# Patient Record
Sex: Male | Born: 1935 | Race: White | Hispanic: No | Marital: Married | Smoking: Former smoker
Health system: Southern US, Community
[De-identification: ages and names within clinical notes are randomized; demographics above are authoritative.]

## PROBLEM LIST (undated history)

## (undated) DIAGNOSIS — K56609 Unspecified intestinal obstruction, unspecified as to partial versus complete obstruction: Secondary | ICD-10-CM

## (undated) DIAGNOSIS — I10 Essential (primary) hypertension: Secondary | ICD-10-CM

## (undated) DIAGNOSIS — E785 Hyperlipidemia, unspecified: Secondary | ICD-10-CM

## (undated) DIAGNOSIS — D649 Anemia, unspecified: Secondary | ICD-10-CM

## (undated) DIAGNOSIS — I679 Cerebrovascular disease, unspecified: Secondary | ICD-10-CM

## (undated) DIAGNOSIS — Z8673 Personal history of transient ischemic attack (TIA), and cerebral infarction without residual deficits: Secondary | ICD-10-CM

## (undated) DIAGNOSIS — F0390 Unspecified dementia without behavioral disturbance: Secondary | ICD-10-CM

## (undated) DIAGNOSIS — I2510 Atherosclerotic heart disease of native coronary artery without angina pectoris: Secondary | ICD-10-CM

## (undated) DIAGNOSIS — I714 Abdominal aortic aneurysm, without rupture, unspecified: Secondary | ICD-10-CM

## (undated) DIAGNOSIS — I482 Chronic atrial fibrillation, unspecified: Secondary | ICD-10-CM

## (undated) DIAGNOSIS — N400 Enlarged prostate without lower urinary tract symptoms: Secondary | ICD-10-CM

## (undated) DIAGNOSIS — S065X9A Traumatic subdural hemorrhage with loss of consciousness of unspecified duration, initial encounter: Secondary | ICD-10-CM

## (undated) DIAGNOSIS — S065XAA Traumatic subdural hemorrhage with loss of consciousness status unknown, initial encounter: Secondary | ICD-10-CM

## (undated) DIAGNOSIS — Z9181 History of falling: Secondary | ICD-10-CM

## (undated) DIAGNOSIS — K253 Acute gastric ulcer without hemorrhage or perforation: Secondary | ICD-10-CM

## (undated) DIAGNOSIS — M1990 Unspecified osteoarthritis, unspecified site: Secondary | ICD-10-CM

## (undated) DIAGNOSIS — I5022 Chronic systolic (congestive) heart failure: Secondary | ICD-10-CM

## (undated) DIAGNOSIS — I441 Atrioventricular block, second degree: Secondary | ICD-10-CM

## (undated) DIAGNOSIS — G4730 Sleep apnea, unspecified: Secondary | ICD-10-CM

## (undated) HISTORY — DX: Traumatic subdural hemorrhage with loss of consciousness of unspecified duration, initial encounter: S06.5X9A

## (undated) HISTORY — DX: Unspecified osteoarthritis, unspecified site: M19.90

## (undated) HISTORY — DX: Hyperlipidemia, unspecified: E78.5

## (undated) HISTORY — DX: Abdominal aortic aneurysm, without rupture: I71.4

## (undated) HISTORY — PX: GASTRECTOMY: SHX58

## (undated) HISTORY — DX: Traumatic subdural hemorrhage with loss of consciousness status unknown, initial encounter: S06.5XAA

## (undated) HISTORY — DX: Chronic systolic (congestive) heart failure: I50.22

## (undated) HISTORY — DX: Abdominal aortic aneurysm, without rupture, unspecified: I71.40

## (undated) HISTORY — DX: Benign prostatic hyperplasia without lower urinary tract symptoms: N40.0

## (undated) HISTORY — DX: History of falling: Z91.81

## (undated) HISTORY — DX: Cerebrovascular disease, unspecified: I67.9

## (undated) HISTORY — DX: Sleep apnea, unspecified: G47.30

## (undated) HISTORY — DX: Unspecified dementia, unspecified severity, without behavioral disturbance, psychotic disturbance, mood disturbance, and anxiety: F03.90

## (undated) HISTORY — DX: Atherosclerotic heart disease of native coronary artery without angina pectoris: I25.10

## (undated) HISTORY — DX: Atrioventricular block, second degree: I44.1

## (undated) HISTORY — DX: Chronic atrial fibrillation, unspecified: I48.20

## (undated) HISTORY — PX: KNEE ARTHROSCOPY: SHX127

## (undated) HISTORY — DX: Unspecified intestinal obstruction, unspecified as to partial versus complete obstruction: K56.609

## (undated) HISTORY — DX: Essential (primary) hypertension: I10

## (undated) HISTORY — PX: APPENDECTOMY: SHX54

## (undated) HISTORY — PX: CORONARY ARTERY BYPASS GRAFT: SHX141

## (undated) HISTORY — PX: FINGER SURGERY: SHX640

## (undated) HISTORY — DX: Acute gastric ulcer without hemorrhage or perforation: K25.3

## (undated) HISTORY — DX: Personal history of transient ischemic attack (TIA), and cerebral infarction without residual deficits: Z86.73

---

## 1997-07-14 ENCOUNTER — Encounter: Payer: Self-pay | Admitting: Pulmonary Disease

## 1997-09-14 ENCOUNTER — Ambulatory Visit: Admission: RE | Admit: 1997-09-14 | Discharge: 1997-09-14 | Payer: Self-pay | Admitting: Pulmonary Disease

## 1997-09-14 ENCOUNTER — Encounter: Payer: Self-pay | Admitting: Pulmonary Disease

## 1997-10-31 ENCOUNTER — Ambulatory Visit (HOSPITAL_COMMUNITY): Admission: RE | Admit: 1997-10-31 | Discharge: 1997-10-31 | Payer: Self-pay | Admitting: Urology

## 1999-02-04 ENCOUNTER — Inpatient Hospital Stay (HOSPITAL_COMMUNITY): Admission: EM | Admit: 1999-02-04 | Discharge: 1999-02-06 | Payer: Self-pay | Admitting: Gastroenterology

## 1999-02-04 ENCOUNTER — Encounter: Payer: Self-pay | Admitting: Gastroenterology

## 1999-02-05 ENCOUNTER — Encounter: Payer: Self-pay | Admitting: Gastroenterology

## 1999-04-24 ENCOUNTER — Ambulatory Visit (HOSPITAL_COMMUNITY): Admission: RE | Admit: 1999-04-24 | Discharge: 1999-04-25 | Payer: Self-pay | Admitting: Cardiology

## 1999-06-19 ENCOUNTER — Ambulatory Visit (HOSPITAL_COMMUNITY): Admission: RE | Admit: 1999-06-19 | Discharge: 1999-06-19 | Payer: Self-pay | Admitting: Cardiovascular Disease

## 1999-06-19 ENCOUNTER — Ambulatory Visit (HOSPITAL_COMMUNITY): Admission: RE | Admit: 1999-06-19 | Discharge: 1999-06-19 | Payer: Self-pay | Admitting: Cardiology

## 1999-07-30 ENCOUNTER — Encounter: Payer: Self-pay | Admitting: Cardiothoracic Surgery

## 1999-08-03 ENCOUNTER — Encounter (INDEPENDENT_AMBULATORY_CARE_PROVIDER_SITE_OTHER): Payer: Self-pay | Admitting: *Deleted

## 1999-08-03 ENCOUNTER — Inpatient Hospital Stay (HOSPITAL_COMMUNITY): Admission: RE | Admit: 1999-08-03 | Discharge: 1999-08-08 | Payer: Self-pay | Admitting: Cardiothoracic Surgery

## 1999-08-03 ENCOUNTER — Encounter: Payer: Self-pay | Admitting: Cardiothoracic Surgery

## 1999-08-04 ENCOUNTER — Encounter: Payer: Self-pay | Admitting: Cardiothoracic Surgery

## 1999-08-05 ENCOUNTER — Encounter: Payer: Self-pay | Admitting: Cardiothoracic Surgery

## 1999-08-06 ENCOUNTER — Encounter: Payer: Self-pay | Admitting: Cardiothoracic Surgery

## 2001-05-17 ENCOUNTER — Encounter: Payer: Self-pay | Admitting: *Deleted

## 2001-05-17 ENCOUNTER — Ambulatory Visit (HOSPITAL_COMMUNITY): Admission: RE | Admit: 2001-05-17 | Discharge: 2001-05-17 | Payer: Self-pay | Admitting: *Deleted

## 2001-09-06 ENCOUNTER — Encounter: Payer: Self-pay | Admitting: Urology

## 2001-09-06 ENCOUNTER — Encounter: Admission: RE | Admit: 2001-09-06 | Discharge: 2001-09-06 | Payer: Self-pay | Admitting: Urology

## 2001-12-05 ENCOUNTER — Encounter: Payer: Self-pay | Admitting: Internal Medicine

## 2001-12-05 ENCOUNTER — Inpatient Hospital Stay (HOSPITAL_COMMUNITY): Admission: EM | Admit: 2001-12-05 | Discharge: 2001-12-08 | Payer: Self-pay | Admitting: Emergency Medicine

## 2001-12-06 ENCOUNTER — Encounter: Payer: Self-pay | Admitting: Gastroenterology

## 2001-12-07 ENCOUNTER — Encounter: Payer: Self-pay | Admitting: General Surgery

## 2001-12-08 ENCOUNTER — Encounter: Payer: Self-pay | Admitting: Gastroenterology

## 2002-01-12 ENCOUNTER — Encounter (INDEPENDENT_AMBULATORY_CARE_PROVIDER_SITE_OTHER): Payer: Self-pay | Admitting: Specialist

## 2002-01-12 ENCOUNTER — Observation Stay (HOSPITAL_COMMUNITY): Admission: RE | Admit: 2002-01-12 | Discharge: 2002-01-13 | Payer: Self-pay | Admitting: Orthopedic Surgery

## 2003-10-11 ENCOUNTER — Inpatient Hospital Stay (HOSPITAL_COMMUNITY): Admission: EM | Admit: 2003-10-11 | Discharge: 2003-10-16 | Payer: Self-pay | Admitting: Emergency Medicine

## 2004-04-17 ENCOUNTER — Ambulatory Visit: Payer: Self-pay

## 2004-04-17 ENCOUNTER — Ambulatory Visit: Payer: Self-pay | Admitting: Cardiology

## 2004-05-27 ENCOUNTER — Ambulatory Visit: Payer: Self-pay

## 2004-05-27 ENCOUNTER — Ambulatory Visit: Payer: Self-pay | Admitting: Cardiology

## 2004-09-16 ENCOUNTER — Observation Stay (HOSPITAL_COMMUNITY): Admission: EM | Admit: 2004-09-16 | Discharge: 2004-09-17 | Payer: Self-pay | Admitting: Emergency Medicine

## 2004-09-16 ENCOUNTER — Ambulatory Visit: Payer: Self-pay | Admitting: Internal Medicine

## 2004-11-12 ENCOUNTER — Ambulatory Visit: Payer: Self-pay | Admitting: Cardiology

## 2004-11-18 ENCOUNTER — Ambulatory Visit: Payer: Self-pay | Admitting: *Deleted

## 2005-07-07 ENCOUNTER — Ambulatory Visit: Payer: Self-pay

## 2005-07-07 ENCOUNTER — Ambulatory Visit: Payer: Self-pay | Admitting: Cardiology

## 2005-07-08 ENCOUNTER — Ambulatory Visit: Payer: Self-pay | Admitting: Cardiology

## 2005-09-27 ENCOUNTER — Ambulatory Visit: Payer: Self-pay | Admitting: Cardiology

## 2005-10-07 ENCOUNTER — Encounter: Admission: RE | Admit: 2005-10-07 | Discharge: 2005-10-07 | Payer: Self-pay | Admitting: *Deleted

## 2005-10-08 ENCOUNTER — Ambulatory Visit (HOSPITAL_COMMUNITY): Admission: RE | Admit: 2005-10-08 | Discharge: 2005-10-08 | Payer: Self-pay | Admitting: Orthopedic Surgery

## 2005-12-24 ENCOUNTER — Inpatient Hospital Stay (HOSPITAL_COMMUNITY): Admission: EM | Admit: 2005-12-24 | Discharge: 2005-12-25 | Payer: Self-pay | Admitting: Emergency Medicine

## 2005-12-28 ENCOUNTER — Ambulatory Visit: Payer: Self-pay | Admitting: Internal Medicine

## 2006-01-12 ENCOUNTER — Ambulatory Visit: Payer: Self-pay | Admitting: Cardiology

## 2006-01-13 ENCOUNTER — Ambulatory Visit: Payer: Self-pay

## 2006-05-05 ENCOUNTER — Encounter: Admission: RE | Admit: 2006-05-05 | Discharge: 2006-05-05 | Payer: Self-pay | Admitting: *Deleted

## 2006-06-29 ENCOUNTER — Ambulatory Visit: Payer: Self-pay

## 2006-06-29 ENCOUNTER — Ambulatory Visit: Payer: Self-pay | Admitting: Cardiology

## 2006-08-11 ENCOUNTER — Ambulatory Visit: Payer: Self-pay

## 2006-08-11 ENCOUNTER — Ambulatory Visit: Payer: Self-pay | Admitting: Cardiology

## 2006-08-11 LAB — CONVERTED CEMR LAB
CO2: 28 meq/L (ref 19–32)
Creatinine, Ser: 0.9 mg/dL (ref 0.4–1.5)
GFR calc Af Amer: 105 mL/min
HDL: 55.1 mg/dL (ref >39.0)
Potassium: 3.8 meq/L (ref 3.5–5.1)
Sodium: 141 meq/L (ref 135–145)
Total Bilirubin: 1.1 mg/dL (ref 0.3–1.2)
Total CHOL/HDL Ratio: 2.4
Total Protein: 5.9 g/dL — ABNORMAL LOW (ref 6.0–8.3)
Triglycerides: 49 mg/dL (ref 0–149)
VLDL: 10 mg/dL (ref 0–40)

## 2006-11-17 ENCOUNTER — Ambulatory Visit: Payer: Self-pay | Admitting: *Deleted

## 2006-11-17 ENCOUNTER — Encounter: Admission: RE | Admit: 2006-11-17 | Discharge: 2006-11-17 | Payer: Self-pay | Admitting: *Deleted

## 2007-01-25 ENCOUNTER — Ambulatory Visit: Payer: Self-pay

## 2007-02-15 ENCOUNTER — Ambulatory Visit: Payer: Self-pay | Admitting: Cardiology

## 2007-05-25 ENCOUNTER — Encounter: Admission: RE | Admit: 2007-05-25 | Discharge: 2007-05-25 | Payer: Self-pay | Admitting: *Deleted

## 2007-05-25 ENCOUNTER — Ambulatory Visit: Payer: Self-pay | Admitting: *Deleted

## 2007-07-27 ENCOUNTER — Ambulatory Visit: Payer: Self-pay

## 2007-07-27 LAB — CONVERTED CEMR LAB
Alkaline Phosphatase: 55 units/L (ref 39–117)
BUN: 22 mg/dL (ref 6–23)
Basophils Absolute: 0 10*3/uL (ref 0.0–0.1)
Basophils Relative: 0.4 % (ref 0.0–1.0)
CO2: 26 meq/L (ref 19–32)
Cholesterol: 122 mg/dL (ref 0–200)
Eosinophils Absolute: 0.2 10*3/uL (ref 0.0–0.6)
GFR calc Af Amer: 83 mL/min
GFR calc non Af Amer: 69 mL/min
HDL: 55.2 mg/dL (ref >39.0)
Hemoglobin: 12.6 g/dL — ABNORMAL LOW (ref 13.0–17.0)
Lymphocytes Relative: 26.2 % (ref 12.0–46.0)
MCHC: 32.3 g/dL (ref 30.0–36.0)
MCV: 94.9 fL (ref 78.0–100.0)
Monocytes Absolute: 0.8 10*3/uL — ABNORMAL HIGH (ref 0.2–0.7)
Monocytes Relative: 11.8 % — ABNORMAL HIGH (ref 3.0–11.0)
Neutro Abs: 3.7 10*3/uL (ref 1.4–7.7)
Potassium: 4.3 meq/L (ref 3.5–5.1)
TSH: 2.08 microintl units/mL (ref 0.35–5.50)
Total Protein: 6.3 g/dL (ref 6.0–8.3)
Triglycerides: 31 mg/dL (ref 0–149)

## 2007-09-06 ENCOUNTER — Ambulatory Visit: Payer: Self-pay | Admitting: Cardiology

## 2007-11-16 ENCOUNTER — Encounter: Admission: RE | Admit: 2007-11-16 | Discharge: 2007-11-16 | Payer: Self-pay | Admitting: *Deleted

## 2007-11-16 ENCOUNTER — Ambulatory Visit: Payer: Self-pay | Admitting: *Deleted

## 2008-01-19 DIAGNOSIS — M1990 Unspecified osteoarthritis, unspecified site: Secondary | ICD-10-CM | POA: Insufficient documentation

## 2008-01-19 DIAGNOSIS — I714 Abdominal aortic aneurysm, without rupture, unspecified: Secondary | ICD-10-CM | POA: Insufficient documentation

## 2008-01-19 DIAGNOSIS — I679 Cerebrovascular disease, unspecified: Secondary | ICD-10-CM

## 2008-01-19 DIAGNOSIS — I10 Essential (primary) hypertension: Secondary | ICD-10-CM | POA: Insufficient documentation

## 2008-01-19 DIAGNOSIS — Z87898 Personal history of other specified conditions: Secondary | ICD-10-CM

## 2008-01-19 DIAGNOSIS — E785 Hyperlipidemia, unspecified: Secondary | ICD-10-CM

## 2008-01-19 DIAGNOSIS — Z8679 Personal history of other diseases of the circulatory system: Secondary | ICD-10-CM | POA: Insufficient documentation

## 2008-01-22 ENCOUNTER — Ambulatory Visit: Payer: Self-pay | Admitting: Pulmonary Disease

## 2008-01-22 DIAGNOSIS — G4733 Obstructive sleep apnea (adult) (pediatric): Secondary | ICD-10-CM | POA: Insufficient documentation

## 2008-01-22 DIAGNOSIS — Z9989 Dependence on other enabling machines and devices: Secondary | ICD-10-CM

## 2008-02-20 ENCOUNTER — Ambulatory Visit: Payer: Self-pay | Admitting: Cardiology

## 2008-02-20 LAB — CONVERTED CEMR LAB
Chloride: 104 meq/L (ref 96–112)
Eosinophils Relative: 3 % (ref 0–5)
HCT: 39.8 % (ref 39.0–52.0)
INR: 1.1 (ref 0.0–1.5)
Lymphocytes Relative: 22 % (ref 12–46)
Lymphs Abs: 1.9 10*3/uL (ref 0.7–4.0)
Neutrophils Relative %: 62 % (ref 43–77)
Platelets: 167 10*3/uL (ref 150–400)
Potassium: 4.4 meq/L (ref 3.5–5.3)
Prothrombin Time: 14.7 s (ref 11.6–15.2)
RBC: 4.23 M/uL (ref 4.22–5.81)
WBC: 8.5 10*3/uL (ref 4.0–10.5)

## 2008-02-21 ENCOUNTER — Ambulatory Visit (HOSPITAL_COMMUNITY): Admission: RE | Admit: 2008-02-21 | Discharge: 2008-02-21 | Payer: Self-pay | Admitting: Cardiology

## 2008-02-21 ENCOUNTER — Ambulatory Visit: Payer: Self-pay | Admitting: Cardiology

## 2008-03-27 ENCOUNTER — Ambulatory Visit: Payer: Self-pay | Admitting: Pulmonary Disease

## 2008-05-23 ENCOUNTER — Ambulatory Visit: Payer: Self-pay | Admitting: *Deleted

## 2008-05-23 ENCOUNTER — Encounter: Admission: RE | Admit: 2008-05-23 | Discharge: 2008-05-23 | Payer: Self-pay | Admitting: *Deleted

## 2008-09-30 ENCOUNTER — Ambulatory Visit: Payer: Self-pay | Admitting: *Deleted

## 2008-11-21 ENCOUNTER — Encounter: Admission: RE | Admit: 2008-11-21 | Discharge: 2008-11-21 | Payer: Self-pay | Admitting: *Deleted

## 2008-11-21 ENCOUNTER — Ambulatory Visit: Payer: Self-pay | Admitting: *Deleted

## 2009-02-11 ENCOUNTER — Encounter: Payer: Self-pay | Admitting: Cardiology

## 2009-02-11 ENCOUNTER — Ambulatory Visit: Payer: Self-pay

## 2009-02-11 ENCOUNTER — Ambulatory Visit: Payer: Self-pay | Admitting: Cardiovascular Disease

## 2009-03-06 ENCOUNTER — Ambulatory Visit: Payer: Self-pay | Admitting: Cardiology

## 2009-03-06 DIAGNOSIS — I25810 Atherosclerosis of coronary artery bypass graft(s) without angina pectoris: Secondary | ICD-10-CM

## 2009-05-08 ENCOUNTER — Ambulatory Visit: Payer: Self-pay | Admitting: Internal Medicine

## 2009-05-08 ENCOUNTER — Telehealth: Payer: Self-pay | Admitting: Cardiology

## 2009-05-08 ENCOUNTER — Encounter: Payer: Self-pay | Admitting: Cardiology

## 2009-05-13 LAB — CONVERTED CEMR LAB
CO2: 23 meq/L (ref 19–32)
Calcium: 8.7 mg/dL (ref 8.4–10.5)
Chloride: 107 meq/L (ref 96–112)
Glucose, Bld: 96 mg/dL (ref 70–99)
Sodium: 141 meq/L (ref 135–145)

## 2009-05-14 ENCOUNTER — Ambulatory Visit: Payer: Self-pay | Admitting: Cardiology

## 2009-05-14 ENCOUNTER — Encounter: Payer: Self-pay | Admitting: Cardiology

## 2009-07-17 ENCOUNTER — Encounter: Payer: Self-pay | Admitting: Cardiology

## 2009-07-29 ENCOUNTER — Telehealth (INDEPENDENT_AMBULATORY_CARE_PROVIDER_SITE_OTHER): Payer: Self-pay | Admitting: *Deleted

## 2009-07-30 ENCOUNTER — Encounter (HOSPITAL_COMMUNITY): Admission: RE | Admit: 2009-07-30 | Discharge: 2009-10-07 | Payer: Self-pay | Admitting: Cardiology

## 2009-07-30 ENCOUNTER — Ambulatory Visit: Payer: Self-pay

## 2009-07-30 ENCOUNTER — Ambulatory Visit: Payer: Self-pay | Admitting: Cardiology

## 2009-07-30 DIAGNOSIS — I452 Bifascicular block: Secondary | ICD-10-CM | POA: Insufficient documentation

## 2009-09-12 ENCOUNTER — Encounter: Payer: Self-pay | Admitting: Cardiology

## 2009-09-15 ENCOUNTER — Telehealth (INDEPENDENT_AMBULATORY_CARE_PROVIDER_SITE_OTHER): Payer: Self-pay | Admitting: *Deleted

## 2009-09-24 ENCOUNTER — Encounter: Payer: Self-pay | Admitting: Cardiology

## 2009-12-04 ENCOUNTER — Telehealth: Payer: Self-pay | Admitting: Cardiology

## 2009-12-09 ENCOUNTER — Telehealth: Payer: Self-pay | Admitting: Cardiology

## 2010-01-06 ENCOUNTER — Encounter: Payer: Self-pay | Admitting: Cardiology

## 2010-01-14 ENCOUNTER — Ambulatory Visit: Payer: Self-pay | Admitting: Cardiovascular Disease

## 2010-01-21 ENCOUNTER — Encounter: Payer: Self-pay | Admitting: Internal Medicine

## 2010-01-23 ENCOUNTER — Ambulatory Visit: Payer: Self-pay | Admitting: Cardiology

## 2010-01-23 ENCOUNTER — Ambulatory Visit (HOSPITAL_COMMUNITY): Admission: RE | Admit: 2010-01-23 | Discharge: 2010-01-24 | Payer: Self-pay | Admitting: Internal Medicine

## 2010-01-24 HISTORY — PX: PACEMAKER INSERTION: SHX728

## 2010-01-27 ENCOUNTER — Encounter: Payer: Self-pay | Admitting: Cardiology

## 2010-01-27 ENCOUNTER — Ambulatory Visit: Payer: Self-pay | Admitting: Cardiovascular Disease

## 2010-02-03 ENCOUNTER — Ambulatory Visit: Payer: Self-pay | Admitting: Cardiology

## 2010-02-03 ENCOUNTER — Encounter: Payer: Self-pay | Admitting: Internal Medicine

## 2010-02-03 DIAGNOSIS — Z950 Presence of cardiac pacemaker: Secondary | ICD-10-CM

## 2010-02-03 DIAGNOSIS — I442 Atrioventricular block, complete: Secondary | ICD-10-CM

## 2010-03-11 ENCOUNTER — Emergency Department (HOSPITAL_COMMUNITY)
Admission: EM | Admit: 2010-03-11 | Discharge: 2010-03-11 | Payer: Self-pay | Source: Home / Self Care | Admitting: Emergency Medicine

## 2010-03-16 ENCOUNTER — Encounter: Payer: Self-pay | Admitting: Cardiology

## 2010-03-19 ENCOUNTER — Ambulatory Visit: Payer: Self-pay | Admitting: Cardiology

## 2010-03-25 ENCOUNTER — Ambulatory Visit: Admission: RE | Admit: 2010-03-25 | Discharge: 2010-03-25 | Payer: Self-pay | Admitting: Cardiology

## 2010-04-10 ENCOUNTER — Encounter: Payer: Self-pay | Admitting: Cardiology

## 2010-04-29 ENCOUNTER — Ambulatory Visit: Payer: Self-pay | Admitting: Internal Medicine

## 2010-06-23 ENCOUNTER — Telehealth: Payer: Self-pay | Admitting: Cardiology

## 2010-06-24 ENCOUNTER — Encounter: Payer: Medicare Other | Admitting: Cardiology

## 2010-06-24 ENCOUNTER — Encounter: Payer: Self-pay | Admitting: Cardiology

## 2010-06-28 ENCOUNTER — Encounter: Payer: Self-pay | Admitting: Vascular Surgery

## 2010-06-28 ENCOUNTER — Encounter: Payer: Self-pay | Admitting: *Deleted

## 2010-07-02 ENCOUNTER — Encounter: Payer: Self-pay | Admitting: Cardiology

## 2010-07-05 LAB — CONVERTED CEMR LAB
BUN: 26 mg/dL — ABNORMAL HIGH (ref 6–23)
Basophils Absolute: 0 10*3/uL (ref 0.0–0.1)
Basophils Relative: 0 % (ref 0–1)
CO2: 23 meq/L (ref 19–32)
CO2: 27 meq/L (ref 19–32)
Chloride: 104 meq/L (ref 96–112)
Cholesterol: 123 mg/dL (ref 0–200)
Creatinine, Ser: 0.91 mg/dL (ref 0.40–1.50)
Creatinine, Ser: 1 mg/dL (ref 0.4–1.5)
Eosinophils Absolute: 0.1 10*3/uL (ref 0.0–0.7)
Eosinophils Absolute: 0.3 10*3/uL (ref 0.0–0.7)
Glucose, Bld: 148 mg/dL — ABNORMAL HIGH (ref 70–99)
Glucose, Bld: 95 mg/dL (ref 70–99)
Hemoglobin: 12.5 g/dL — ABNORMAL LOW (ref 13.0–17.0)
INR: 1.2 — ABNORMAL HIGH (ref 0.8–1.0)
Lymphs Abs: 1.5 10*3/uL (ref 0.7–4.0)
MCHC: 30.9 g/dL (ref 30.0–36.0)
MCHC: 33.2 g/dL (ref 30.0–36.0)
MCV: 95.5 fL (ref 78.0–100.0)
MCV: 96.2 fL (ref 78.0–100.0)
Monocytes Absolute: 0.6 10*3/uL (ref 0.1–1.0)
Monocytes Absolute: 0.8 10*3/uL (ref 0.1–1.0)
Monocytes Relative: 10 % (ref 3–12)
Neutrophils Relative %: 69.2 % (ref 43.0–77.0)
Platelets: 150 10*3/uL (ref 150.0–400.0)
Prothrombin Time: 13.3 s — ABNORMAL HIGH (ref 9.7–11.8)
RBC: 4.2 M/uL — ABNORMAL LOW (ref 4.22–5.81)
RDW: 14.2 % (ref 11.5–14.6)
Total Bilirubin: 0.6 mg/dL (ref 0.3–1.2)
Total Protein: 6.6 g/dL (ref 6.0–8.3)
Triglycerides: 42 mg/dL (ref <150)
VLDL: 8 mg/dL (ref 0–40)
WBC: 7.9 10*3/uL (ref 4.5–10.5)

## 2010-07-08 ENCOUNTER — Encounter: Payer: Medicare Other | Admitting: Cardiology

## 2010-07-09 NOTE — Assessment & Plan Note (Signed)
Summary: 6wk f/u sl    Visit Type:  6 wk f/u Referring Provider:  Alonza Smoker Primary Provider:  Valera Castle  CC:  pt offers no cardiac complaints today....pt fell last Wed.......  History of Present Illness: Eric [name] comes in today for followup of his atrial fibrillation.  Since his pacemaker was implanted, he has remained in atrial fibrillation. His stamina and his shortness of breath with exertion have improved. He denies any presyncope or syncope.  He did have a fall last week as planned. He hit his face and hurt his right shoulder. He went to the emergency room where he was checked out. There were no fractures.  A chest x-ray was done which showed a small nodule. Followup was recommended.  He's had no problems with anticoagulation. He remains on Pradaxa.    Current Medications (verified): 1)  Aspirin 81 Mg Tbec (Aspirin) .... Take One Tablet By Mouth Daily 2)  Multivitamins   Tabs (Multiple Vitamin) .... Take 1 Tablet By Mouth Once A Day 3)  Accupril 20 Mg Tabs (Quinapril Hcl) .... Take One Tablet By Mouth Daily 4)  Norvasc 5 Mg Tabs (Amlodipine Besylate) .... Take One Tablet By Mouth Daily 5)  Zocor 20 Mg Tabs (Simvastatin) .... Take One Tablet By Mouth Daily 6)  Naprosyn 500 Mg  Tabs (Naproxen) .... Take 1 Tablet By Mouth Two Times A Day 7)  Protonix 40 Mg  Tbec (Pantoprazole Sodium) .... Take 1 Tablet By Mouth Once A Day 8)  Foltx 2.5-25-2 Mg  Tabs (Fa-Pyridoxine-Cyancobalamin) .... Take 1 Tablet By Mouth Once A Day 9)  Enablex 15 Mg Xr24h-Tab (Darifenacin Hydrobromide) .Marland Kitchen.. 1 Once Daily 10)  Pradaxa 150 Mg Caps (Dabigatran Etexilate Mesylate) .... Take 1 Tablet By Mouth Two Times A Day 11)  Finasteride 5 Mg Tabs (Finasteride) .... Take One Tablet By Mouth Daily  Allergies: 1)  ! Penicillin 2)  ! Morphine  Past History:  Past Medical History: Last updated: 01/19/2008 Current Problems:  BENIGN PROSTATIC HYPERTROPHY, HX OF (ICD-V13.8) DEGENERATIVE JOINT  DISEASE (ICD-715.90) TRANSIENT ISCHEMIC ATTACKS, HX OF (ICD-V12.50) ABDOMINAL AORTIC ANEURYSM (ICD-441.4) CEREBROVASCULAR DISEASE (ICD-437.9) HYPERLIPIDEMIA (ICD-272.4) HYPERTENSION (ICD-401.9) SINUS BRADYCARDIA (ICD-427.81) CORONARY ARTERY DISEASE (ICD-414.00)    Past Surgical History: Last updated: 02/03/2010 Pacemaker.Marland KitchenMarland KitchenAdapta Medtronic dual chamber pacemaker....8/20/1..Dr. Hillis [name] CABG Appendectomy Gastrectomy  Family History: Last updated: 03/06/2009 Family history of CAD; colon cancer   Social History: Last updated: 03/06/2009 Retired  Married  Tobacco Use - No.  Alcohol Use - no Smoking Status:  never  Review of Systems       negative other than history of present illness  Vital Signs:  Patient profile:   77 year old male Height:      70 inches Weight:      174.8 pounds BMI:     25.17 Pulse rate:   84 / minute Pulse rhythm:   irregular BP sitting:   102 / 60  (left arm) Cuff size:   large  Vitals Entered By: Danielle Rankin, CMA (March 19, 2010 1:48 PM)  Physical Exam  General:  Well developed, well nourished, in no acute distress. Head:  normocephalic and atraumatic Eyes:  left supraorbital bruise Neck:  Neck supple, no JVD. No masses, thyromegaly or abnormal cervical nodes. Lungs:  Clear bilaterally to auscultation and percussion. Heart:  PMI nondisplaced, regular rate and rhythm, normal S1-S2, carotids full Msk:  decreased ROM.   Pulses:  pulses normal in all 4 extremities Extremities:  No clubbing or cyanosis.  Neurologic:  Alert and oriented x 3. Skin:  Intact without lesions or rashes. Psych:  Normal affect.   PPM Specifications Following MD:  Eric Range, MD     PPM Vendor:  Medtronic     PPM Model Number:  [id number]     PPM Serial Number:  [id number] PPM DOI:  01/23/2010     PPM Implanting MD:  Eric Range, MD  Lead 1    Location: [facility]     DOI: 01/23/2010     Model #: [id number]     Serial #: [id number]     Status: active Lead 2    Location:  [facility]     DOI: 01/23/2010     Model #: 6213     Serial #: [id number]     Status: active  Magnet Response Rate:  BOL 85 ERI  65  Indications:  426.12   PPM Follow Up Battery Voltage:  2.80 V     Battery Est. Longevity:  10.5 YRS     Pacer Dependent:  Yes      Episodes Coumadin:  No  Parameters Mode:  DDD     Lower Rate Limit:  60     Upper Rate Limit:  130 Paced AV Delay:  180     Sensed AV Delay:  150 Next Cardiology Appt Due:  04/29/2010 Tech Comments:  INTERROGATION ONLY---PT IN AF 98.8% OF TIME.  PT TO BE SET UP FOR CARDIOVERSION. ROV 04-29-10 W/JA. Vella Kohler  Impression & Recommendations:  Problem # 1:  ATRIAL FIBRILLATION (ICD-427.31)  His pacer interrogation shows him to be in atrial fibrillation 99% of the time. I'll arrange for him to have an outpatient cardioversion. I placed a call into Dr. Johney Frame to see if we need an antiarrhythmic. This will be arranged as an outpatient next Wednesday the 19th. Indications risks and benefit has been discussed. Preprocedural blood work be drawn today. His updated medication list for this problem includes:    Aspirin 81 Mg Tbec (Aspirin) .Marland Kitchen... Take one tablet by mouth daily  Orders: EKG w/ Interpretation (93000) TLB-BMP (Basic Metabolic Panel-BMET) (80048-METABOL) TLB-CBC Platelet - w/Differential (85025-CBCD) TLB-PT (Protime) (85610-PTP) TLB-PTT (85730-PTTL) Cardioversion (Cardioversion)  Problem # 2:  PACEMAKER, PERMANENT (ICD-V45.01) Assessment: Unchanged  Problem # 3:  MOBITZ II ATRIOVENTRICULAR BLOCK (ICD-426.12)  His updated medication list for this problem includes:    Aspirin 81 Mg Tbec (Aspirin) .Marland Kitchen... Take one tablet by mouth daily    Accupril 20 Mg Tabs (Quinapril hcl) .Marland Kitchen... Take one tablet by mouth daily    Norvasc 5 Mg Tabs (Amlodipine besylate) .Marland Kitchen... 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 Take one tablet by mouth daily    Accupril 20 Mg Tabs (Quinapril hcl) .Marland Kitchen... Take one tablet by mouth daily    Norvasc 5 Mg Tabs (Amlodipine besylate) .Marland Kitchen... 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 Take one tablet by mouth daily    Accupril 20 Mg Tabs (Quinapril hcl) .Marland Kitchen... Take one tablet by mouth daily    Norvasc 5 Mg Tabs (Amlodipine besylate) .Marland Kitchen... Take one tablet by mouth daily  Patient Instructions: 1)  Your physician recommends that you schedule a follow-up appointment in: 3  weeks 2)  Your physician recommends that you continue on your current medications as directed. Please refer to the Current Medication list given to you today. 3)  Your physician has recommended that you have a cardioversion (DCCV).  Electrical cardioversion uses a jolt of electricity to your heart either through paddles or wired patches attached to your chest. This is a controlled, usually prescheduled, procedure. Defibrillation is done under light anesthesia in the hospital, and you usually go home the day of the procedure. This is done to get your heart back into a normal rhythm. You are not awake for the procedure. Please see the instruction sheet given to you today.

## 2010-07-09 NOTE — Progress Notes (Signed)
Summary: talk to nurse    Phone Note From Other Clinic Call back at [facility] Phone [phone]   Caller: Nurse Summary of Call: Per Raquel Sarna from cardiac rehab pt has a huge bruise on his arm. call pt and/or wife to see if pt needs to be seen. Nurse feels he may need to be seen.  Initial call taken by: Edman Circle,  December 09, 2009 11:00 AM  Follow-up for Phone Call        [facility]. Sherri Rad, RN, BSN  December 09, 2009 11:47 AM   Mrs. Carmean called back and stated she took Mr. Gervacio to our [facility] office and had Dr. Mariah Milling look at his arm. He did have a knot, but Dr. Mariah Milling reassured them he did not think this was a blood clot. She states Dr. Daleen Squibb has also called them from the hospital to check on them. She just wanted to let us know.  Follow-up by: Sherri Rad, RN, BSN,  December 09, 2009 12:59 PM

## 2010-07-09 NOTE — Progress Notes (Signed)
Summary: pt needs order to start PT   Phone Note From Other Clinic Call back at [phone]   Caller: [facility] PT Request: Talk with Nurse, Talk with Provider Summary of Call: pt needs a order set to [phone] in order for him to start PT on tomorrow Initial call taken by: Omer Jack,  June 23, 2010 1:04 PM  Follow-up for Phone Call        Order for PT faxed. [name] RN Follow-up by: Lisabeth Devoid RN,  June 24, 2010 3:38 PM

## 2010-07-09 NOTE — Cardiovascular Report (Signed)
Summary: Pre Op Orders   Pre Op Orders   Imported By: Roderic Ovens 01/22/2010 15:11:25  _____________________________________________________________________  External Attachment:    Type:   Image     Comment:   External Document

## 2010-07-09 NOTE — Medication Information (Signed)
Summary: Physician's Order  Physician's Order   Imported By: Roderic Ovens 11/24/2009 15:04:02  _____________________________________________________________________  External Attachment:    Type:   Image     Comment:   External Document

## 2010-07-09 NOTE — Assessment & Plan Note (Signed)
Summary: Cardiology Nuclear Study  Nuclear Med Background Indications for Stress Test: Evaluation for Ischemia, Graft Patency   History: CABG, Heart Catheterization, Myocardial Perfusion Study  History Comments: '01 re-do CABG x 3; '02 Cath:patent grafts, EF=60%;2/09 HYQ:MVHQIO, EF=60%; AAA (3.6 cm); h/o OSA   Symptoms Comments: No complaints   Nuclear Pre-Procedure Cardiac Risk Factors: Carotid Disease, CVA, Family History - CAD, History of Smoking, Hypertension, Lipids, PVD, RBBB, TIA Caffeine/Decaff Intake: None NPO After: 7:00 PM Lungs: Clear IV 0.9% NS with Angio Cath: 22g     IV Site: (R) AC IV Started by: Irean Hong RN Chest Size (in) 40     Height (in): 70 Weight (lb): 173 BMI: 24.91  Nuclear Med Study 1 or 2 day study:  1 day     Stress Test Type:  Eugenie Birks Reading MD:  Willa Rough, MD     Referring MD:  Valera Castle, MD Resting Radionuclide:  Technetium 51m Tetrofosmin     Resting Radionuclide Dose:  11.0 mCi  Stress Radionuclide:  Technetium 11m Tetrofosmin     Stress Radionuclide Dose:  33.0 mCi   Stress Protocol   Lexiscan: 0.4 mg   Stress Test Technologist:  Rea College CMA-N     Nuclear Technologist:  Burna Mortimer Deal RT-N  Rest Procedure  Myocardial perfusion imaging was performed at rest 45 minutes following the intravenous administration of Myoview Technetium 85m Tetrofosmin.  Stress Procedure  The patient initially walked the treadmill utilizing the Bruce protocol for 3:41, but was unable to keep his heart rate up.  There were occasional PAC's and questionable afib with exercise.  He then received IV Lexiscan 0.4 mg over 15-seconds.  Myoview injected at 30-seconds.  There were no significant changes with lexiscan, occasional PVC's.  Quantitative spect images were obtained after a 45 minute delay.  QPS Raw Data Images:  Normal; no motion artifact; normal heart/lung ratio. Stress Images:  No diagnostic abnormalities Rest Images:  Same as  stress Subtraction (SDS):  No evidence of ischemia. Transient Ischemic Dilatation:  1.02  (Normal <1.22)  Lung/Heart Ratio:  .28  (Normal <0.45)  Quantitative Gated Spect Images QGS EDV:  147 ml QGS ESV:  70 ml QGS EF:  52 % QGS cine images:  Mild septal dyssynergy c/w prior CABG  Findings Low risk nuclear study      Overall Impression  Exercise Capacity: Lexiscan BP Response: Normal blood pressure response. Clinical Symptoms: slight dizzy ECG Impression: No significant ST segment change suggestive of ischemia. Overall Impression: There is no scar or ischemia. There is septal dyssynergy c/w prior CABG. There is old mild apical thinning.  Appended Document: Cardiology Nuclear Study DR WALL CALLED PT WITH RESULTS.

## 2010-07-09 NOTE — Progress Notes (Signed)
   Faxed all Cardiac over to [facility] pre-opp [phone] [facility]  September 15, 2009 12:49 PM

## 2010-07-09 NOTE — Assessment & Plan Note (Signed)
Summary: rov/post myoview this morning/lg  Medications Added ENABLEX 15 MG XR24H-TAB (DARIFENACIN HYDROBROMIDE) 1 once daily        Referring Provider:  Alvie Heidelberg Annaliz Aven   History of Present Illness: Eric [name] comes in today for evaluation and management of his coronary artery disease.  Early this morning he had a stress Myoview. Images are pending. Because he was unable to achieve an adequate heart rate, we performed a Lexiscn study.  Those questions he had some atrial fib during the study. I reviewed the strips I do not see any evidence of that.  He does have a first-degree A-V block as well as a right bundle and left anterior fascicular block which as been stable. He is having no symptoms of complete heart block. He exercises on a regular basis and feels well. His biggest problem is been his low back for arthritic pain and also his knees.  Current Medications (verified): 1)  Bayer Aspirin 325 Mg  Tabs (Aspirin) .... Take 1 Tablet By Mouth Once A Day 2)  Multivitamins   Tabs (Multiple Vitamin) .... Take 1 Tablet By Mouth Once A Day 3)  Accupril 40 Mg  Tabs (Quinapril Hcl) .... Take 1 Tablet By Mouth Once A Day 4)  Norvasc 10 Mg  Tabs (Amlodipine Besylate) .... Take 1 Tablet By Mouth Once A Day 5)  Zocor 10 Mg  Tabs (Simvastatin) .... Take 1 Tablet By Mouth Once A Day 6)  Naprosyn 500 Mg  Tabs (Naproxen) .... Take 1 Tablet By Mouth Two Times A Day 7)  Protonix 40 Mg  Tbec (Pantoprazole Sodium) .... Take 1 Tablet By Mouth Once A Day 8)  Plavix 75 Mg  Tabs (Clopidogrel Bisulfate) .... Take 1 Tablet By Mouth Once A Day 9)  Zetia 10 Mg  Tabs (Ezetimibe) .... Take 1 Tablet By Mouth Once A Day 10)  Foltx 2.5-25-2 Mg  Tabs (Fa-Pyridoxine-Cyancobalamin) .... Take 1 Tablet By Mouth Once A Day 11)  Enablex 15 Mg Xr24h-Tab (Darifenacin Hydrobromide) .Marland Kitchen.. 1 Once Daily  Allergies: 1)  ! Penicillin 2)  ! Morphine  Past History:  Past Medical History: Last updated: 01/19/2008 Current  Problems:  BENIGN PROSTATIC HYPERTROPHY, HX OF (ICD-V13.8) DEGENERATIVE JOINT DISEASE (ICD-715.90) TRANSIENT ISCHEMIC ATTACKS, HX OF (ICD-V12.50) ABDOMINAL AORTIC ANEURYSM (ICD-441.4) CEREBROVASCULAR DISEASE (ICD-437.9) HYPERLIPIDEMIA (ICD-272.4) HYPERTENSION (ICD-401.9) SINUS BRADYCARDIA (ICD-427.81) CORONARY ARTERY DISEASE (ICD-414.00)    Past Surgical History: Last updated: 03/06/2009 CABG Appendectomy Gastrectomy  Family History: Last updated: 03/06/2009 Family history of CAD; colon cancer   Social History: Last updated: 03/06/2009 Retired  Married  Tobacco Use - No.  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 His updated medication list for this problem includes:    Bayer Aspirin 325 Mg Tabs (  Aspirin) .Marland Kitchen... Take 1 tablet by mouth once a day    Accupril 40 Mg Tabs (Quinapril hcl) .Marland Kitchen... Take 1 tablet by mouth once a day    Norvasc 10 Mg Tabs (Amlodipine besylate) .Marland Kitchen... Take 1 tablet by mouth once a day    Plavix 75 Mg Tabs (Clopidogrel bisulfate) .Marland Kitchen... Take 1 tablet by mouth once a day  Problem # 2:  TRANSIENT ISCHEMIC ATTACKS, HX OF (ICD-V12.50) Assessment: Improved  Problem # 3:  ABDOMINAL AORTIC ANEURYSM (ICD-441.4) Assessment: Unchanged Recent CT angiogram stable in December 2010  Problem # 4:  HYPERTENSION (ICD-401.9) Assessment: Improved  His updated medication  list for this problem includes:    Bayer Aspirin 325 Mg Tabs (Aspirin) .Marland Kitchen... Take 1 tablet by mouth once a day    Accupril 40 Mg Tabs (Quinapril hcl) .Marland Kitchen... Take 1 tablet by mouth once a day    Norvasc 10 Mg Tabs (Amlodipine besylate) .Marland Kitchen... Take 1 tablet by mouth once a day  Problem # 5:  OBSTRUCTIVE SLEEP APNEA (ICD-327.23) Assessment: Unchanged  Problem # 6:  DEGENERATIVE JOINT DISEASE (ICD-715.90) Assessment: Deteriorated he That is having a lot of lower back pain. He was given tramadol by orthopedic surgery. This really works quite well. I encouraged him to take this p.r.n. His not wanting more spinal injections.  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 Take 1 tablet by mouth once a day    Accupril 40 Mg Tabs (Quinapril hcl) .Marland Kitchen... Take 1 tablet by mouth once a day    Norvasc 10 Mg Tabs (Amlodipine besylate) .Marland Kitchen... Take 1 tablet by mouth once a day    Plavix 75 Mg Tabs (Clopidogrel bisulfate) .Marland Kitchen... Take 1 tablet by mouth once a day  Problem # 10:  AV BLOCK, 1ST DEGREE (ICD-426.11) Assessment: Unchanged  His updated medication list for this problem includes:    Bayer Aspirin 325 Mg Tabs (Aspirin) .Marland Kitchen... Take 1 tablet by mouth once a day    Accupril 40 Mg Tabs (Quinapril hcl) .Marland Kitchen... Take 1 tablet by mouth once a day    Norvasc 10 Mg Tabs (Amlodipine besylate) .Marland Kitchen... Take 1 tablet by mouth once a day    Plavix 75 Mg Tabs (Clopidogrel bisulfate) .Marland Kitchen... Take 1 tablet by mouth once a day  Patient Instructions: 1)  Your physician recommends that you schedule a follow-up appointment in: 6 MONTHS WITH DR Kedarius Aloisi 2)  Your physician recommends that you continue on your current medications as directed. Please refer to  the Current Medication list given to you today.

## 2010-07-09 NOTE — Letter (Signed)
Summary: [facility] Clear[facility] Update   [facility] Update   Imported By: Roderic Ovens 04/22/2010 12:40:44  _____________________________________________________________________  External Attachment:    Type:   Image     Comment:   External Document

## 2010-07-09 NOTE — Procedures (Signed)
Summary: Cardiology Device Clinic    Allergies: 1)  ! Penicillin 2)  ! Morphine   PPM Follow Up Remote Check?  No Battery Voltage:  2.79 V     Battery Est. Longevity:  10.5 YEARS     Pacer Dependent:  Yes       PPM Device Measurements [employer]  Amplitude: 1.4 mV, Impedance: 628 ohms,  Right Ventricle  Amplitude: 11.20 mV, Impedance: 746 ohms, Threshold: 0.5 V at 0.4 msec  Episodes MS Episodes:  11     Percent Mode Switch:  100%     Coumadin:  No Ventricular High Rate:  0     Atrial Pacing:  0.2%     Ventricular Pacing:  93.3%  Parameters Mode:  DDD     Lower Rate Limit:  60     Upper Rate Limit:  130 Paced AV Delay:  180     Sensed AV Delay:  150 Next Cardiology Appt Due:  04/07/2010 Tech Comments:  No parameter changes. Device function normal.  A-fib with controlled ventricular response, + pradaxa.   Steri strips removed, resolving bruising noted, no redness or edema.  ROV 3 months with Dr. Johney Frame. Altha Harm, LPN  February 03, 2010 4:48 PM

## 2010-07-09 NOTE — Miscellaneous (Signed)
Summary: Rehab Report  Rehab Report   Imported By: [place] Carbo 01/06/2010 13:48:27  _____________________________________________________________________  External Attachment:    Type:   Image     Comment:   External Document  Appended Document: Rehab Report Preliminarily reviewed. Forwarded to MD desktop for review and signature   Appended Document: Rehab Report spoke with Eric [name]. Asymptomatic. He is on no beta blocker or AVN slowing agent. Will follow.

## 2010-07-09 NOTE — Cardiovascular Report (Signed)
Summary: Office Visit   Office Visit   Imported By: Roderic Ovens 02/17/2010 11:57:06  _____________________________________________________________________  External Attachment:    Type:   Image     Comment:   External Document

## 2010-07-09 NOTE — Assessment & Plan Note (Signed)
Summary: EPH/JML      Allergies Added:   Visit Type:  PPM-Medtronic Referring Provider:  Alonza Smoker Primary Provider:  Valera Castle  CC:  no complaints.  History of Present Illness: The patient presents today for routine electrophysiology followup. He reports doing very well since having his pacemaker implanted.  He feels that his energy has only slightly improved.  He remains very active.  He reports occasional unsteadiness with ambulation and has fallen twice since his pacemaker was implanted.  He denies presyncope or syncope.  He recently presented for cardioversion but was in sinus rhythm at that time.  Today, he has returned to atrial flutter.  The patient denies symptoms of palpitations, chest pain, shortness of breath, orthopnea, PND,  or neurologic sequela.  He has stable mild ble edema.  The patient is tolerating medications without difficulties and is otherwise without complaint today.   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 Take One Tablet By Mouth Daily 2)  Multivitamins   Tabs (Multiple Vitamin) .... Take 1 Tablet By Mouth Once A Day 3)  Accupril 20 Mg Tabs (Quinapril Hcl) .... Take One Tablet By Mouth Daily 4)  Norvasc 5 Mg Tabs (Amlodipine Besylate) .... Take One Tablet By Mouth Daily 5)  Zocor 20 Mg Tabs (Simvastatin) .... Take  One Tablet By Mouth Daily 6)  Naprosyn 500 Mg  Tabs (Naproxen) .... Take 1 Tablet By Mouth Two Times A Day 7)  Protonix 40 Mg  Tbec (Pantoprazole Sodium) .... Take 1 Tablet By Mouth Once A Day 8)  Foltx 2.5-25-2 Mg  Tabs (Fa-Pyridoxine-Cyancobalamin) .... Take 1 Tablet By Mouth Once A Day 9)  Enablex 15 Mg Xr24h-Tab (Darifenacin Hydrobromide) .Marland Kitchen.. 1 Once Daily 10)  Pradaxa 150 Mg Caps (Dabigatran Etexilate Mesylate) .... Take 1 Tablet By Mouth Two Times A Day 11)  Finasteride 5 Mg Tabs (Finasteride) .... Take One Tablet By Mouth Daily  Current Medications (verified): 1)  Aspirin 81 Mg Tbec (Aspirin) .... Take One Tablet By Mouth Daily 2)  Multivitamins   Tabs (Multiple Vitamin) .... Take 1 Tablet By Mouth Once A Day 3)  Accupril 20 Mg Tabs (Quinapril Hcl) .... Take One Tablet By Mouth Daily 4)  Norvasc 5 Mg Tabs (Amlodipine Besylate) .... Take One Tablet By Mouth Daily 5)  Zocor 20 Mg Tabs (Simvastatin) .... Take One Tablet By Mouth Daily 6)  Naprosyn 500 Mg  Tabs (Naproxen) .... Take 1 Tablet By Mouth Two Times A Day 7)  Protonix 40 Mg  Tbec (Pantoprazole Sodium) .... Take 1 Tablet By Mouth Once A Day 8)  Foltx 2.5-25-2 Mg  Tabs (Fa-Pyridoxine-Cyancobalamin) .... Take 1 Tablet By Mouth Once A Day 9)  Enablex 15 Mg Xr24h-Tab (Darifenacin Hydrobromide) .Marland Kitchen.. 1 Once Daily 10)  Pradaxa 150 Mg Caps (Dabigatran Etexilate Mesylate) .... Take 1 Tablet By Mouth Two Times A Day 11)  Finasteride  5 Mg Tabs (Finasteride) .... Take One Tablet By Mouth Daily  Allergies (verified): 1)  ! Penicillin 2)  ! Morphine  Past History:  Past Medical History: BENIGN PROSTATIC HYPERTROPHY, HX OF (ICD-V13.8) DEGENERATIVE JOINT DISEASE (ICD-715.90) TRANSIENT ISCHEMIC ATTACKS, HX OF (ICD-V12.50) ABDOMINAL AORTIC ANEURYSM (ICD-441.4) CEREBROVASCULAR DISEASE (ICD-437.9) HYPERLIPIDEMIA (ICD-272.4) HYPERTENSION (ICD-401.9) SINUS BRADYCARDIA s/p PPM (MDT 8/11 by JA) Persistent atrial fibrillation and atrial  flutter CORONARY ARTERY DISEASE (ICD-414.00)    Past Surgical History: Reviewed history from 02/03/2010 and no changes required. Pacemaker.Marland KitchenMarland KitchenAdapta Medtronic dual chamber pacemaker....8/20/1..Dr. Hillis Range CABG Appendectomy Gastrectomy  Social History: Reviewed history from 03/06/2009 and no changes required. Retired  Married  Tobacco Use - No.  Alcohol Use - no Smoking Status:  never  Review of Systems       All systems are reviewed and negative except as listed in the HPI.   Vital Signs:  Patient profile:   77 year old male Height:      70 inches Weight:      175.75 pounds BMI:     25.31 Pulse rate:   82 / minute BP sitting:   118 / 60  (left arm) Cuff size:   regular  Vitals Entered By: Caralee Ates CMA (April 29, 2010 9:45 AM)  Physical Exam  General:  Well developed, well nourished, in no acute distress. Head:  normocephalic and atraumatic Eyes:  PERRLA/EOM intact; conjunctiva and lids normal. Mouth:  Teeth, gums and palate normal. Oral mucosa normal. Neck:  supple Chest Wall:  pacemaker pocket is well healed Lungs:  Clear bilaterally to auscultation and percussion. Heart:  RRR (paced), no m/r/g Abdomen:  Bowel sounds positive; abdomen soft and non-tender without masses, organomegaly, or hernias noted. No hepatosplenomegaly. Msk:  Back normal, normal gait. Muscle strength and tone normal. Extremities:  No clubbing or cyanosis.  trace edema Neurologic:  Alert and oriented x 3. Skin:  Intact without lesions or rashes. Psych:  Normal affect.   EKG  Procedure date:  04/29/2010  Findings:      afib,  V paced at 60 bpm  PPM Specifications Following MD:  Hillis Range, MD     PPM Vendor:  Medtronic     PPM Model Number:  [id number]     PPM Serial Number:  [id number] PPM DOI:  01/23/2010     PPM Implanting MD:  Hillis Range, MD  Lead 1    Location: RA     DOI: 01/23/2010     Model #: [id number]     Serial #: [id number]     Status: active Lead 2    Location: RV      DOI: 01/23/2010     Model #: [id number]     Serial #: [id number]     Status: active  Magnet Response Rate:  BOL 85 ERI  65  Indications:  426.12   PPM Follow Up Battery Voltage:  2.80 V     Battery Est. Longevity:  11 YRS     Pacer Dependent:  Yes       PPM Device Measurements Atrium  Amplitude: 2.80 mV, Impedance: 577 ohms,  Right Ventricle  Amplitude: 11.20 mV, Impedance: 564 ohms, Threshold: 0.750 V at 0.40 msec  Episodes MS Episodes:  56     Percent Mode Switch:  99.9%     Coumadin:  No Ventricular High Rate:  0     Atrial Pacing:  0.2%     Ventricular Pacing:  63.5%  Parameters Mode:  DDD  Lower Rate Limit:  60     Upper Rate Limit:  130 Paced AV Delay:  180     Sensed AV Delay:  150 Next Cardiology Appt Due:  10/06/2010 Tech Comments:  PT IN AF 99.9% OF TIME. + PRADAXA.  NORMAL DEVICE FUNCTION.  NO CHANGES MADE. ROV 6 MTHS W/DEVICE CLINIC.  Vella Kohler MD Comments:  agree   Impression & Recommendations:  Problem # 1:  ATRIAL FIBRILLATION (ICD-427.31) The patient has asymptomatic persistent atrial fibrillation and atrial flutter.  He is appropriately anticoagulated with pradaxa.  Pacemaker interrogation today reveals adequate ventricular rate control today.  I would recommend a rate control strategy longterm.  Problem # 2:  PACEMAKER, PERMANENT (ICD-V45.01) normal pacemaker function no changes today  Problem # 3:  MOBITZ II ATRIOVENTRICULAR BLOCK (ICD-426.12) normal pacemaker function  Problem # 4:  HYPERTENSION (ICD-401.9) stable  Patient Instructions: 1)  return in 6 months 2)  continue regular follow-up with Dr Daleen Squibb.

## 2010-07-09 NOTE — Consult Note (Signed)
Summary: [facility]   Imported By: Roderic Ovens 02/06/2010 15:57:50  _____________________________________________________________________  External Attachment:    Type:   Image     Comment:   External Document

## 2010-07-09 NOTE — Letter (Signed)
Summary: [facility] Office Note  [facility] Office Note   Imported By: Roderic Ovens 10/28/2009 13:55:04  _____________________________________________________________________  External Attachment:    Type:   Image     Comment:   External Document

## 2010-07-09 NOTE — Medication Information (Signed)
Summary: CCR/EKG/ALT  Medications Added ASPIRIN 81 MG TBEC (ASPIRIN) Take one tablet by mouth daily PRADAXA 150 MG CAPS (DABIGATRAN ETEXILATE MESYLATE) Take 1 tablet by mouth two times a day        Referring MD: Valera Castle, MD INR POC 1.1          Comments: pt is coming in for INR check to be changed to pradaxa per Dr. Daleen Squibb.   Allergies: 1)  ! Penicillin 2)  ! Morphine  Anticoagulation Management History:      Positive risk factors for bleeding include an age of 75 years or older.  The bleeding index is 'intermediate risk'.  Positive CHADS2 values include History of HTN and Age > 75 years old.  His last INR was 1.1.  INR POC: 1.1.   Prescriptions: PRADAXA 150 MG CAPS (DABIGATRAN ETEXILATE MESYLATE) Take 1 tablet by mouth two times a day  #60 x 4   Entered by:   Benedict Needy, RN   Authorized by:   Gaylord Shih, MD, [facility]   Signed by:   Benedict Needy, RN on 01/27/2010   Method used:   Electronically to        [facility]* (retail)       [address]       [place], [place]  [address]       Ph: [phone]       Fax: [phone]   RxID:   [id number]

## 2010-07-09 NOTE — Progress Notes (Signed)
Summary: talk with nurse   Phone Note From Other Clinic Call back at Home Phone [phone]   Caller: nurse carol  Summary of Call: Per Laurina Bustle RN [facility] states that pt came in this morning for excercise and had a huge black and blue mark on his arm. pt is on ASA & plavix. wants to know if pt should be concerned. [phone]           [phone] pt wife cell.  Initial call taken by: Edman Circle,  December 04, 2009 10:43 AM  Follow-up for Phone Call        lmom to call back-- Called and spoke with Okey Regal at [facility] both Mr and Mrs Laverdiere were both still at facility.  Spoke with Mrs Wyka  She stated that Dr Jenne Campus was at work out facility yesterday and said it looked okay.  She believes he has picked something up and bruised his arm on the inside and does not remember.  She is going to keep a watch on it and make sure other areas don't start coming up.  She will call back if this happens. Dennis Bast, RN, BSN  December 04, 2009 11:12 AM     Appended Document: talk with nurse Smitty Knudsen

## 2010-07-09 NOTE — Progress Notes (Signed)
Summary: Nuclear Pre-Procedure  Phone Note Outgoing Call Call back at [facility] Phone [phone]   Call placed by: Stanton Kidney, EMT-P,  July 29, 2009 2:09 PM Action Taken: Phone Call Completed Summary of Call: Left message with information on [employer] Information Sheet (see scanned document for details).     Nuclear Med Background Indications for Stress Test: Evaluation for Ischemia, Graft Patency   History: CABG, Heart Catheterization, Myocardial Perfusion Study  History Comments: '01 CABG x3 (re-do) '02 Heart Cath: EF=60%, patent graft 2/09 MPS: EF=60%, NL     Nuclear Pre-Procedure Cardiac Risk Factors: Carotid Disease, CVA, Family History - CAD, History of Smoking, Hypertension, Lipids, PVD, RBBB, TIA Height (in): 70

## 2010-07-09 NOTE — Letter (Signed)
Summary: [facility] Surgical Clearance   [facility]   Imported By: Roderic Ovens 09/18/2009 15:19:17  _____________________________________________________________________  External Attachment:    Type:   Image     Comment:   External Document

## 2010-07-09 NOTE — Assessment & Plan Note (Signed)
Summary: 6 month rov/sl  Medications Added ACCUPRIL 20 MG TABS (QUINAPRIL HCL) take one tablet by mouth daily NORVASC 5 MG TABS (AMLODIPINE BESYLATE) take one tablet by mouth daily ZOCOR 20 MG TABS (SIMVASTATIN) take one tablet by mouth daily FINASTERIDE 5 MG TABS (FINASTERIDE) take one tablet by mouth daily        Visit Type:  6 mo f/u Referring Provider:  Alonza Smoker Primary Provider:  Valera Castle  CC:  edema/ankles/legs...no other complaints today.  History of Present Illness: Eric [name] comes in today for followup of his type II second-degree A-V block, status post dual-chamber pacer by Dr. Johney Frame, newly diagnosed atrial fibrillation now on Pradaxa.  Since discharge, he is pacer site is healing up well. He is back doing exercise with his legs. He denies any soreness or drainage from the pacer.  He denies palpitations, presyncope or syncope. His dizzy spells have improved. He denies any bleeding or melena.  He still lacks stamina. He is not sleeping well waking up at about 1:00 every night. He has frequent urination. He denies any dysuria.  Interrogation of his pacer today shows rates running 60-120. He is in atrial fib. Pacemaker function normal.  Clinical Reports Reviewed:  Carotid Doppler:  07/27/2007:   Stable, mild plaque and serpentine vessels, bilaterally 0-39% bilateral ICA stenosis  01/25/2007:   Mild plaque and serpentine vessels, bilaterally, unchanges from previous exams.  0-39% bilateral  ICA stenosis.  Lipid Panel: 02/11/2009:  Chol:  123   HDL:  56   LDL:  59    07/27/2007:  Chol:  122   HDL:  55.2   LDL:  61    08/11/2006:  Chol:  130   HDL:  55.1   LDL:  65     Nuclear Study:  07/30/2009:  Excerise capacity: Lexiscan  Blood Pressure response: Normal blood pressure response  Clinical symptoms: Slight dizzy  ECG impression: No significant ST segment change suggestive of ischemia  Overall impression: There is no scar or ischemia. There is  septal dyssynergy c/w prior CABG. There is old mild apical thinning.  Eric Rough, MD, [facility]   07/27/2007:   IMPRESSION:  Exercise capacity: Fair exercise capacity  Blood Pressure response: normal blood pressure response  Clinical symptoms: no chest pain or dyspnea  ECG impression: no significant ST chenage suggestive of ischemia  Overall impression: The images are unchanged since 06/2006. There is no scar or ischemia. The exercise time is less now. Patient reached 82% predicted max heart rate.   06/29/2006:     IMPRESSION:  Exercise capacity: Fair exercise capacity  Blood Pressure response: Normal blood pressure response  Clinical symptoms: Dyspnea  ECG impression: RBBB PVC's   Overall impression: Small apical defect no ischemia. Low risk study.   Current Medications (verified): 1)  Aspirin 81 Mg Tbec (Aspirin) .... Take One Tablet By Mouth Daily 2)  Multivitamins   Tabs (Multiple Vitamin) .... Take 1 Tablet By Mouth Once A Day 3)  Accupril 40 Mg  Tabs (Quinapril Hcl) .... Take 1 Tablet By Mouth Once A Day 4)  Norvasc 10 Mg  Tabs (Amlodipine Besylate) .... Take 1 Tablet By Mouth Once A Day 5)  Zocor 10 Mg  Tabs (Simvastatin) .... Take 1 Tablet By Mouth Once A Day 6)  Naprosyn 500 Mg  Tabs (Naproxen) .... Take 1 Tablet By Mouth Two Times A Day 7)  Protonix 40 Mg  Tbec (Pantoprazole Sodium) .... Take 1 Tablet By Mouth Once A  Day 8)  Zetia 10 Mg  Tabs (Ezetimibe) .... Take 1 Tablet By Mouth Once A Day 9)  Foltx 2.5-25-2 Mg  Tabs (Fa-Pyridoxine-Cyancobalamin) .... Take 1 Tablet By Mouth Once A Day 10)  Enablex 15 Mg Xr24h-Tab (Darifenacin Hydrobromide) .Marland Kitchen.. 1 Once Daily 11)  Pradaxa 150 Mg Caps (Dabigatran Etexilate Mesylate) .... Take 1 Tablet By Mouth Two Times A Day  Allergies: 1)  ! Penicillin 2)  ! Morphine  Past History:  Past Medical History: Last updated: 01/19/2008 Current Problems:  BENIGN PROSTATIC HYPERTROPHY, HX OF (ICD-V13.8) DEGENERATIVE JOINT DISEASE  (ICD-715.90) TRANSIENT ISCHEMIC ATTACKS, HX OF (ICD-V12.50) ABDOMINAL AORTIC ANEURYSM (ICD-441.4) CEREBROVASCULAR DISEASE (ICD-437.9) HYPERLIPIDEMIA (ICD-272.4) HYPERTENSION (ICD-401.9) SINUS BRADYCARDIA (ICD-427.81) CORONARY ARTERY DISEASE (ICD-414.00)    Family History: Last updated: 03/06/2009 Family history of CAD; colon cancer   Social History: Last updated: 03/06/2009 Retired  Married  Tobacco Use - No.  Alcohol Use - no Smoking Status:  never  Past Surgical History: Pacemaker.Marland KitchenMarland KitchenAdapta Medtronic dual chamber pacemaker....8/20/1..Dr. Hillis Range CABG Appendectomy Gastrectomy  Review of Systems       negative history of present illness  Vital Signs:  Patient profile:   77 year old male Height:      70 inches Weight:      175.8 pounds BMI:     25.32 Pulse rate:   68 / minute Pulse rhythm:   regular BP sitting:   102 / 70  (right arm) Cuff size:   large  Vitals Entered By: Danielle Rankin, CMA (February 03, 2010 4:20 PM)  Physical Exam  General:  Well developed, well nourished, in no acute distress. Head:  normocephalic and atraumatic Eyes:  PERRLA/EOM intact; conjunctiva and lids normal. Neck:  Neck supple, no JVD. No masses, thyromegaly or abnormal cervical nodes. Chest Wall:  pacemaker site on her left clavicle healed, superficial ecchymoses of the left chest and in the left upper arm medially, no hematoma Lungs:  Clear bilaterally to auscultation and percussion. Heart:  PMI nondisplaced, regular rate and rhythm, no rub, carotids equal bilaterally without bruit Msk:  decreased ROM.   Pulses:  pulses normal in all 4 extremities Extremities:  No clubbing or cyanosis. Neurologic:  Alert and oriented x 3. Skin:  Intact without lesions or rashes. Psych:  Normal affect.   Problems:  Medical Problems Added: 1)  Dx of Pacemaker, Permanent  (ICD-V45.01) 2)  Dx of Mobitz II Atrioventricular Block  (ICD-426.12)  PPM Specifications Following MD:  Hillis Range,  MD     PPM Vendor:  Medtronic     PPM Model Number:  [id number]     PPM Serial Number:  [id number] PPM DOI:  01/23/2010     PPM Implanting MD:  Hillis Range, MD  Lead 1    Location: RA     DOI: 01/23/2010     Model #: [id number]     Serial #: [id number]     Status: active Lead 2    Location: RV     DOI: 01/23/2010     Model #: [id number]     Serial #: [id number]     Status: active  Magnet Response Rate:  BOL 85 ERI  65  Indications:  426.12   Impression & Recommendations:  Problem # 1:  MOBITZ II ATRIOVENTRICULAR BLOCK (ICD-426.12) Assessment Unchanged  The following medications were removed from the medication list:    Plavix 75 Mg Tabs (Clopidogrel bisulfate) .Marland Kitchen... Take 1 tablet by mouth once a day His updated medication list for this problem includes:  Aspirin 81 Mg Tbec (Aspirin) .Marland Kitchen... Take one tablet by mouth daily    Accupril 20 Mg Tabs (Quinapril hcl) .Marland Kitchen... Take one tablet by mouth daily    Norvasc 5 Mg Tabs (Amlodipine besylate) .Marland Kitchen... Take one tablet by mouth daily  Problem # 2:  PACEMAKER, PERMANENT (ICD-V45.01) Assessment: New  Problem # 3:  RBBB W/ LAFB (ICD-426.51) Assessment: Unchanged  The following medications were removed from the medication list:    Plavix 75 Mg Tabs (Clopidogrel bisulfate) .Marland Kitchen... Take 1 tablet by mouth once a day His updated medication list for this problem includes:    Aspirin 81 Mg Tbec (Aspirin) .Marland Kitchen... Take one tablet by mouth daily    Accupril 20 Mg Tabs (Quinapril hcl) .Marland Kitchen... Take one tablet by mouth daily    Norvasc 5 Mg Tabs (Amlodipine besylate) .Marland Kitchen... Take one tablet by mouth daily  Problem # 4:  CAD, AUTOLOGOUS BYPASS GRAFT (ICD-414.02) Assessment: Unchanged  The following medications were removed from the medication list:    Plavix 75 Mg Tabs (Clopidogrel bisulfate) .Marland Kitchen... Take 1 tablet by mouth once a day His updated medication list for this problem includes:    Aspirin 81 Mg Tbec (Aspirin) .Marland Kitchen... Take one tablet by mouth daily    Accupril 20 Mg  Tabs (Quinapril hcl) .Marland Kitchen... Take one tablet by mouth daily    Norvasc 5 Mg Tabs (Amlodipine besylate) .Marland Kitchen... Take one tablet by mouth daily  Problem # 5:  OBSTRUCTIVE SLEEP APNEA (ICD-327.23) Assessment: Unchanged  Problem # 6:  BENIGN PROSTATIC HYPERTROPHY, HX OF (ICD-V13.8) Assessment: Deteriorated He is having a lot of difficulty sleeping from frequent urination. I will add Proscar 5 mg a day. He'll continue with Enablex. He has been told he'll take about 4 weeks to see improvement. He has been on Flomax in the past which he became tolerant to.  Problem # 7:  TRANSIENT ISCHEMIC ATTACKS, HX OF (ICD-V12.50) Assessment: Improved  Problem # 8:  ABDOMINAL AORTIC ANEURYSM (ICD-441.4) Assessment: Unchanged  Problem # 9:  CEREBROVASCULAR DISEASE (ICD-437.9) Assessment: Unchanged  Problem # 10:  HYPERLIPIDEMIA (ICD-272.4) I will eliminate Zetia and increase his Zocor to 20 g q.h.s. The following medications were removed from the medication list:    Zetia 10 Mg Tabs (Ezetimibe) .Marland Kitchen... Take 1 tablet by mouth once a day His updated medication list for this problem includes:    Zocor 20 Mg Tabs (Simvastatin) .Marland Kitchen... Take one tablet by mouth daily  Problem # 11:  HYPERTENSION (ICD-401.9) I am concerned as pressured and running a low. I will decrease his Accupril to 20 mg per day and his Norvasc to 5 mg a day. His updated medication list for this problem includes:    Aspirin 81 Mg Tbec (Aspirin) .Marland Kitchen... Take one tablet by mouth daily    Accupril 20 Mg Tabs (Quinapril hcl) .Marland Kitchen... Take one tablet by mouth daily    Norvasc 5 Mg Tabs (Amlodipine besylate) .Marland Kitchen... Take one tablet by mouth daily  Problem # 12:  DEGENERATIVE JOINT DISEASE (ICD-715.90) Assessment: Unchanged  Patient Instructions: 1)  Your physician recommends that you schedule a follow-up appointment in: 6 weeks. Dr. wall said you can over book him if you need to do so. 2)  Your physician has recommended you make the following change in your  medication:  Decrease Amlodipine to 5 mg daily, Accupril to 20 mg daily, Increase Zocor to 20 mg daily. Stop Zetia, and start new medication Finasteride 5 mg daily. Prescriptions: FINASTERIDE 5 MG TABS (FINASTERIDE) take one  tablet by mouth daily  #30 x 6   Entered by:   Ollen Gross, RN, BSN   Authorized by:   Gaylord Shih, MD, [facility]   Signed by:   Ollen Gross, RN, BSN on 02/03/2010   Method used:   Electronically to        [facility]* (retail)       [address]       [place], [place]  [address]       Ph: [phone]       Fax: [phone]   RxID:   [id number] ZOCOR 20 MG TABS (SIMVASTATIN) take one tablet by mouth daily  #30 x 6   Entered by:   Ollen Gross, RN, BSN   Authorized by:   Gaylord Shih, MD, [facility]   Signed by:   Ollen Gross, RN, BSN on 02/03/2010   Method used:   Electronically to        [facility]* (retail)       [address]       [place], [place]  [address]       Ph: [phone]       Fax: [phone]   RxID:   [id number] ACCUPRIL 20 MG TABS (QUINAPRIL HCL) take one tablet by mouth daily  #30 x 6   Entered by:   Ollen Gross, RN, BSN   Authorized by:   Gaylord Shih, MD, [facility]   Signed by:   Ollen Gross, RN, BSN on 02/03/2010   Method used:   Electronically to        [facility]* (retail)       [address]       [place], [place]  [address]       Ph: [phone]       Fax: [phone]   RxID:   [id number] NORVASC 5 MG TABS (AMLODIPINE BESYLATE) take one tablet by mouth daily  #30 x 6   Entered by:   Ollen Gross, RN, BSN   Authorized by:   Gaylord Shih, MD, [facility]   Signed by:   Ollen Gross, RN, BSN on 02/03/2010   Method used:   Electronically to        [facility]* (retail)       [address]       [place], [place]  [address]       Ph: [phone]       Fax: [phone]   RxID:   [id number]

## 2010-07-09 NOTE — Cardiovascular Report (Signed)
Summary: Office Visit   Office Visit   Imported By: Roderic Ovens 05/12/2010 10:58:22  _____________________________________________________________________  External Attachment:    Type:   Image     Comment:   External Document

## 2010-07-09 NOTE — Miscellaneous (Signed)
  Clinical Lists Changes  Orders: Added new Referral order of Nuclear Stress Test (Nuc Stress Test) - Signed 

## 2010-07-09 NOTE — Letter (Signed)
Summary: Cardioversion/TEE Instructions  Architectural technologist, Main Office  [address]   [place], [place] [address]   Phone: [phone]  Fax: [phone]    Cardioversion  03/19/2010 MRN: [id number]  KENDALE REMBOLD [address] [place], [place]  [address]  Dear Mr. Caesar Chestnut, You are scheduled for a Cardioversion  on March 25, 2010 with Dr. Daleen Squibb   Please arrive at the [facility] at 8:00 a.m.Marland Kitchen on the day of your procedure.     -   Nothing to eat or drink after midnight except your medications with a sip of water.     -   YOU MAY TAKE ALL of your  medications with a small amount of water.       -Must have a responsible person to drive you home.    -Bring a current list of your medications and current insurance cards.   * Special Note:  Every effort is made to have your procedure done on time. Occasionally there are emergencies that present themselves at the hospital that may cause delays. Please be patient if a delay does occur.  * If you have any questions after you get home, please call the office at [phone].

## 2010-07-09 NOTE — Medication Information (Signed)
Summary: Physical Therapy Orders  Physical Therapy Orders   Imported By: Marylou Mccoy 07/02/2010 15:54:54  _____________________________________________________________________  External Attachment:    Type:   Image     Comment:   External Document

## 2010-07-09 NOTE — Letter (Signed)
Summary: [facility]  [facility]   Imported By: Marylou Mccoy 04/13/2010 14:58:02  _____________________________________________________________________  External Attachment:    Type:   Image     Comment:   External Document

## 2010-08-06 ENCOUNTER — Encounter: Payer: Medicare Other | Admitting: Cardiology

## 2010-08-10 ENCOUNTER — Encounter: Payer: Self-pay | Admitting: Cardiology

## 2010-08-13 NOTE — Letter (Signed)
Summary: [facility] - Certification  [facility] - Certification   Imported By: Marylou Mccoy 08/03/2010 11:50:46  _____________________________________________________________________  External Attachment:    Type:   Image     Comment:   External Document

## 2010-08-20 LAB — DIFFERENTIAL
Basophils Relative: 1 % (ref 0–1)
Eosinophils Absolute: 0.2 10*3/uL (ref 0.0–0.7)
Eosinophils Relative: 3 % (ref 0–5)
Neutrophils Relative %: 68 % (ref 43–77)

## 2010-08-20 LAB — POCT I-STAT, CHEM 8
BUN: 24 mg/dL — ABNORMAL HIGH (ref 6–23)
Calcium, Ion: 1.24 mmol/L (ref 1.12–1.32)
Chloride: 107 mEq/L (ref 96–112)
Glucose, Bld: 88 mg/dL (ref 70–99)
HCT: 41 % (ref 39.0–52.0)
TCO2: 26 mmol/L (ref 0–100)

## 2010-08-20 LAB — CBC
MCH: 30.7 pg (ref 26.0–34.0)
MCHC: 32.4 g/dL (ref 30.0–36.0)
MCV: 94.6 fL (ref 78.0–100.0)
Platelets: 158 10*3/uL (ref 150–400)

## 2010-08-20 LAB — PROTIME-INR
INR: 1.32 (ref 0.00–1.49)
Prothrombin Time: 16.6 seconds — ABNORMAL HIGH (ref 11.6–15.2)

## 2010-08-21 LAB — BASIC METABOLIC PANEL
BUN: 20 mg/dL (ref 6–23)
CO2: 25 mEq/L (ref 19–32)
GFR calc non Af Amer: >60 mL/min (ref >60)
Glucose, Bld: 90 mg/dL (ref 70–99)
Potassium: 4.1 mEq/L (ref 3.5–5.1)
Sodium: 137 mEq/L (ref 135–145)

## 2010-08-21 LAB — CBC
HCT: 35.2 % — ABNORMAL LOW (ref 39.0–52.0)
Hemoglobin: 11.8 g/dL — ABNORMAL LOW (ref 13.0–17.0)
MCH: 31.4 pg (ref 26.0–34.0)
MCHC: 33.5 g/dL (ref 30.0–36.0)
MCV: 93.6 fL (ref 78.0–100.0)
RDW: 13.7 % (ref 11.5–15.5)

## 2010-08-21 LAB — SURGICAL PCR SCREEN: Staphylococcus aureus: NEGATIVE

## 2010-08-25 NOTE — Letter (Signed)
Summary: [facility] - Recertification  [facility] - Recertification   Imported By: Marylou Mccoy 08/19/2010 10:02:04  _____________________________________________________________________  External Attachment:    Type:   Image     Comment:   External Document

## 2010-08-31 ENCOUNTER — Other Ambulatory Visit: Payer: Self-pay | Admitting: Cardiology

## 2010-08-31 DIAGNOSIS — I10 Essential (primary) hypertension: Secondary | ICD-10-CM

## 2010-09-03 NOTE — Telephone Encounter (Signed)
[address] °

## 2010-09-04 ENCOUNTER — Other Ambulatory Visit: Payer: Self-pay | Admitting: *Deleted

## 2010-09-04 DIAGNOSIS — I10 Essential (primary) hypertension: Secondary | ICD-10-CM

## 2010-09-04 MED ORDER — FINASTERIDE 5 MG PO TABS: 5.0000 mg | ORAL_TABLET | Freq: Every day | ORAL | Status: DC

## 2010-09-06 ENCOUNTER — Encounter: Payer: Medicare Other | Admitting: Cardiology

## 2010-09-10 ENCOUNTER — Other Ambulatory Visit: Payer: Self-pay | Admitting: Cardiology

## 2010-09-12 ENCOUNTER — Other Ambulatory Visit: Payer: Self-pay | Admitting: *Deleted

## 2010-09-12 MED ORDER — DARIFENACIN HYDROBROMIDE ER 15 MG PO TB24: 15.0000 mg | ORAL_TABLET | Freq: Every day | ORAL | Status: AC

## 2010-09-17 MED ORDER — SIMVASTATIN 20 MG PO TABS: 20.0000 mg | ORAL_TABLET | Freq: Every day | ORAL | Status: DC

## 2010-10-07 ENCOUNTER — Ambulatory Visit (INDEPENDENT_AMBULATORY_CARE_PROVIDER_SITE_OTHER): Payer: Medicare Other | Admitting: *Deleted

## 2010-10-07 DIAGNOSIS — Z950 Presence of cardiac pacemaker: Secondary | ICD-10-CM

## 2010-10-07 DIAGNOSIS — I4891 Unspecified atrial fibrillation: Secondary | ICD-10-CM

## 2010-10-07 DIAGNOSIS — I441 Atrioventricular block, second degree: Secondary | ICD-10-CM

## 2010-10-07 NOTE — Progress Notes (Signed)
Pacer check in clinic  

## 2010-10-08 ENCOUNTER — Telehealth: Payer: Self-pay | Admitting: *Deleted

## 2010-10-08 DIAGNOSIS — R0989 Other specified symptoms and signs involving the circulatory and respiratory systems: Secondary | ICD-10-CM

## 2010-10-08 DIAGNOSIS — I714 Abdominal aortic aneurysm, without rupture: Secondary | ICD-10-CM

## 2010-10-08 DIAGNOSIS — I679 Cerebrovascular disease, unspecified: Secondary | ICD-10-CM

## 2010-10-08 NOTE — Telephone Encounter (Signed)
Per Dr. Daleen Squibb and pt is aware  Schedule appt in May for ov, carotids, and abd [place] [name] RN

## 2010-10-20 NOTE — Assessment & Plan Note (Signed)
OFFICE VISIT   ZEPHAN, BEAUCHAINE  DOB:  August 05, 1925                                       11/21/2008  GNFAO#:[id number]   The patient continues to follow up with a small abdominal aortic  aneurysm and an area of plaque ulceration in his abdominal aorta.  He  underwent a CT scan today.  This reveals no change, the aneurysm  measures 3.6 cm, the area of ulceration remains stable.   The patient has been free of any symptoms.  No significant back pain or  abdominal pain.  Continues to exercise regularly.  No shortness of  breath or chest pain.   Examination reveals an 77 year old male who is alert and oriented.  BP  131/72, pulse 63 per minute, respirations 18 per minute.  His chest is  clear with equal entry bilaterally.  Normal heart sounds without  murmurs.  No carotid bruits.  Abdomen soft, nontender.  No abdominal  masses felt.  Intact femoral pulses bilaterally.   Overall, the patient remains quite stable, no change in his abdominal CT  scan.  We will plan followup again in 6 months with repeat scan.   Balinda Quails, M.D.  Electronically Signed   PGH/MEDQ  D:  11/21/2008  T:  11/22/2008  Job:  [id number]   cc:   Thomas C. Wall, MD, [facility]

## 2010-10-20 NOTE — Assessment & Plan Note (Signed)
OFFICE VISIT   Eric [name], Eric [name]  DOB:  May 18, 1926                                       11/16/2007  FBPZW#:[id number]   The patient returned to the office today for continued followup of his  AAA.  He underwent CT scan today.  This reveals favorable results with a  3.6 cm aneurysm and stable area of plaque ulceration.   The patient remains free of symptoms.  Denies abdominal pain or back  pain.  Remains quite active, exercising regularly.  No recent chest pain  or shortness of breath.   PHYSICAL EXAMINATION:  General:  He is an alert 77 year old gentleman.  No acute distress.  Vital signs:  BP 140/77, pulse 62 per minute and  regular, respirations are 18 per minute.  Abdomen:  Soft, nontender.  No  masses or organomegaly.  2+ femoral pulses bilaterally.  No ankle edema.   The patient continues to do well.  No significant change in his  abdominal aortic aneurysm and associated atherosclerotic ulcer.  Will  plan followup again in 6 months with a repeat CT scan.   Balinda Quails, M.D.  Electronically Signed   PGH/MEDQ  D:  11/16/2007  T:  11/17/2007  Job:  [id number]   cc:   Thomas C. Wall, MD, [facility]

## 2010-10-20 NOTE — Assessment & Plan Note (Signed)
OFFICE VISIT   CALLAN, NORDEN  DOB:  05-07-1926                                       05/23/2008  UEAVW#:[id number]   The patient has a small abdominal aortic aneurysm and an area of plaque  ulceration, that is followed, in his abdomen.  He underwent a CT scan  today.  This reveals continued 3.6 cm aneurysm and no change in area of  plaque ulceration.   The patient has been free of any symptoms.  Denies abdominal pain or  back pain.  Continues to exercise regularly.  No recent chest pain or  shortness of breath.   PHYSICAL EXAMINATION:  General:  A generally well-appearing 77 year old  gentleman.  Alert and oriented.  Vital Signs:  The BP is 138/71, pulse  is 66 per minute.  He is in no distress.  Abdomen:  Soft, nontender.  No  masses or organomegaly.  Normal bowel sounds without bruits.  Femoral  pulses 2+ bilaterally.   The patient continues to do well without significant change in abdominal  aortic pathology, small aneurysm and plaque ulceration.  Will plan  follow-up in 6 months with repeat CT angiogram.   Balinda Quails, M.D.  Electronically Signed   PGH/MEDQ  D:  05/23/2008  T:  05/24/2008  Job:  [id number]   cc:   Thomas C. Wall, MD, [facility]

## 2010-10-20 NOTE — Assessment & Plan Note (Signed)
[facility] OFFICE NOTE   Eric, [name]                      MRN:          [id number]  DATE:02/15/2007                            DOB:          Jul 07, 1925    Eric [name] returns today for further management of the following issues:  1. Coronary artery disease.  He is status post coronary artery bypass      grafting x2.  Redo was in 2001.  He is having no symptoms of angina      or ischemic equivalents.  Stress Myoview of June 29, 2006 shows      he exercised for 6 minutes, 30 seconds.  He developed a right      dependent left bundle.  Met level achieved was 7.7.  His EF was 59%      with small apical defect but no ischemia.  2. Sinus bradycardia with a first-degree AV block.  This has been      stable and is asymptomatic.  This has not allowed [place] to use beta      blockers.  3. Hypertension.  4. Hyperlipidemia.  His lipids were at goal on August 11, 2006.  5. Cerebrovascular disease:  Carotid Dopplers on January 25, 2007      showed nonobstructive disease in both internal carotid arteries.      He had antegrade flow in the left vertebral.  The right vertebral      was not seen. I discussed this with Missy, our technician, and she      said she just did not see it well.  She did not feel like it was      occluded.  It is interesting that his event in October where he      lost vision in both eyes had occurred since his previous Dopplers.      At that time, Dr. Dellie Burns, ophthalmologist here, obtained an MRA      of the brain and great vessels, and this was normal.  I discussed      this with Dr. Dellie Burns last week as well to confirm this.  6. Abdominal aortic aneurysm:  A recent visit with Dr. Liliane Bade      showed this to be stable.  He is followed every six months.  7. History of transient ischemic attack with a small cerebral infarct      by CT and MRI scanning in 2001.  He has had no residual  symptoms.      He is on Plavix and aspirin.  8. Degenerative joint disease:  He has been followed by Dr. Trudee Grip, who did an arthroscopy on his right knee, which has helped      tremendously.  He remains on a nonsteroidal at low dose.  9. Benign prostatic hypertrophy with bladder outlet obstruction:      Followed by Dr. Darvin Neighbours.  He is on Vesicare with some relief.   He offers no complaints today.   CURRENT MEDICATIONS:  1. Zocor 10 mg p.o. nightly.  2. Zetia  10 mg a day.  3. Foltx 2 mg a day.  4. Norvasc 10 mg a day.  5. Accupril 40 mg a day.  6. Plavix 75 mg a day.  7. Protonix 40 mg a day.  8. Naproxen 500 mg a day.  9. Vesicare 10 mg daily.  10.Enteric-coated aspirin 325 mg a day.  11.Multivitamin daily.   PHYSICAL EXAMINATION:  His blood pressure today is 122/70.  His pulse is  60 and regular.  His EKG is stable.  His weight is 180, down 2.  HEENT:  Normocephalic and atraumatic.  PERRLA.  Extraocular movements  are intact.  Sclerae are clear.  Facial symmetry is normal.  NECK:  Carotids are full without bruits.  There is no JVD.  Thyroid is  not enlarged.  Trachea is midline.  LUNGS:  Clear.  HEART:  A soft S1 and S2.  ABDOMEN:  Soft with good bowel sounds.  No midline bruit.  No  hepatomegaly.  EXTREMITIES:  No clubbing, cyanosis or edema.  Pulses are present.  NEUROLOGIC:  Grossly intact.   ASSESSMENT/PLAN:  Eric [name] is doing well.  I have made no changes in  his medical program.  We will plan on seeing back in February or March,  2009.  At that time, he will need a stress Myoview, follow-up carotid  Dopplers, and blood work.  He will continue to follow along with Dr.  Madilyn Fireman.     Thomas C. Daleen Squibb, MD, [facility]  Electronically Signed    TCW/MedQ  DD: 02/15/2007  DT: 02/15/2007  Job #: [id number]   cc:   Balinda Quails, M.D.

## 2010-10-20 NOTE — Cardiovascular Report (Signed)
NAME:  [name], [name] NO.:  [id number]   MEDICAL RECORD NO.:  [id number]          PATIENT TYPE:  OIB   LOCATION:  2899                         FACILITY:  [facility]   PHYSICIAN:  Everardo Beals. Juanda Chance, MD, FACCDATE OF BIRTH:  1925/08/21   DATE OF PROCEDURE:  02/21/2008  DATE OF DISCHARGE:                            CARDIAC CATHETERIZATION   CLINICAL HISTORY:  Eric [name] is 77 years old and had redo bypass  surgery in 2001 by Dr. Tyrone Sage.  He was restudied in 2002, at which  time, his grafts were patent.  Recently, he developed symptoms of chest  pain and was seen by Dr. Daleen Squibb who arranged for him to come in for a re-  evaluation with an angiography.   PROCEDURE:  The procedure was performed via the right femoral artery  using arterial sheath and 5-French preformed coronary catheters.  A  front-wall arterial puncture was performed, and Omnipaque contrast was  used.  An AL-1 catheter was used for injection of the vein graft to the  diagonal branch of the LAD and the marginal branch of circumflex artery.  The LIMA catheter was used for injection of the LIMA graft.  The patient  tolerated the procedure well and left the laboratory in satisfactory  condition.   RESULTS:  The aortic pressure was 155/82 with mean of 113.  Left  ventricular pressure was 155/15.   The left main coronary artery:  The left main coronary artery was  completely occluded in its origin.   The right coronary artery:  The right coronary artery is  completely  occluded near its origin.  This was a nondominant vessel.   The saphenous vein graft to the posterior descending branch of the  circumflex artery was patent.  This graft connected to the distal  portion of an old graft from his previous bypass surgery.  The old graft  was completely occluded in its proximal portion at the site of the  stent.   The saphenous vein graft to the posterolateral branch of the circumflex  artery was patent and  functioned normally.  Previously, this was  described as being to the obtuse marginal and distal circumflex artery,  but there appeared to be only 1 connection.   The free RIMA graft to the ramus branch of the circumflex artery was  patent and functioned normally.  This arose from the hood of the vein  graft to the posterolateral branch of the circumflex artery.   The vein graft to the diagonal branch  of the LAD was patent and  functioned normally.   The LIMA graft to the LAD was patent and functioned normally.  There was  80% narrowing in the very distal LAD.   The left ventriculogram:  The left ventriculogram was performed in RAO  projection showed good wall motion with no areas of hypokinesis.  The  estimated ejection fraction was 55%.   CONCLUSION:  1. Coronary artery disease, status post redo coronary bypass graft      surgery, 2001.  2. Severe native vessel disease with total occlusion of left main  coronary artery and total occlusion of the right coronary artery.  3. Patent vein graft to the posterior  descending branch of the      circumflex artery, patent vein graft to the posterolateral branch      of circumflex artery, patent free RIMA graft to the ramus branch of      the circumflex artery, patent saphenous vein graft to the diagonal      branch of the LAD, and patent LIMA graft to the LAD with 80% distal      stenosis in the LAD.  4. Normal LV wall motion with estimated ejection fraction of 55%.   RECOMMENDATIONS:  All the new grafts are patent.  There is some distal  disease in the LAD, but there is no other clear source of ischemia.  We  will plan continued medical management and secondary risk factor  modification.      Bruce Elvera Lennox Juanda Chance, MD, [facility]  Electronically Signed     BRB/MEDQ  D:  02/21/2008  T:  02/22/2008  Job:  [id number]   cc:   Thomas C. Wall, MD, [facility]

## 2010-10-20 NOTE — Assessment & Plan Note (Signed)
[facility] OFFICE NOTE   ORVILE, CORONA                      MRN:          [id number]  DATE:02/20/2008                            DOB:          1925/11/12    CHIEF COMPLAINT:  Chest hurting.   HISTORY OF PRESENT ILLNESS:  Eric [name] is an 77 year old married  white male, long-time patient of mine, who had some chest midsternal  discomfort yesterday about 6:00.  He was at rest.  He had no other  associated symptoms.  He had no symptoms of reflux or indigestion.   He came in today per my request.   His history is significant for coronary bypass grafting x2.  His last  bypass surgery was in 2001 by Dr. Ofilia Neas.  He has had annual stress  Myoviews, with the last one in February 2009 which showed an EF of 60%  with dyssynergy of the septum consistent with prior surgery.  He had no  ischemia.  He only reached 82% of his predicted heart rate.   He exercises on a regular basis and has not had any exertional symptoms.  I have asked him not to exert himself after talking to him last night.   PAST MEDICAL HISTORY:  1. SIGNIFICANT FOR INTOLERANCE TO PENICILLIN, MORPHINE.  2. He has a history of peptic ulcer disease with partial gastrectomy      followed by Dr. Barnet Pall.  He has had a couple of small bowel      obstructions requiring hospitalization but no surgery.   CURRENT MEDICATIONS:  1. Zocor 10 mg q.h.s.  2. Zetia 10 mg daily.  3. Foltx 2 mg daily.  4. Norvasc 10 mg daily.  5. Accupril 40 mg daily.  6. Plavix 75 mg daily.  7. Protonix 40 mg daily.  8. Enteric-coated aspirin 325 mg daily.  9. Multivite daily.  10.Naproxen 500 mg p.o. b.i.d.  11.Vesicare 10 mg daily.   His other problems include hypertension, hyperlipidemia, which has been  at goal for a number of years, cerebrovascular disease with his last  carotid Dopplers in February which showed nonobstructive plaque.  He has  had  previous small strokes that were picked up initially in 2001.  He  had a recent problem in 2007 where he lost vision in both eyes  temporarily.  Evaluation with an MRA showed all of the great vessels to  be normal here at [facility].   He also has a history of abdominal aortic aneurysm, is followed by Dr.  Liliane Bade who found this to be stable in June 2009.  He has a history  of degenerative joint disease followed by Dr. Ollen Gross.  He has had  previous arthroscopy of the right knee.  He has also had an injection of  his spine in the past by Dr. Ethelene Hal which helped temporarily with some  degenerative joint changes.  He has BPH with some bladder outlet  structure.  Has been followed by Dr. Darvin Neighbours, who now has him on  Vesicare.   FAMILY HISTORY:  Positive for coronary disease.   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  He is married.  He has no children.   REVIEW OF SYSTEMS:  Other than HPI is negative.  He did have some mild  loose stools this morning but no melena or hematochezia.   EXAM:  Today his blood pressure was 112/62.  His heart rate is 64 and  regular.  He is in sinus rhythm with a new right bundle.  He has some  slight ST-segment changes in II, III, and aVF, but his QRS is widened to  148 msec with a left axis shift.  I do not think these are acute  changes.   ASSESSMENT:  Recurrent chest discomfort, though atypical, in the setting  of chronic coronary disease with coronary bypass grafting x2 different  occasions.  He has had no exertional symptoms.  He had a negative stress  Myoview in February 2009.  Regardless, we need to rule out any  progressive disease or changes.  His right bundle and axis shift bother  me somewhat.   After a long discussion, we decided to proceed with an outpatient  catheterization tomorrow with Dr. Charlies Constable.  I have talk to Dr.  Juanda Chance.  Mr. Arlington will take it easy at home,  and we have reinforced to  him that if he has any recurrent symptoms to call 9-1-1.   Indications, risks, potential benefits are understood.  They agree to  proceed.  Precath labs and chest x-ray are being obtained.     Thomas C. Daleen Squibb, MD, [facility]  Electronically Signed    TCW/MedQ  DD: 02/20/2008  DT: 02/20/2008  Job #: [id number]

## 2010-10-20 NOTE — Assessment & Plan Note (Signed)
OFFICE VISIT   BUD, KAESER  DOB:  January 13, 1926                                       11/17/2006  EAVWU#:[id number]   The patient returns today for scheduled office visit regarding his small  abdominal aortic aneurysm.  This is a 6 month followup visit.  The size  of the aneurysm has not changed appreciably, measuring 3.5 cm.  There is  minimal common iliac enlargement.  He does have a posterior ulcer which  has been noted previously, this, again, remains unchanged.   The patient remains well.  No complaints of abdominal pain or back pain.  Remains very active with regular exercise.   BP is 126/70, pulse is 72 per minute, respirations 18 per minute.  His  abdomen is soft and nontender.  No palpable masses or organomegaly.  There are 2+ femoral pulses bilaterally.  No significant ankle edema.   Overall, the patient continues to do well.  No significant change in  aneurysm size or in the small ulcerated plaque within the aneurysm sac.  We will plan to follow up with him again in 6 months with continued CT  scans.   Balinda Quails, M.D.  Electronically Signed   PGH/MEDQ  D:  11/17/2006  T:  11/18/2006  Job:  [id number]   cc:   Thomas C. Wall, MD, [facility]

## 2010-10-23 NOTE — Op Note (Signed)
Eric [name], Eric [name] NO.:  [id number]   MEDICAL RECORD NO.:  [id number]                   PATIENT TYPE:  OBV   LOCATION:  0464                                 [facility]ACILITY[facility]  [facility]   PHYSICIAN:  Dionne Ano. Everlene Other, M.D.         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   PROCEDURE:  Excision of lesion, left middle finger with primary closure.   SURGEON:  Dionne Ano. Amanda Pea, M.D.   ASSISTANTOttie Glazier. Wynona Neat, P.A.-C.   COMPLICATIONS:  None.   ANESTHESIA:  Axillary block with IV sedation (1 cc of Versed).   SPECIMENS:  One.   ESTIMATED BLOOD LOSS:  Minimal.   INDICATIONS FOR PROCEDURE:  This patient is a very pleasant 77 year old male  who presents with the above mentioned diagnosis. I have counseled him in  regards to the risks and benefits of surgery including the risk of  infection, bleeding, anesthesia, damage to normal structures and failure of  the surgery to accomplish its intended goals of relieving symptoms and  restoring function. With this in mind, he desires to proceed. All questions  have been encouraged and answered preoperatively.   OPERATIVE FINDINGS:  This patient had a left middle finger dorsal mass. We  performed the excisional biopsy. I was able to obtain excellent margins  without difficulty and primarily close this. This was a deep dissection.   DESCRIPTION OF PROCEDURE:  The patient was seen by myself and anesthesia. An  axillary block was given in the holding area by Dr. Helane Rima, 1 cc of  Versed was given. He was taken to the operative suite, axilllary block was  noted to be in excellent working  fashion and he then had a Betadine scrub  and paint about the left upper extremity. Following this, arm was elevated,  tourniquet was insufflated to 350 mmHg after sterility was confirmed and  secured and the patient then underwent and excisional biopsy of the left  middle finger dorsal mass. I took care to get a 3-4 mm rim of good quality  tissue around the mass. Dissection was carried down to the peritenon region  and thus this was deep dissection. This was then removed. I tagged the  distal end and sent it for specimen to pathology. We had to __________  to  orient pathology to the findings. Following this, I then went ahead and  performed elevation of his skin edges without difficulty from the peritenon  and subcu plane. This was done up 4.0 loop magnification without difficulty.  Following this, I then went ahead and closed the area primarily after  copious irrigation and tourniquet deflation  to ensure hemostasis with  bipolar electrocautery. Following this, the patient then  underwent placement of a sterile dressing and a finger splint. He was  transferred to the recovery room in stable condition. All sponge, needle and  instrument counts were reported as correct and there were no complications.  I have discussed with the patient all issues and all questions have been  encouraged and answered.                                                Dionne Ano. Everlene Other, M.D.    Nash Mantis  D:  01/12/2002  T:  01/14/2002  Job:  [id number]   cc:   Thomas C. Daleen Squibb, M.D. [facility] T. Pleas Koch., M.D. [facility]   Sharlet Salina T. Hoxworth, M.D.  Fax: [phone]

## 2010-10-23 NOTE — Op Note (Signed)
NAME:  Eric [name], DOWN NO.:  [id number]   MEDICAL RECORD NO.:  [id number]          PATIENT TYPE:  AMB   LOCATION:  DAY                          FACILITY:  [facility]   PHYSICIAN:  Ollen Gross, M.D.    DATE OF BIRTH:  1925/09/14   DATE OF PROCEDURE:  10/08/2005  DATE OF DISCHARGE:                                 OPERATIVE REPORT   PREOPERATIVE DIAGNOSIS:  Right knee medial and lateral meniscal tear.   POSTOPERATIVE DIAGNOSIS:  Right knee medial and lateral meniscal tear.   PROCEDURE:  Right knee arthroscopy with medial and lateral meniscal  debridement.   SURGEON:  Ollen Gross, M.D.   ASSISTANT:  No assistant.   ANESTHESIA:  Local with MAC.   ESTIMATED BLOOD LOSS:  Minimal.   DRAIN:  None.   COMPLICATIONS:  None.   CONDITION:  Stable, to Recovery.   BRIEF CLINICAL NOTE:  [name] is a 77 year old male with a several-week  history of severe right knee pain.  He has some arthritic changes in the  knee and significant gout with gouty arthropathy.  He has failed a cortisone  injection.  He has had MRI showing medial meniscal tear and a question of a  lateral tear.  Given the marked pain and acute onset with mechanical  symptoms, it was decided to perform arthroscopic treatment as opposed to any  other surgical procedure.  He presents now for arthroscopic debridement.   PROCEDURE IN DETAIL:  After the successful administration of knee block, a  tourniquet was placed on the right thigh and the right lower extremity was  prepped and draped in the usual sterile fashion.  I then infiltrated the  superomedial, inferomedial and inferolateral portal sites with a total of 20  mL of 1% lidocaine.  Superomedial and inferolateral incisions were made and  in-flow cannula passed superomedial and camera passed inferolateral.  Arthroscopic visualization proceeds.  The undersurface of the patella looks  normal.  There is a significant amount of crystalline deposition  throughout  the articular cartilage and the soft tissues.  Medial and lateral gutters  are visualize and there is a large loose crystalline body in the medial  gutter.  I then made an incision from the inferomedial portal and placed a  pituitary grabber and removed the large crystalline deposits.  Flexion and  valgus forces were applied to the knee and the medial compartment is  centered.  He has a bucket-handle tear of the body and posterior horn of the  medial meniscus.  It is debrided back to a stable base with baskets and a  4.2-mm shaver.  He did have chondromalacia in the medial compartment, but no  exposed bone.  Areas where the cartilage appeared unstable were debrided  back to a stable cartilaginous base.  Intercondylar notch was visualized and  there was a tremendous amount of crystalline disease there with loose pieces  of tethered soft tissue.  These were all debrided back to normal-appearing  tissue.  Lateral compartment was entered and there is a lateral meniscal  tear with significant crystal deposition in the lateral  meniscus; this was  debrided back to a stable base and normal-appearing meniscus.  The 4.2  shaver and the baskets are used for that.  There is no evidence of any  significant chondromalacia in the lateral compartment cartilage.  The joint  is again inspected and no further loose bodies are noted.  I did seal off  the medial meniscus with the ArthroCare device.  The arthroscopic equipment  was removed from the inferior portals, which are closed with interrupted 4-0  nylon.  Twenty milliliters of 0.25% Marcaine with epinephrine were injected  through the inflow cannula and that is removed and that portal closed with  nylon.  A bulky sterile dressing was applied.  He was awakened and  transported to Recovery in stable condition.      Ollen Gross, M.D.  Electronically Signed     FA/MEDQ  D:  10/08/2005  T:  10/11/2005  Job:  [id number]

## 2010-10-23 NOTE — Consult Note (Signed)
NAME:  Eric [name], Eric [name]                         ACCOUNT NO.:  [id number]   MEDICAL RECORD NO.:  [id number]                   PATIENT TYPE:  INP   LOCATION:  0462                                 FACILITY:  [facility]   PHYSICIAN:  Angelia Mould. Derrell Lolling, M.D.             DATE OF BIRTH:  [birth year]   DATE OF CONSULTATION:  10/13/2003  DATE OF DISCHARGE:                                   CONSULTATION   REASON FOR CONSULTATION:  Small bowel obstruction.   HISTORY OF PRESENT ILLNESS:  This is a very pleasant 77 year old white man  who has had some abdominal surgery in the past.  This includes an  appendectomy many, many years ago.  This also includes a distal gastrectomy  and Billroth I anastomosis in the year 1990 by Dr. Darvin Neighbours in  [place].  This was done for peptic ulcer disease.  He had a partial small  bowel obstruction in the year 2000, for which he was hospitalized, and this  resolved with conservative management.  He has had a colonoscopy in October  of 2000 which was normal.  He apparently had an upper endoscopy in the year  2002, which looked normal, and a Billroth I anastomosis was noted.   He had been doing well until this past Friday, Oct 11, 2003, when he awoke at  2 a.m. with crampy mid-abdominal pain.  He had 3 episodes of vomiting.  He  was admitted to the hospital.  X-rays showed small bowel obstruction.  He  was feeling just fine, had no symptoms at that time, and really did not look  all that distended to his admitting physicians.  He has been here for the  past 48 hours, has not required an NG tube, has not had any more vomiting,  but his x-rays show a persistent small bowel obstruction.  I was called to  see him this morning.  A nasogastric tube has just been placed.   He denies fever or chills.  Denies blood in his bowel movement.  His last  bowel movement was about 3 days ago.  His last flatus was yesterday.  He  says this is a similar pain to his last bowel  obstruction.   PAST MEDICAL HISTORY:  1. Coronary artery disease status post coronary artery bypass grafting in     2000, and in 1995.  2. History of sleep apnea and CPAP use.  3. History of osteoarthritis and gout.  4. Hypertension.  5. Hyperlipidemia.  6. Status post subtotal gastrectomy in 1990.  7. Peptic ulcer disease.  8. Status post appendectomy many years ago.  9. History of small bowel obstruction, treated conservatively in the year     2000.   MEDICATIONS ON ADMISSION:  1. Aspirin 325 mg daily.  2. Plavix 75 mg daily.  3. Norvasc 10 mg daily.  4. Accupril 40 mg daily.  5. Zetia 10 mg daily.  6.  Celebrex 200 mg daily.  7. Zocor 10 mg daily.   ALLERGIES:  PENICILLIN causes a skin rash.   FAMILY HISTORY:  One brother with colon cancer.  Two sisters and both  parents with coronary artery disease.   SOCIAL HISTORY:  The patient is married.  He stills works full time.  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  He is very active.  No alcohol, no  tobacco.   REVIEW OF SYSTEMS:  All systems were reviewed.  They are documented on the  chart and noncontributory, except as described above.   PHYSICAL EXAMINATION:  GENERAL:  A very pleasant older gentleman who appears  quite fit for his age.  A nasogastric tube is in place.  He does not appear  to be in any distress.  VITAL SIGNS:  Temperature 98.3, heart rate 67, respiratory rate 20, blood  pressure 109/60.  HEENT:  Eyes - sclerae clear.  Extraocular movements intact.  NECK:  Supple, nontender.  No adenopathy.  No jugular venous distention.  LUNGS:  Clear to auscultation.  No chest wall tenderness.  HEART:  Regular rate and rhythm.  No murmurs, rubs, or gallops.  CHEST:  A well-healed sternotomy scar.  ABDOMEN:  Borderline distended.  Bowel sounds were hypoactive.  No rushes  heard.  Well-healed upper midline scar.  Well-healed right lower quadrant  scar.  There is no palpable mass or hernia.  He is a little bit tender   across the lower abdomen, and fairly nontender across the upper abdomen.  EXTREMITIES:  He moves all four extremities well without pain or deformity.  NEUROLOGIC:  No gross motor or sensory deficits.   LABORATORY DATA:  Hemoglobin 13.6, white blood cell count 8000, potassium  4.3, BUN 21.   X-RAY DATA:  X-rays show a high-grade partial small bowel obstruction for  the past 48 hours.   ASSESSMENT:  1. Recurrent small bowel obstruction.  No evidence of bowel compromise     clinically at this time.  The most likely cause of this could be     adhesions, and less likely, we could consider an internal hernia or     neoplasm.  2. Coronary artery disease, status post coronary artery bypass grafting.  3. Sleep apnea.  4. Status post distal gastrectomy and Billroth I anastomosis.  5. Status post appendectomy.   PLAN:  1. I think the patient should be treated conservatively with bowel rest,     nasogastric suction, and observation, at least for the next 24 hours.  We     will reassess him tomorrow morning clinically, as well as with lab work     and x-rays.  Laparotomy will be considered if he fails to resolve or     shows any signs of bowel compromise.   Will hold his aspirin and Plavix at this time.                                               Angelia Mould. Derrell Lolling, M.D.   HMI/MEDQ  D:  10/13/2003  T:  10/13/2003  Job:  [id number]   cc:   Lina Sar, M.D. [facility]   Maisie Fus C. Wall, M.D.

## 2010-10-23 NOTE — Assessment & Plan Note (Signed)
[facility]                              CARDIOLOGY OFFICE NOTE   JOFFREY, KERCE                      MRN:          [id number]  DATE:01/12/2006                            DOB:          [birth year]    Eric [name] returns today for follow-up of the following issues.  He recently  got out of the hospital with a small-bowel obstruction that resolved without  surgery.  He also had knee surgery since I last saw him with Dr. Lequita Halt.   PROBLEM LIST:  1.  Coronary artery disease.  He had a negative stress Myoview with good LV      function in January 2007.  2.  Cerebrovascular disease with nonobstructive carotid artery plaque.  3.  He has a history of transient ischemic attacks and small cerebral      infarcts by CT and MRI scanning.  He has been on Plavix and aspirin for      this.  He has had some recent problems with intermittent right hand      finger numbness.  He has had no other symptoms suggestive of stroke.  4.  Hypertension under excellent control.  5.  Hyperlipidemia under excellent control and at goal January 2007.  6.  Abdominal aortic aneurysm. He is being followed by Dr. Liliane Bade and      last social work him May 2007.  He is scheduled for follow-up again in      January 2008.  Last measurement by our ultrasound was 3.6 x 3.6.  7.  Degenerative joint disease.  He is much better since his surgery.  He      remains on Naprosyn 500 b.i.d.   CURRENT MEDICATIONS:  1.  Aspirin 325 a day.  2.  Accupril 40 mg a day.  3.  Norvasc 10 mg a day.  4.  Zocor 10 mg a day.  5.  Foltx.  6.  Plavix 75 mg a day.  7.  Protonix 40 mg a day.  8.  Naprosyn 500 b.i.d.   PHYSICAL EXAMINATION:  GENERAL APPEARANCE:  He is in no acute distress.  VITAL SIGNS:  Blood pressure 111/67, pulse 62 and regular, weight 182 and  stable.  NECK:  Carotids are full with a soft right carotid bruit.  There is no  thyromegaly.  Trachea is midline.  LUNGS:  Clear.  CARDIOVASCULAR:  Regular rate and rhythm.  ABDOMEN:  Protuberant with good bowel sounds.  There is no hepatomegaly.  There is no tenderness.  EXTREMITIES:  No clubbing, cyanosis, or edema.  Pulses are intact.  NEUROLOGIC:  Grossly intact.  There may be some slight decrease in right  hand grip.   ASSESSMENT/PLAN:  [name] is doing well.  I am concerned about the  intermittent right hand numbness.   PLAN:  1.  Carotid ultrasound.  2.  Consult with Dr. Melbourne Abts to see if there is anything else we need to do      at this time.   He will follow up in January 2008.  At that time, he will  need a stress  test, comprehensive blood work.  He will also follow up with Dr. Madilyn Fireman in  January as well.                               Thomas C. Daleen Squibb, MD, [facility]    TCW/MedQ  DD:  01/12/2006  DT:  01/12/2006  Job #:  [id number]   cc:   Balinda Quails, MD  Genene Churn. Love, MD

## 2010-10-23 NOTE — Assessment & Plan Note (Signed)
[name]                            CARDIOLOGY OFFICE NOTE   [name], [name]                      MRN:          [id number]  DATE:08/11/2006                            DOB:          June 18, 1925    [name] returns today as an add on because of some left calf pain and  edema.   He was set up after our conversation on the phone for a venous Doppler.  Preliminary images showed no DVT.   He does not remember injuring his leg.  He is quite active walking.   His meds are unchanged.  His blood pressure today is 118/70.  Pulse 70  and regular.  His weight is 180.  His leg exam shows ecchymosis at the left Achilles tendon area extending  around the lateral and medial aspects of the ankle.  He has 1 to 2+  pitting edema at the ankle and 1+ pretibial edema.  His calf muscle is  sore.  There was no obvious inflammation, redness or heat.   This is clearly an orthopedic issue.  I am concerned about the  gastrocnemius muscle and tendon.  I have called Dr. Ollen Gross, who  will see him this morning.  He is a patient of Dr. Deri Fuelling.   I have made no change in his medical program.  We did draw fasting blood  work today, which he was due, including a CBC, comprehensive metabolic  panel, lipids, TSH, PSA.   I will see him back in 6 months.     Thomas C. Daleen Squibb, MD, [facility]  Electronically Signed    TCW/MedQ  DD: 08/11/2006  DT: 08/11/2006  Job #: [id number]   cc:   Ollen Gross, M.D.

## 2010-10-23 NOTE — H&P (Signed)
NAME:  [name], Eric NO.:  [id number]   MEDICAL RECORD NO.:  [id number]                   PATIENT TYPE:  EMS   LOCATION:  ED                                   FACILITY:  [facility]   PHYSICIAN:  Lina Sar, M.D. [facility]               DATE OF BIRTH:  [birth year]   DATE OF ADMISSION:  10/11/2003  DATE OF DISCHARGE:                                HISTORY & PHYSICAL   CHIEF COMPLAINT:  Acute abdominal pain, nausea and vomiting times 14 hours.   HISTORY:  Eric [name] is a very nice 77 year old white male with history of  coronary artery disease. He is known to Dr. Juanito Doom who he considers his  primary care physician. He is status post CABG in 2001 which was a redo of  original surgery 1995.  He has a history of hypertension, sleep apnea for  which he uses a CPAP machine, status post appendectomy and subtotal  gastrectomy secondary to  peptic ulcer disease which was done in 1990.  He  does have a history of osteoarthritis and NSAID use.  He had a partial small  bowel obstruction in 2000 for which he was admitted and this resolved with  conservative management. His last colonoscopy was done in October 2000 which  was normal.   The patient had been doing well in his usual state of  health until acute  onset of crampy mid abdominal pain which awoke him from sleep at about 2  a.m. today. Since then he has had three episodes of vomiting. He says he has  not had a bowel movement today or yesterday which is unusual for him and was  having normal bowel movements prior to that time. He has not noted any  melena or hematochezia. He has had no fever or chills. He says his pain has  persisted, but is not severe and rates it a 3/10.  He says this pain feels  exactly like his pain with previous obstruction. He does complain of a  bloated, somewhat distended sensation in his mid abdomen. He is currently  seen and evaluated in the emergency department and admitted with probable  small bowel obstruction, recurrent.   LABORATORY STUDIES:  WBC 10.8, hemoglobin 15.3, hematocrit 45.7. Protime  12.7, INR of 0.9. Electrolytes are currently pending. KUB is likewise  pending.   MEDICATIONS:  1. Aspirin 325 mg daily.  2. Plavix 75 mg daily.  3. Norvasc 10 mg daily.  4. Accupril 40 mg daily.  5. Zetia 10 mg daily.  6. Celebrex 200 mg daily.  7. Zocor 10 mg daily.   ALLERGIES:  PENICILLIN which causes rash.   PAST MEDICAL HISTORY:  As outlined above; also with osteoarthritis and  hyperlipidemia.   FAMILY HISTORY:  One brother with colon cancer, two brothers and both  parents with coronary artery disease.   SOCIAL HISTORY:  The patient is married. 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 He is very active. No ETOH. No tobacco.   REVIEW OF SYSTEMS:  CARDIOVASCULAR: Denies any chest pain or angina.  PULMONARY:  Denies cough, shortness of breath, or sputum production.  GENITOURINARY: Denies any dysuria, urgency, or frequency. MUSCULOSKELETAL:  Pertinent for rather diffuse arthritic complaints.   PHYSICAL EXAMINATION:  GENERAL: A well-developed white male who looks  younger than his stated age. He is alert and oriented times three,  accompanied by his wife.  VITAL SIGNS: Temperature 97.8, blood pressure 130/8, pulse in the 70s,  respirations 20.  HEENT: Normocephalic and atraumatic. EOMI. PERRLA. Sclerae anicteric.  NECK: Supple. There is no JVD. No bruits.  CARDIOVASCULAR: Regular rate and rhythm with S1 and S2. No murmurs, rubs, or  gallops.  PULMONARY: Clear to A&P. He does have a sternal incisional scar.  ABDOMEN: Slightly distended. Bowel sounds are present, but hypoactive. There  is no rush heard at this time. There is midline and lower abdominal  incisional scars. He is tender in the mid abdomen. There is no rebound,  mass, or hepatosplenomegaly.  RECTAL EXAM: Not done. The patient is examined in the hallway in the  emergency room.   EXTREMITIES: Without clubbing, cyanosis, or edema. Pulses are intact.  NEUROLOGIC: Nonfocal.   IMPRESSION:  1. A 77 year old white male with acute crampy abdominal pain, nausea, and     vomiting. Rule out recurrent small bowel obstruction. Less likely     gallbladder disease or peptic ulcer disease with partial outlet     obstruction.  2. History of small bowel obstruction in 2000.  3. Status post subtotal gastrectomy.  4. Peptic ulcer disease in 1990.  5. Status post appendectomy.  6. Coronary artery disease, status post CABG in 2001 and 1995.  7. History of sleep apnea and CPAP use.  8. History of osteoarthritis and gout.  9. Hypertension.  10.      Hyperlipidemia.   PLAN:  The patient is admitted for IV fluid hydration. He will be kept NPO.  He may need NG decompression depending on his course. If his plain abdominal  films are not diagnostic will need CT scan of the abdomen and pelvis. He  will covered with IV Protonix. Pain medication as needed. Will hold his  aspirin, Plavix, and Celebrex for now. For further details, please see  orders.     Mike Gip, P.A.-C. [facility]                Lina Sar, M.D. [facility]    AE/MEDQ  D:  10/11/2003  T:  10/11/2003  Job:  [id number]   cc:   Thomas C. Wall, M.D.

## 2010-10-23 NOTE — Discharge Summary (Signed)
NAME:  Eric [name], Eric [name]                         ACCOUNT NO.:  [id number]   MEDICAL RECORD NO.:  [id number]                   PATIENT TYPE:  INP   LOCATION:  0462                                 FACILITY:  [facility]   PHYSICIAN:  Wilhemina Bonito. Marina Goodell, M.D. [facility]             DATE OF BIRTH:  21-Sep-1925   DATE OF ADMISSION:  10/11/2003  DATE OF DISCHARGE:  10/16/2003                                 DISCHARGE SUMMARY   ADMISSION DIAGNOSES:  15. A 77 year old white male with acute crampy abdominal pain, nausea and     vomiting, rule out recurrent small bowel obstruction less likely     gallbladder disease or peptic ulcer disease with partial outlet     obstruction.  2. History of small bowel obstruction in 2000 resolved with conservative     management.  3. Status post subtotal gastrectomy secondary to peptic ulcer disease, 1990.  4. Status post appendectomy.  5. Coronary artery disease status post coronary artery bypass graft 2001 and     1995.  6. History of sleep apnea with CPAP use.  7. Osteoarthritis and gout.  8. Hypertension.  9. Hyperlipidemia.   DISCHARGE DIAGNOSES:  1. Resolved small bowel obstruction secondary to adhesions.  2. History of small bowel obstruction in 2000 resolved with conservative     management.  3. Status post subtotal gastrectomy secondary to peptic ulcer disease, 1990.  4. Status post appendectomy.  5. Coronary artery disease status post coronary artery bypass graft 2001 and     1995.  6. History of sleep apnea with CPAP use.  7. Osteoarthritis and gout.  8. Hypertension.  9. Hyperlipidemia.   CONSULTATIONS:  Surgery, Angelia Mould. Derrell Lolling, M.D.   PROCEDURES:  Plain abdominal films.   BRIEF HISTORY:  Mr. Fenter is a very nice 77 year old white male known to Dr.  Juanito Doom who is his cardiologist and primary physician and Dr. Arlyce Dice.  He  has a history as described above. He was hospitalized by the GI service in  2000 with a partial small bowel obstruction which did  resolve with NG  decompression.  He has had colonoscopies done per family history of colon  cancer.  His last procedure was in October of 2000 and this was a normal  exam.  The patient relates that he was doing well and in his usual state of  health until he had acute onset of crampy mid abdominal pain which awoke him  from sleep at 2 a.m. on the day of admission. He had three episodes of  vomiting since that time, had been unable to have a bowel movement or pass  flatus and did not have a bowel movement the day of admission which is  abnormal for him.  He has not noted any recent melena or hematochezia, he  has had no fever or chills.  He says his pain is persisting but is not  severe. He rates  it a 3/10 when evaluated in the emergency room but says  that it feels exactly like the pain he had previously with obstruction. He  complains of a bloated distended mid abdominal pain.  He was admitted via  the emergency room for further diagnostic workup and supportive management.   LABORATORY DATA:  On admission, WBC of 10.8, hemoglobin 15.3, hematocrit of  45.7, MCV of 91.6, platelets 179, serial values were obtained on May 10. WBC  of 6.4, hemoglobin 13.3, hematocrit of 39.1, platelets 137.  Pro time 12.7,  INR of 0.9.  Electrolytes on admission within normal limits with a potassium  of 4.6, BUN was slightly elevated at 24, creatinine 1.2, albumin 4.6.  Followup on May 10, BUN 9, creatinine 0.9, liver functions studies normal,  lipase was 23.   X-ray studies:  Multi abdomen done on admission shows dilated loops of small  bowel in the upper and left abdomen consistent with small bowel obstruction.  He has stable cardiomegaly and bibasilar scarring. Followup on May 7, no  significant change in pattern of small bowel obstruction. Followup on May 9,  no significant change in small bowel obstruction or retained fecal material.  Followup on May 10, marked improvement with resolution of previously  noted  small bowel obstruction.   HOSPITAL COURSE:  The patient was admitted to the service of Dr. Lina Sar  who was covering the hospital.  He was initially placed on IV fluids, kept  n.p.o. and given Demerol and Phenergan for control of pain and nausea. He  was also started empirically on IV Protonix and we held his aspirin and  Plavix in the event that he may require surgery.  Initial abdominal films as  outlined above were consistent with small bowel obstruction, this was felt  secondary to adhesions. On May 7, he was feeling better, had had results  with Dulcolax suppositories, had not had any vomiting but his abdominal  films showed persistent obstruction.  An NG was placed for decompression on  the 8th when he again had persistent obstructive pattern on plain films.  At  that point, we also had Dr. Claud Kelp see him for a surgical consult.  He agreed with our current management and planned to observe him for another  24 hours or so with NG decompression. The patient was fairly miserable with  the NG tube but his abdominal pain gradually subsided. We did give him some  suppositories and enemas and he had good results.  On the morning of the  9th, he had several bowel movements on his own and felt much better  thereafter.  Plain abdominal films done on May 10 showed resolution of his  small bowel obstruction and clinically he had resolved as well.  His NG was  decompressed, he was started on a liquid diet and then gradually advanced  over the next 24 hours.  By the morning of May 11, he was tolerating a low  residue diet without difficulty and had no residual complaints of abdominal  discomfort or distention.  He was felt stable for discharge and was allowed  discharge to home with instructions to followup with Dr. Daleen Squibb who is his  primary physician and to followup with Dr. Arlyce Dice on a p.r.n. basis. He  should have followup colonoscopy towards the end of this year as he  does have family history of colon cancer in a sibling.  He is to resume all of  his usual medications:   1. Plavix  75 mg q.d.  2. Aspirin 325 q.d.  3. Norvasc 10 mg q.d.  4. Accupril 40 q.d.  5. Zetia 10 mg q.d.  6. Celebrex 200 q.d.  7. Zocor 10 mg q.d.  8. We have added Protonix 40 mg q.d. for gastric protection given his     aspirin and NSAID use chronically.   DIET:  Low residue for the next couple of days with gradual advancement to  regular.   He was advised to call Dr. Arlyce Dice as needed for any recurrent abdominal  pain.     Amy Esterwood, P.A.-C. [facility]                John N. Marina Goodell, M.D. [facility]    AE/MEDQ  D:  10/16/2003  T:  10/16/2003  Job:  [id number]   cc:   Thomas C. Wall, M.D.   Angelia Mould. Derrell Lolling, M.D.  [address]  [place]  [place] [address]  Fax: [phone]

## 2010-10-23 NOTE — Discharge Summary (Signed)
[facility]  Patient:    Eric [name], Eric [name] Visit Number: [id number] MRN: [id number]          Service Type: MED Location: 3W [phone] Attending Physician:  Starr Sinclair Dictated by:   Sammuel Cooper, P.A.-C. Admit Date:  12/05/2001 Discharge Date: 12/08/2001   CC:         Thomas C. Daleen Squibb, M.D. [facility] T. Hoxworth, M.D.   Discharge Summary  ADMITTING DIAGNOSES: 23. A 77 year old, white male with recurrent, partial small bowel obstruction,    likely secondary to adhesions. 2. History of small bowel obstruction in 2000, resolved spontaneously. 3. Status post partial gastrectomy in the 1980s, for a peptic ulcer disease    and remote appendectomy. 4. Coronary artery disease, status post coronary artery bypass graft in 1994,    and redo coronary artery bypass graft in 2001. 5. Hypertension. 6. Hyperlipidemia. 7. Osteoarthritis. 8. Known small abdominal aortic aneurysm.  DISCHARGE DIAGNOSES: 1. Recurrent partial small bowel obstruction, resolved. 2. History of small bowel obstruction in 2000, resolved spontaneously. 3. Status post partial gastrectomy in the 1980s, for a peptic ulcer disease    and remote appendectomy. 4. Coronary artery disease, status post coronary artery bypass graft in 1994,    and redo coronary artery bypass graft in 2001. 5. Hypertension. 6. Hyperlipidemia. 7. Osteoarthritis. 8. Known small abdominal aortic aneurysm.  CONSULTATION:  Dr. Johna Sheriff, surgery.  PROCEDURE:  Plain abdominal films.  HISTORY OF PRESENT ILLNESS:  The patient is a very nice, 77 year old, white male known to Dr. Daleen Squibb and also to Dr. Melvia Heaps with history as described above.  The patient is status post remote appendectomy and had partial gastrectomy approximately 15 years ago in [place], [place], which was done for peptic ulcer disease with hemorrhage.  The patient had an admission here in 2000, for a partial small bowel  obstruction which resolved spontaneously.  At this time, he presents with acute onset at 2 a.m. this morning with upper abdominal pain, poorly localized and now more diffuse.  He describes it as being crampy in nature.  This has been associated with abdominal distention, one episode of nausea and vomiting and dry heaves.  The patient had not noted any diarrhea, melena or hematochezia.  He had a small bowel movement during the night.  His pain had persisted and had become progressive and he came to the emergency room for evaluation.  Abdominal films are consistent with a partial small bowel obstruction and with air fluid levels in the small bowel.  There was no evidence of free air.  The patient was admitted to the hospital for medical management with NG decompression and will also obtain surgical consultation.  LABORATORY DATA AND X-RAY FINDINGS:  On December 05, 2001, WBC of 9.6, hemoglobin 14.7, hematocrit 43.2, MCV 92.3, platelets 174.  Serial values were obtained on July 3, with WBC 7.5, hemoglobin 14.5, hematocrit 42.7.  PT 13.1, INR 0.9. Electrolytes within normal limits, glucose 164 on admission.  Serial values again were obtained on July 3, with glucose 124, BUN 10, creatinine 1.1, albumin 4.6.  Liver function studies within normal limits.  Amylase 54, lipase 18 on admission.  Hemoglobin A1C done on July 2, was normal at 5.6.  EKG showed a normal sinus rhythm.  Abdominal films on July 1, showed chest x-ray negative, significant air fluid levels in the small bowel consistent with partial obstruction.  Follow up on July 2, showed continued small bowel obstruction with slight  improvement in gas pattern.  Follow up on July 3, with improved gas pattern.  Follow up on July 4, with removal of NG tube and normal gas pattern.  HOSPITAL COURSE:  The patient was admitted to the service of Dr. Claudette Head who was covering the hospital.  He was initially kept NPO and started on IV fluids.  He  was given Demerol and Phenergan as needed for pain and nausea. The patient was started on IV Protonix prophylactically and NG tube was placed.  We did obtain surgical consultation as this was a recurrent obstruction and the patient was followed by Dr. Johna Sheriff.  He agreed with conservative management.  The patient gradually improved.  By July 2, his films continued to show partial obstruction, but he was feeling significantly better.  On July 3, his NG tube was discontinued.  His x-rays continued to show improvement.  He was started on clear liquids.  On July 4, the patient was up feeling well.  He had no problems with diet advancement.  He had no complaints of abdominal pain or distention and was felt stable for discharge to home.  FOLLOWUP:  He was to follow up with Dr. Daleen Squibb who sees him as a primary patient as per regularly scheduled.  Call Dr. Arlyce Dice as needed for GI problems.  DISCHARGE MEDICATIONS: 1. Ecotrin 325 mg q.d. 2. Plavix 75 mg q.d. 3. Norvasc 10 mg p.o. q.d. 4. Accupril 40 mg p.o. q.d. 5. Celebrex 200 mg q.d. 6. Zocor 10 mg q.d. 7. Folic acid 2.5 mg q.d. 8. Multivitamins daily.  DIET:  Regular, low fat diet.  CONDITION ON DISCHARGE:  Stable and improved. Dictated by:   Sammuel Cooper, P.A.-C. Attending Physician:  Starr Sinclair DD:  12/18/01 TD:  12/20/01 Job: [id number]

## 2010-10-23 NOTE — H&P (Signed)
[facility]  Patient:    Eric [name], Eric [name] Visit Number: [id number] MRN: [id number]          Service Type: MED Location: 3W [phone] Attending Physician:  Starr Sinclair Dictated by:   Sammuel Cooper, P.A. Admit Date:  12/05/2001   CC:         Thomas C. Daleen Squibb, M.D. [facility] T. Hoxworth, M.D.   History and Physical  CHIEF COMPLAINT:  Abdominal pain, nausea and vomiting.  HISTORY OF PRESENT ILLNESS:  Eric [name] is a pleasant 77 year old white male well-known to Dr. Daleen Squibb, also known to Dr. Arlyce Dice, who has a history of coronary artery disease.  He is status post CABG in 1994 and redo CABG in 2001.  He has a history o hypertension, hyperlipidemia, known small abdominal aortic aneurysm, which measured 3.2 x 2.2 cm in April 2003.  He is status post remote appendectomy and has had a partial gastrectomy approximately 15 years ago in  [place], [place], which was done for peptic ulcer disease with bleed.  The patient had an admission in 2000 for a partial small-bowel obstruction, which did resolve spontaneously.  At this time the patient presents with acute onset at 2 a.m. this morning with upper abdominal pain, which was poorly localized and is now more diffuse.  He describes this pain as being "crampy" in nature.  He has had some associated abdominal distention, one episode of nausea and vomiting at home, without heme, and has had some dry heaves.  No diarrhea or melena.  He did have a small bowel movement during the night.  His pain has persisted, has become somewhat progressive, and he comes to the emergency room for evaluation.  On admission he is hemodynamically stable, afebrile, with rather diffuse abdominal tenderness and a quiet abdomen.  He is admitted for supportive management and further diagnostic evaluation.  LABORATORY DATA:  On admission, WBC of 9.6, hemoglobin 14.7, hematocrit of 43.2, MCV of 92.  PT of 13.1, INR of  0.9, PTT of 27.  Electrolytes within normal limits, potassium 4.4.  Liver function studies normal.  Amylase at 54. KUB shows partial small-bowel obstruction with air-fluid levels.  There is no free air.  MEDICATIONS:  Ecotrin 325 mg q.d., Plavix 75 mg q.d., Norvasc 10 mg q.d., Accupril 40 mg q.d., Celebrex 200 mg q.d., Zocor 10 mg q.d., Folic TX 2.5 mg q.d., and a multivitamin daily.  ALLERGIES:  PENICILLIN, which causes a rash, and MORPHINE, which causes confusion.  He also has a question of HEPARIN-INDUCED THROMBOCYTOPENIA at the time of his previous CABG.  PAST MEDICAL HISTORY:  As above.  FAMILY HISTORY:  Pertinent for coronary artery disease.  SOCIAL HISTORY:  The patient is married.  He owns a hosiery business, [employer].  He is an ex-smoker, no regular ETOH.  REVIEW OF SYSTEMS:  CARDIOVASCULAR, PULMONARY, GENITOURINARY:  All reviewed and negative.  MUSCULOSKELETAL:  Pertinent for arthritic symptoms. GASTROINTESTINAL:  As above.  PHYSICAL EXAMINATION:  VITAL SIGNS:  Temperature is 97.4, blood pressure 141/84, pulse is 70.  GENERAL:  A well-developed white male in no acute distress.  He does appear uncomfortable.  He is alert and oriented x3.  HEENT:  Nontraumatic, normocephalic, EOMI, PERRLA.  Sclerae anicteric.  NECK:  Supple without nodes, no JVD, no bruit.  CHEST:  He has a sternal incisional scar.  Pulmonary clear to A&P.  CARDIAC:  Regular rate and rhythm with S1 and S2.  ABDOMEN:  Soft.  Bowel sounds are present but quite hypoactive.  He is rather diffusely tender with early rebound, bilateral lower quadrants.  There is no mass or hepatosplenomegaly.  He does have a midline incisional scar and an appendectomy scar.  RECTAL:  Not done at this time.  EXTREMITIES:  Pulses are 2+ and equal.  There is no edema.  IMPRESSION: 45. A 77 year old white male with recurrent partial small-bowel obstruction,    likely secondary to adhesions. 2. History of  small-bowel obstruction in 2000, resolved spontaneously. 3. Status post partial gastrectomy in the 1980s for peptic ulcer disease and    remote appendectomy. 4. Coronary artery disease, status post coronary artery bypass graft in 1994    and redo 2001. 5. Hypertension. 6. Hyperlipidemia. 7. Osteoarthritis. 8. Known small abdominal aortic aneurysm.  PLAN:  The patient is admitted to the service of Dr. Claudette Head for IV fluid hydration.  He will be kept NPO.  Will plan to place NG for decompression.  Hold his aspirin and Plavix in the event that he may require an exploratory laparotomy.  Will repeat abdominal films in a.m.  He will be covered with an IV PPI.  Will also obtain a surgical consultation.  For details, please see the orders.Dictated by:   Sammuel Cooper, P.A.  Attending Physician:  Starr Sinclair DD:  12/05/01 TD:  12/06/01 Job: [id number]

## 2010-10-23 NOTE — Assessment & Plan Note (Signed)
[facility]                            CARDIOLOGY OFFICE NOTE   TORON, BOWRING                      MRN:          [id number]  DATE:06/29/2006                            DOB:          09/05/1925    [name] returns today for further management of his coronary artery  disease, hyperlipidemia, hypertension, history of cerebrovascular  disease.  Please refer to note January 12, 2006, for a problem list.  He  recently saw Dr. Liliane Bade who found his abdominal aortic aneurysm to  be stable.  This was in December 2007.  In October, while he was in  [place] one day, he had loss of vision in both eyes.  He was seen by  Dr. Dellie Burns, an ophthalmologist there, along with me.  Subsequent MRA  of the brain and great vessels was normal.  He has had no recurrences.  We suspect that it was a TIA or it could have been some sort of atypical  migraine.  He has had fasting blood work drawn this morning, it is  pending.   He came for a stress Myoview today.  Baseline blood pressure is 127/70,  his pulse is 58 and sinus brady.  He exercised for a total of 6 minutes  and 30 seconds reaching a net level of 7.7.  His peak heart was 131  beats per minute which is 92% of predicted maximum heart rate.  He  became a little bit short of breath.  Blood pressure response was  adequate.  He had some occasional PVCs.  There was no significant ST  segment changes.  Images are pending.   He is having no angina and no ischemic symptoms.   His meds are unchanged and are listed in the maintenance medication  list.  He is also on Naprosyn 500 b.i.d. for his degenerative joint  disease.   PHYSICAL EXAMINATION:  HEENT:  Normocephalic, atraumatic, PERRLA, extraocular movements intact,  sclerae clear, facial symmetry is normal, dentition is satisfactory.  NECK:  Carotid upstrokes are equal bilaterally without bruits, there is  no JVD, thyroid is not enlarged.  LUNGS:  Clear.  HEART:  Non-displaced PMI, he has normal S1 and S2 without murmurs,  gallops, and rubs.  ABDOMEN:  Soft with good bowel sounds.  There is no tenderness and there  is no hepatomegaly.  EXTREMITIES:  No cyanosis, clubbing, and edema.  Pulses are intact.   Eric [name] seems to be doing well.  I will check on his Myoview images.  I have made no changes in his medical program.  He will follow up with  Dr. Liliane Bade in six months.  He will see me back in six months.     Eric C. Daleen Squibb, MD, [facility]  Electronically Signed    TCW/MedQ  DD: 06/29/2006  DT: 06/29/2006  Job #: [id number]

## 2010-10-23 NOTE — Cardiovascular Report (Signed)
[place]  Patient:    Eric [name], Eric [name] Visit Number: [id number] MRN: [id number]          Service Type: CAT Location: [facility] 2853 [id number] Attending Physician:  Daisey Must Dictated by:   Daisey Must, M.D. [facility] Proc. Date: 05/17/01 Admit Date:  05/17/2001 Discharge Date: 05/17/2001   CC:         Maisie Fus C. Wall, M.D. [facility]  Cardiac Catheterization Lab   Cardiac Catheterization  PROCEDURE PERFORMED:  Left heart catheterization with coronary angiography, bypass graft angiography, and left ventriculography.  INDICATIONS:  Eric [name] is a 77 year old male who has had two previous bypass operations.  He has been asymptomatic but recently underwent a surveillance stress Cardiolite which showed new inferobasal ischemia.  He was therefore referred for cardiac catheterization.  PROCEDURAL NOTE:  A 6-French sheath was placed in the right femoral artery. Native left coronary arteries were injected with a 6-French JL4 catheter.  We used a 6-French JR4 catheter for the native right coronary artery as well as the saphenous vein graft to the posterior descending artery and the old saphenous vein graft to the acute marginal and posterior descending artery. The vein graft to the diagonal was also injected with a JR4.  An LCD catheter was used for the sequential vein graft to the OM-1 and OM-2 as well as a free RIMA to OM-1.  Left internal mammary artery was injected with a left internal mammary artery catheter.  Left ventriculography was performed with an angled pigtail.  Contrast was Omnipaque.  There were no complications.  RESULTS:  Hemodynamics:  Left ventricular pressure 172/14.  Aortic pressure 158/84. There was no aortic valve gradient.  Left ventriculogram:  Wall motion was normal.  Ejection fraction was calculated at 60%.  There was no mitral regurgitation.  Coronary arteriography (left dominant): 1. The left main has a mid to distal 99%  stenosis.  There is little filling of    the circumflex via the native system. 2. The left anterior descending artery is 100% occluded at its origin. 3. The mid circumflex has an 80% stenosis and the distal circumflex is 100%    occluded proximal to the left posterior descending artery. 4. The left internal mammary artery to the distal LAD is patent.  This fills    the mid and distal LAD.  The LAD is also occluded in the mid portion    proximal to the internal mammary insertion. 5. The saphenous vein graft to the first diagonal is patent.  This fills a    large branching first diagonal branch as well as a segment of the proximal    and mid LAD. 6. A sequential saphenous vein graft to a small sub branch of OM-1 and a large    OM-2 is patent.  There is a 95% stenosis in OM-2 proximal to the vein graft    insertion. 7. Free RIMA to the main branch of the first obtuse marginal is patent    throughout its course.  This actually arises from the same hood of the    sequential saphenous vein graft to OM-1 and OM-2. 8. There is an old sequential saphenous vein graft to the acute marginal in    the left posterior descending artery.  This is 100% occluded proximally at    a previous stent site.  There is a new saphenous vein graft to the left    posterior descending artery which is widely patent.  The distal portion of  the saphenous vein graft is actually anastomosed to the sequential limb of    the previously existing vein graft to the acute marginal and posterior    descending artery.  Thus this graft fills both the left posterior    descending artery as well as retrograde via the old vein graft to the acute    marginal.  The distal circumflex is occluded proximal to the posterior    descending artery insertion.  There is a small posterolateral branch which    arises from the distal circumflex prior to the posterior descending artery    which is moderately diffusely diseased in this small  vessel.  IMPRESSIONS: 1. Preserved left ventricular systolic function. 2. Severe native three-vessel coronary artery disease as described. 3. Status post two coronary bypass surgeries.  All grafts are patent with the    exception of the older sequential saphenous vein graft to the acute margin    in the left posterior descending artery; however, this is filled distally    via the new saphenous vein graft to the posterior descending artery.  The    patient otherwise appears to be fully revascularized.  The source of    ischemia may be the small posterolateral branch arising from the distal    circumflex which is not grafted; however, this is a very small vessel.  PLAN:  The patient will be continued on medical therapy.Dictated by:   Daisey Must, M.D. [facility] Attending Physician:  Daisey Must DD:  05/17/01 TD:  05/17/01 Job: [id number]

## 2010-10-23 NOTE — H&P (Signed)
NAME:  Eric [name], Eric [name] NO.:  [id number]   MEDICAL RECORD NO.:  [id number]          PATIENT TYPE:  INP   LOCATION:  0103                         FACILITY:  [facility]   PHYSICIAN:  Iva Boop, M.D. LHCDATE OF BIRTH:  18-May-1926   DATE OF ADMISSION:  12/24/2005  DATE OF DISCHARGE:                                HISTORY & PHYSICAL   CHIEF COMPLAINT:  Mid-abdominal pain.   HISTORY:  Eric [name] is a pleasant 77 year old white male, a primary patient  of Dr. Maisie Fus C. Wall's, also known to Dr. Arlyce Dice, who has a history of  recurrent small bowel obstructions.  He was admitted with an obstruction in  2005, and also in 2000, both times managed conservatively.  He is status  post partial gastrectomy for peptic ulcer disease in 1990.  He is also  status post appendectomy.  He has a history of coronary artery disease and  is status post coronary artery bypass graft surgery in 1995, and a redo in  2001.  He has a history of sleep apnea with CPAP use, hypertension and  dyslipidemia.  He has a known small abdominal aortic aneurysm which is being  by Dr. Madilyn Fireman.  The patient has been doing well recently.  Had the acute  onset last evening at about 1 a.m. with mid-abdominal pain and nausea, which  kept him awake.  He did not have any vomiting.  Was able to have a small  bowel movement, but continue to have pain.  He went back to bed but was  uncomfortable for several hours and then had another small bowel movement,  non-bloody, at about 4 a.m.  Again no vomiting.  This morning he called Dr.  Vern Claude office and was advised to come to the emergency room for evaluation.  He says currently he feels a little bit better.  He has no significant pain.  Does not feel nauseated but does feel a bit bloated.   CURRENT LABORATORY DATA:  WBC of 9.2, hemoglobin 14.4, hematocrit 43.7,  platelets 184.  Pro-time 13.7, INR 1.1.  Potassium 4, BUN 18, creatinine  1.1.  Liver function tests within normal  limits.   A KUB is consistent with a small bowel obstruction.  No free air.   MEDICATIONS:  1.  Plavix 75 mg daily.  2.  Ecotrin 325 mg daily.  3.  Accupril 40 mg daily.  4.  Zocor 10 mg daily.  5.  Norvasc 10 mg daily.  6.  Zetia 10 mg daily.  7.  Naprosyn 500 mg b.i.d.  8.  Protonix 40 mg daily.  9.  Foltx daily.   ALLERGIES:  PENICILLIN.   PAST MEDICAL HISTORY:  As outlined above.   FAMILY HISTORY:  He has one brother with a history of colon cancer.  Two  siblings with coronary disease.   SOCIAL HISTORY:  The patient is married.  He is a retired [employer] fundraiser.  No  ETOH, no tobacco.   REVIEW OF SYSTEMS:  CARDIOVASCULAR:  Negative.  PULMONARY:  Negative.  GENITOURINARY:  Negative.  MUSCULOSKELETAL:  Negative.  He did have  an  arthroscopy on his right knee in May 2007.   PHYSICAL EXAMINATION:  GENERAL:  A well-developed white male, who appears  younger than his stated age.  He is alert and oriented x3.  VITAL SIGNS:  Temperature 97 degrees, blood pressure 115/76, pulse 74,  respirations 20.  HEENT:  Normocephalic and atraumatic.  EOMI.  Pupils equal, round, reactive  to light and accommodation.  Sclerae anicteric.  CARDIOVASCULAR:  A regular rate and rhythm with S1, S2.  LUNGS:  Clear to A&P.  ABDOMEN:  Soft.  He does have a midline incisional scar.  Bowel sounds  active.  No definite rushes.  He is tender in the low mid-abdomen.  No  guarding, no mass or hepatosplenomegaly.  RECTAL:  Negative for occult blood.  EXTREMITIES:  Without clubbing, cyanosis or edema.   IMPRESSION:  58.  A 77 year old white male with recurrent small bowel obstruction, likely      secondary to adhesions, with a previous partial gastrectomy and      appendectomy and a prior history of small bowel obstructions.  2.  Coronary artery disease, status post coronary artery bypass graft      surgery in 1995, and in 2001.  3.  Sleep apnea.  4.  Hypertension.  5.  Dyslipidemia.  6.  Known small  abdominal aortic aneurysm.   PLAN:  The patient is admitted to the service of Dr. Iva Boop for IV  fluid hydration and bowel rest.  Will hold off on an NG tube for the time  being.  Should he have vomiting, will insert an NG tube.  Continue his usual  medications, but hold his Plavix in the event that surgery may be needed.  Followup abdominal films in the a.m.  For details, please see the orders.      Mike Gip, P.A.-C. [facility]      Iva Boop, M.D. [facility]  Electronically Signed    AE/MEDQ  D:  12/24/2005  T:  12/24/2005  Job:  [id number]   cc:   Thomas C. Wall, M.D.  [address]  [address]  [place]  [place] [address]

## 2010-10-23 NOTE — Consult Note (Signed)
[facility]  Patient:    Eric [name], DEPASCALE Visit Number: [id number] MRN: [id number]          Service Type: MED Location: 3W [phone] Attending Physician:  Starr Sinclair Dictated by:   Elisha Ponder, M.D. Proc. Date: 12/07/01 Admit Date:  12/05/2001 Discharge Date: 12/08/2001   CC:         Thomas C. Daleen Squibb, M.D. [facility] T. Hoxworth, M.D.  Venita Lick. Pleas Koch., M.D. [facility]   Consultation Report  DATE OF BIRTH:  02-15-1926.  REFERRING PHYSICIAN:  Jesse Sans. Wall, M.D.  HISTORY OF PRESENT ILLNESS:  I had the pleasure to see Lux Meaders at the [facility] on kind referral from Dr. Daleen Squibb in regard to his left hand predicament.  This patient is a very pleasant 77 year old man who was admitted secondary to a small bowel obstruction.  This patient has continued to improve with conservative measures.  There has been a noted mass about the left hand over the last month, which has been slow-growing with irregular borders.  The patient states it is not particularly pruritic or painful for him.  He denies other lesions.  I have gone over all issues with him at length in regard to his hand.  He states it functions quite well, although he is somewhat concerned about the mass that is present.  PAST MEDICAL HISTORY:  His past medical history is reviewed at length today. The patients past medical history is significant for coronary artery disease. He has had a CABG in 1994.  The patient has also had a remote appendectomy and partial gastrectomy and does occasionally have problems with small bowel obstruction, he notes.  The patient notes no recent problems other than the admission complaints of abdominal pain, nausea, and vomiting.  MEDICATIONS:  The patient currently takes Ecotrin, Plavix, Norvasc, Accupril, Celebrex, Zocor, folic acid, and a multivitamin.  ALLERGIES:  PENICILLIN, which causes a rash; MORPHINE, which causes  confusion; and a question of HEPARIN-INDUCED THROMBOCYTOPENIA.  SOCIAL HISTORY:  He lives with his wife of many years.  He owns a hosiery business.  He formerly smoked and does not use alcohol excessively.  PHYSICAL EXAMINATION:  GENERAL:  He is alert and oriented, in no acute distress.  VITAL SIGNS:  Stable.  He is afebrile.  EXTREMITIES:  He has a mass located about the left hand over the middle finger region.  This is approximately 1.2 x 1.3 cm.  The inner diameter is 8 x 7 mm. The patient has irregular borders to this region.  It appears to be densely adherent to the dermis and epidermal regions.  He has normal FDP, FDS, and extensor function.  There is no sign of dystrophy, infection, or vascular compromise.  He has no epitrochlear or axillary lymph nodes.  Biceps, triceps, flexors, and extensors are intact.  I have not taken any x-rays today.  The opposite extremity is neurovascularly intact without any hand or skin lesions whatsoever.  HEENT:  Ears and nose are free from abnormalities, including skin changes.  IMPRESSION:  Lesion, left hand, x1 month, slow-growing and nonpainful.  Rule out squamous cell carcinoma.  PLAN:  I have discussed with the patient these findings.  Given the appearance and the nature of its growth, etc., I do feel this likely could represent a squamous cell carcinoma and discussed these issues with him.  Differential diagnosis includes keratocanthoma, basal cell carcinoma, squamous cell carcinoma, and other skin afflictions.  I discussed with him that in my opinion, this would be best served with an excision and possible skin grafting versus direct closure as conditions warrant.  I have discussed with him that this is not an emergency and can be done electively.  I have given him my card, and he will call once discharged and we will set this up accordingly. This could certainly be done under a regional block (axillary block with IV sedation).  It  would be nice to have him off the aspirin and Plavix prior to the surgery, and I will, of course, call Dr. Daleen Squibb to confirm this and see if this can be performed.  It was certainly a pleasure to meet this very nice gentleman today, and I look forward to taking care of him.   We had a long discussion, and all questions were encouraged and answered. Dictated by:   Elisha Ponder, M.D. Attending Physician:  Starr Sinclair DD:  12/07/01 TD:  12/12/01 Job: [id number]

## 2010-10-23 NOTE — H&P (Signed)
NAME:  Eric [name], Eric [name] NO.:  [id number]   MEDICAL RECORD NO.:  [id number]          PATIENT TYPE:  INP   LOCATION:  0103                         [facility]ACILITY:  [facility]   PHYSICIAN:  Iva Boop, M.D. LHCDATE OF BIRTH:  [birth year]   DATE OF ADMISSION:  09/16/2004  DATE OF DISCHARGE:                                HISTORY & PHYSICAL   This is a 77 year old white man with a known history of recurrent small-  bowel obstruction who developed the acute onset of diarrhea and some low-  grade fever today with temperature of 100.1.  He and/or his wife called Dr.  Valera [name], his personal physician, and he was advised to stay on clear  liquids.  Later in the day, he had two episodes of nausea and vomiting  without blood.  He is really not having any particular problems right now,  but Dr. Daleen [name] conferred with me, and we had him come to the ER for  evaluation.  He feels comfortable.  He says the diarrhea has subsided if not  stopped as well as the nausea and vomiting.  He has had the fever.  He took  some Biaxin within the last 1-3 months.  He is not really sure exactly when.  He denies any urinary difficulty.  There is no sick contacts or travel  history.  His laboratory studies returned with a new increase in his  creatinine to 2.1, and it had previously been normal in late 2005; i.e.,  December.   MEDICATIONS:  1.  Enteric-coated aspirin 325 mg daily.  2.  Plavix 75 mg daily.  3.  Norvasc 10 mg daily.  4.  Accupril 40 mg daily.  5.  Zetia 10 mg daily.  6.  Zocor 10 mg daily.  7.  Protonix 40 mg daily.  8.  Foltx 1 each day.   DRUG ALLERGIES:  PENICILLIN.   PAST MEDICAL HISTORY:  1.  The patient is status post partial gastrectomy for peptic ulcer disease      in about 1990.  2.  Small-bowel obstruction in 2005 and 2000, resolved conservatively.  3.  Coronary artery bypass grafting in 1995 and 2001.  4.  Sleep apnea with CPAP use.  5.  Status post appendectomy.  6.   Osteoarthritis and gout.  7.  Hypertension.  8.  Dyslipidemia.  9.  Prior history of kidney cysts and nephrolithiasis.   SOCIAL HISTORY:  The patient is married.  His wife and one of his daughters  are here.  He lives in the [place] area.  No alcohol or tobacco use.   REVIEW OF SYSTEMS:  He is not having any genitourinary complains. He denies  any acute musculoskeletal pain or myalgias.  There is no upper respiratory  symptoms.  No chest pain or angina or respiratory difficulty.  He does use  CPAP to sleep.  He has not lost any weight recently.  Prior to this acute  illness, his review of systems is entirely negative other than what things  are mentioned above.  He had really felt well.   FAMILY HISTORY:  Noncontributory at this point.   PHYSICAL EXAMINATION:  GENERAL:  A pleasant elderly white man who looks  mildly ill.  VITAL SIGNS:  Initial blood pressure was 96/61, temperature 100.1,  respirations 20.  Repeat temperature 101.5, blood pressure 96/60,  respirations 18, pulse oximetry 94%.  Initial pulse oximetry was 89%.  HEENT:  Eyes are anicteric.  Mouth shows a tan coat on the tongue, slightly  dry, otherwise clear of lesions.  NECK:  Supple without mass.  CHEST:  Clear.  HEART:  S1 and S2.  I heard no rubs or gallops.  There is a sternotomy scar.  There is an upper abdominal scar.  ABDOMEN:  Soft.  Bowel sounds are present but somewhat diminished.  It is  not tympanitic to percussion.  There is no mass or organomegaly. I detect no  herniae.  EXTREMITIES:  No peripheral edema.  There is no obvious skin rash in the  areas inspected.  NEUROLOGIC:  He appears awake, alert, and oriented x3.  LYMPH NODES:  I detect no neck or supraclavicular nodes.   DATA:  Acute abdominal series shows a fluid-filled colon, but I believe  there are no other acute findings.  Final report is pending, though there  does not appear to be small-bowel obstruction at this point as best I can   tell.   LABORATORY DATA:  His CBC shows a platelet count of 120,000 which is new,  otherwise normal white count at 8.2, hemoglobin at 13.5, and hematocrit 40.  His BMET shows sodium 135, potassium 3.7, chloride 108, CO2 20, BUN 33,  creatinine 2.1, glucose 127.  His creatinine was 1.1 with a BUN of 14 in  December of 2005.  His calcium was 8.1, albumin 3.3, total protein 5.8,  normal LFTs.   ASSESSMENT:  1.  Gastroenteritis, question Clostridium difficile, question viral versus      bacterial or other agents. He did get Biaxin within the past couple of      months, it seems.  That could have triggered Clostridium difficile.  2.  Dehydration, presumably causing acute renal insufficiency.  I suspect he      is probably prerenal.  I do not think he has kidney stones of      obstructive uropathy based upon what the symptom complex is.  3.  Mild thrombocytopenia.  4.  History of small-bowel obstruction.  I do not think that what is going      on now.  5.  Hypertension.  6.  Coronary artery disease.  7.  Sleep apnea.   PLAN:  1.  I think because of these laboratory abnormalities, particularly the BUN      and creatinine, and his comorbidities, he ought to be admitted for      observation and hydration.  I have explained this to him, and he agrees.  2.  We will recheck his laboratory studies in the morning, hold his Accupril      and Norvasc for right now.  3.  Check a urinalysis.  4.  Phenergan as needed.  5.  Check stool Clostridium difficile and culture.  6.  Re-assess in the morning regarding his status and need for further      hospitalization at that time.   I appreciate the opportunity to care for this patient.      CEG/MEDQ  D:  09/16/2004  T:  09/16/2004  Job:  [id number]   cc:   Jesse Sans. Wall, M.D.

## 2010-10-23 NOTE — Assessment & Plan Note (Signed)
OFFICE VISIT   Eric [name], Eric [name]  DOB:  1926-03-18                                       05/25/2007  ZOXWR#:[id number]   Eric [name] returns today for scheduled office visit of small abdominal  aortic aneurysm. He underwent a CT scan today. This reveals maximal  diameter of his aneurysm to be 3.6 cm. The previous area of posterior  ulceration is again noted without change.   The patient remains generally well at this time. No complaints of  abdominal pain or back pain. Remains very active with regular exercise.  No chest pain or shortness of breath.   Chronic medical conditions include coronary artery disease,  hyperlipidemia, hypertension and remote transient ischemic attack.   Eric [name] appears generally well. Alert and oriented. No acute distress.  BP 125/70, pulse 67 per minute, respirations 18 per minute. No carotid  bruits. Chest is clear with equal air entry bilaterally. Normal heart  sounds without murmurs. Regular rate and rhythm. Abdomen: Soft,  nontender. No masses or organomegaly. No abdominal bruits. Normal bowel  sounds. 2+ femoral pulses bilaterally. No ankle edema.   Eric [name] has a stable abdominal aortic aneurysm without evidence of  significant change in size or architecture. We will plan followup again  in 6 months with repeat CT scan. The patient again instructed should he  develop any new onset of pain to please contact the office immediately.   Balinda Quails, M.D.  Electronically Signed   PGH/MEDQ  D:  05/26/2007  T:  05/30/2007  Job:  [id number]   cc:   Thomas C. Wall, MD, [facility]

## 2010-10-23 NOTE — Op Note (Signed)
[place]. [facility]  Patient:    Eric [name], Eric [name]                      MRN: [id number] Proc. Date: 08/03/99 Adm. Date:  [id number] Attending:  Waldo Laine CC:         Gwenith Daily. Tyrone Sage, M.D.             Thomas C. Wall, M.D. [facility]                           Operative Report  PREOPERATIVE DIAGNOSIS:  Recurrent coronary occlusive disease with left main obstruction.  POSTOPERATIVE DIAGNOSIS:  Recurrent coronary occlusive disease with left main obstruction.  SURGICAL PROCEDURE:  Redo coronary artery bypass grafting x 3, with a free right internal mammary graft to the first obtuse marginal coronary artery; reverse saphenous vein graft to the diagonal coronary artery; and reverse saphenous vein graft to the posterior descending coronary artery.  SURGEON:  Gwenith Daily. Tyrone Sage, M.D.  FIRST ASSISTANT:  Salvatore Decent. Dorris Fetch, M.D.  SECOND ASSISTANT:  Lissa Merlin, P.A.  BRIEF HISTORY:  The patient is a 77 year old male who had previously undergone coronary artery bypass graft in 1994.  The patient had done well until recently, when he had recurrent anginal pain in November 2000.  Cardiac catheterization was performed, which demonstrated a proximal stenosis in the vein graft to the right coronary artery, and in addition progressive disease in the native system.  His  mammary graft to the LAD was patent.  A sequential vein graft to the second obtuse marginal and the distal circumflex were patent.  An angioplasty with stent was one on the proximal right graft.  The patient again, in January 2001, had return of  symptoms, which have been progressive.  Repeat cardiac catheterization revealed a patent right vein graft, but in-stent stenosis.  In spite of intensive medical treatment, the patient continued to have pain with exertion and several episodes of rest angina.  Because of these progressive symptoms, redo coronary artery bypass grafting was  recommended to the patient; who agreed and signed an informed consent.  DESCRIPTION OF PROCEDURE:  With Swan-Ganz and arterial line monitors in place, he patient underwent general endotracheal anesthesia without incidence.  The skin f the chest and legs was prepped with Betadine and draped in the usual sterile manner.  A segment of vein was harvested from the left lower extremity.  A median sternotomy was performed with sagittal saw, taking care not to injure any underlying structures.  The right chest was elevated slightly, and the right internal mammary artery was taken down initially as a pedicle graft; but ultimately this had good free flow.  The mammary was completely divided and used as a free  graft to the obtuse marginal coronary artery.  After tedious dissection of the ascending aorta in the right atrium, taking care to preserve the patent vein graft to the second obtuse marginal distal circumflex, and also the patent left internal mammary graft to the LAD.  The patient was then systemically heparinized.  The ascending aorta and the right atrium were cannulated.  An aortic root pick cardioplegia needle was introduced into the ascending aorta.  The patient was placed on cardiopulmonary bypass, 2.4 min/msq.  Sites of anastomosis were selected and dissected out of the epicardium.  While on pump, extensive amount of dissection of the left ventricle was undertaken.  However, to adequately get visualization  of the left side of the heart, the patients body temperature was cooled to 30 degrees.  Aortic crossclamp was applied.  Antegrade cardioplegia was administered initially to obtain cardiac arrest.  Additional cold blood cardioplegia was administered retrograde.  A bulldog clamp was placed on the left mammary artery.  The remainder of the dissection was then carried out with some  difficulty.  The first obtuse marginal coronary artery and the diagonal coronary arteries were  dissected out and identified.  In addition, the vein graft to the  posterior descending was identified, just into the hood of the vein graft into he posterior descending the vein graft was opened.  A segment of reverse saphenous  vein graft was then anastomosed to the posterior descending coronary artery at he hood of the distal previous anastomosis.  Attention was then turned to the diagonal coronary artery, which was long but small vessel.  There was several small branches in this vicinity, and the largest of he branches was selected and opened.  A 1 mm probe passed distally.  Using running  7-0 Prolene, distal anastomosis was performed.  Attention was then turned to the obtuse marginal coronary artery, which was opened and admitted a 1.5 mm probe.  Using a running 8-0 Prolene, the free right internal mammary graft was anastomosed to the first obtuse marginal.  The patients body temperature was then rewarmed.  Aortic crossclamp was removed, with total crossclamp time of 87 min.  Partial occlusion clamp was placed on the ascending aorta.  Two right punch aortotomies were created in the ascending aorta. Each of the two vein grafts were anastomosed to the ascending aorta.  The hood f the vein graft to the second obtuse marginal and distal circumflex was then opened slightly, and the free right mammary graft was anastomosed to the hood of the old graft (which was still patent).  Air was evacuated from the grafts, and the partial occlusion clamp was removed.  Sites of anastomoses were inspected and free of bleeding.  The patient was then ventilated and weaned from cardiopulmonary bypass without difficulty (on low-dose dopamine).  He remained hemodynamically stable.  He was decannulated in the usual fashion.  Protamine sulfate was administered.  With operative field hemostatic, a right pleural and two mediastinal tubes were  left in place.  There was not sufficient  pericardium to completely recover the right ventricle; mediastinal fat was brought over the ascending aorta.  Two graft markers were placed on each of the two new vein grafts.  The previous vein graft to  the second obtuse marginal and distal circumflex marks the takeoff of the free right internal mammary graft.  The sternum was then closed with #6 stainless steel wire.  Fascia was closed with interrupted 0 Vicryl, running 3-0 Vicryl and subcutaneous tissue with 4-0 Vicryl.  Subcuticular stitch in the skin edges. Dry dressings were applied.  Sponge and needle count was reported as correct at the  completion of the procedure.  The patient tolerated the procedure without obvious complication.  Was transferred to the surgical intensive care unit for further postoperative care.  Aprotinin was administered during the operative procedure.  The patient did not require any blood bank blood products during the operative procedure. DD:  08/05/99 TD:  08/05/99 Job: [id number]

## 2010-10-23 NOTE — Discharge Summary (Signed)
NAME:  JAVAUN, DIMPERIO NO.:  [id number]   MEDICAL RECORD NO.:  [id number]          PATIENT TYPE:  INP   LOCATION:  1514                         FACILITY:  [facility]   PHYSICIAN:  Iva Boop, M.D. LHCDATE OF BIRTH:  1926/05/28   DATE OF ADMISSION:  12/24/2005  DATE OF DISCHARGE:  12/25/2005                                 DISCHARGE SUMMARY   ADMITTING DIAGNOSES:  66. 77 year old white male with recurrent partial small bowel obstruction      likely secondary to adhesions with history of previous partial      gastrectomy, appendectomy and previous small-bowel obstruction.  2. Coronary artery disease status post CABG 95 and redo in 2001.  3. Sleep apnea.  4. Hypertension.  5. Dyslipidemia.  6. Known small abdominal aortic aneurysm.   DISCHARGE DIAGNOSES:  1. Resolving low grade partial small-bowel obstruction.  2. Coronary artery disease status post CABG 95 and redo in 2001.  3. Sleep apnea.  4. Hypertension.  5. Dyslipidemia.  6. Known small abdominal aortic aneurysm.   CONSULTATIONS:  None.   PROCEDURES:  None.   BRIEF HISTORY:  Mr. Monestime is a pleasant 77 year old white male primary  patient of Dr. Juanito Doom, also known to Dr. Arlyce Dice with history as described  above.  He was admitted with a partial small-bowel obstruction in 2005 and  also in 2000.  Both times he was managed conservatively.  He has a known  small abdominal aortic aneurysm which is being followed by Dr. Madilyn Fireman.  The  patient has been doing well recently but had acute onset evening before  admission about 1:00 a.m. of midabdominal pain and nausea which kept him  awake most of the night. He did not have any vomiting and was able to have a  small bowel movement was continued to have pain.  Several hours later he was  able to have a bowel movement and did not have any vomiting all night, but  due to persistent pain called the office and was advised to come for further  evaluation to the  emergency room.   The patient was seen, evaluated in the emergency room.  KUB is consistent  with partial small-bowel obstruction and he is admitted for supportive  management.   Laboratory studies on 07/20 showed a WBC of 9.2, hemoglobin 14.4, hematocrit  of 43.7, MCV of 94.  Pro time 13.7, INR 1.  Electrolytes within normal  limits.  BUN 18, creatinine 1.  Liver function studies within normal limits.  Albumin 4.2.  Plain abdominal films on 07/20 showed a small bowel  obstruction.  Follow-up on 07/21 showed slight interval improvement in the  apparent small-bowel obstruction.   HOSPITAL COURSE:  The patient was admitted to the service of Dr. Stan Head and then followed by Dr. Russella Dar on call.  He was kept n.p.o. started  on IV fluids, did not require NG placement as he has not had any vomiting.  His symptoms quickly improved and by the following morning he was feeling  well and has had several bowel movements, was passing gas and feeling  much  better.  His KUB did show some improvement but was not normal.  He was  anxious for discharge and was allowed discharge to home with instructions to  stay on a clear liquid diet for the next 24 hours and then gradually advance  over the next 48 hours.  He was to call for any problems with recurrent  pain, distension and nausea or vomiting.  He was to follow up with Dr.  Arlyce Dice in the office as needed.   MEDICATIONS:  Same as on admission including Plavix 75 daily, Ecotrin 325  q.d., Accupril 40 q.d., Zocor 10 q.d., Norvasc 10 q.d., Zetia 10 q.d.,  Naprosyn 500 b.i.d., Protonix 400 daily and Foltx daily.   CONDITION ON DISCHARGE:  Improved thank.      Mike Gip, P.A.-C. [facility]      Iva Boop, M.D. [facility]  Electronically Signed    AE/MEDQ  D:  01/06/2006  T:  01/06/2006  Job:  [id number]   cc:   Thomas C. Wall, M.D.  [address]  [address]  [place]  [place] [address]

## 2010-10-23 NOTE — Consult Note (Signed)
[place]. [facility]  Patient:    Eric [name], Eric [name]                      MRN: [id number] Proc. Date: 08/05/99 Adm. Date:  [id number] Attending:  Waldo Laine CC:         Gwenith Daily. Tyrone Sage, M.D.                          Consultation Report  REFERRING PHYSICIAN:  Gwenith Daily. Tyrone Sage, M.D.  REASON FOR CONSULTATION: 1. Thrombocytopenia. 2. Status post CABG.  HISTORY OF PRESENT ILLNESS:  [name] is a very nice 77 year old white gentleman with a past history significant for coronary artery disease, hypertension and hyperlipidemia.  He has undergone coronary artery bypass graft.  He had no difficulties with this.  He began to experience recurrent chest discomfort.  This was back in November. He underwent cardiac catheterization which shows coronary artery disease.  He was n maximum medical management.  He was subsequently felt to be a candidate for surgical intervention.  LABORATORY DATA:  He had lab studies done preoperative on July 30, 1999. This showed a white cell count of 5.5, hemoglobin 12.9, hematocrit 38.7, platelet count 168.  MCV was 90.  His electrolytes all were within normal limits.  Prothrombin  time was 13.8 seconds.  He underwent successful bypass surgery on the August 03, 1999.  He underwent  vessel bypass.  He subsequently was placed on the routine post CABG medications. This included heparin.  I think he was also placed on aprotinin.  His platelet count subsequently declined.  On August 03, 1999, postoperative he platelet count was 73,000.  This subsequently bumped up to 96,000.  He has subsequently declined.  On August 04, 1999, platelet count was 68,000.  Today, his platelet count was 54,000.  White cell count is 10.6, hemoglobin 9.1, hematocrit 27.2.  He subsequently was taken off his heparin.  I think this was one a couple of days ago.  He is off all drips.  He was on a nitroglycerin drip  and a Nipride drip.  CURRENT MEDICATIONS: 1. Zocor. 2. Altace. 3. Norvasc. 4. Enteric-coated aspirin. 5. Zinacef. 6. Lopressor. 7. Pepcid.  There have been no problems with bleeding.  He has his chest tubes in.  There has been no obvious bloody discharge.  Dr. Wynetta Fines subsequently [place] for evaluation.  PAST MEDICAL HISTORY: 1. Hyperlipidemia. 2. Coronary artery disease. 3. Hypertension. 4. A history of subtotal gastrectomy secondary to upper gastrointestinal bleed. 5. History of osteoarthritis.  ALLERGIES:  PENICILLIN.  ADMISSION MEDICATIONS: 1. Accupril 2. Norvasc 3. Aspirin 4. Zocor. 5. Celebrex. 6. He had been on Plavix but this had been discontinued.  SOCIAL HISTORY:  He denies a history of smoking or alcohol use.  FAMILY HISTORY:  Relatively noncontributory.  REVIEW OF SYSTEMS:  As in the history of present illness.  H  PHYSICAL EXAMINATION:  GENERAL:  This is a well-developed well-nourished white gentleman in no obvious  distress.  VITAL SIGNS:  Show a temperature of 98.6, pulse 78, respiratory rate 18, blood pressure 160/70.  HEENT:  Normocephalic, atraumatic skull.  There are no ocular or oral lesions.  There is no palpable cervical, supraclavicular lymph nodes.  He has a central line in his right internal jugular.  LUNGS:  Decreased breath sounds at the bases.  CARDIAC EXAM:  Regular rate and rhythm with a normal S1, S2.  ABDOMEN:  Shows a soft abdomen with good bowel sounds.  He has chest tubes coming out the upper abdominal wall.  There is no hepatosplenomegaly.  EXTREMITIES:  Shows healing vein graft incision on the inside of his left lower  leg.  No petechiae noted on his lower extremities.  No petechiae, no ecchymosis  appreciated.  NEUROLOGIC:  Shows no focal neurological deficits.  LABORATORY DATA:  Peripheral blood smear review is pending.  IMPRESSION:  [name] is a 76 year old gentleman with coronary artery  disease. He is status post coronary artery bypass graft.  He now has thrombocytopenia.  I really do need to look at the blood smear to have a better sense of the etiology of the thrombocytopenia.  One would have to suspect, however, that this is likely iatrogenic with respect to his medications.  This could certainly represent HIT  syndrome.  His heparin has subsequently been discontinued.  If this is HIT syndrome, then one would suspect that the platelet count should start to recover in the next day or so.  It has only been a day since all the heparin was discontinued. We could certainly send off studies for heparin induced thrombocytopenia although the test is not sensitive.  A negative test does not rule out heparin induced thrombocytopenia.  Regardless, I would be very careful in using the heparin down the road.  If he does need to be anticoagulated, I would probably use Refludan.  There is also the new antithrombotic agent Argatroban.  I do not believe this represents a permanent hematological condition.  I do not  think this is mild dysplasia.  I do not think this ITP.  I do not suspect this represents DIC.  Sending off a DIC screen I think would not be worthwhile as he  likely will be positive just on the basis of his surgery.  I have noted that he had a central gastrectomy.  One could worry about abundant B12 deficiency down the road, although, I do not believe this is a problem right now.  For now I would just observe [name].  I do not think he needs a platelet transfusion.  I would not give him aspirin right now but would hold until his platelet count does recover.  If he does begin to bleed, I would transfuse him with platelets.  One would suspect that he has platelet dysfunction in addition to thrombocytopenia because of being on the aspirin.  I would also be careful with any nonsteroidals that he may be getting.  Again, hey may block his platelet  function.  We will follow [name] along closely.  I will make sure that the lab does a blood  screen for me for review.  I have appreciated the opportunity to see [name]. DD:  08/05/99 TD:  08/05/99 Job: [id number]

## 2010-10-23 NOTE — Discharge Summary (Signed)
[place]. [facility]  Patient:    Eric [name], Eric [name]                      MRN: [id number] Adm. Date:  [id number] Disc. Date: [id number] Attending:  Waldo Laine Dictator:   Lissa Merlin, P.A. CC:         Gwenith Daily Tyrone Sage, M.D.             Thomas C. Wall, M.D. [facility]             Peter R. Myna Hidalgo, M.D.                           Discharge Summary  DATE OF BIRTH:  December 18, 1925  SURGEON:  Dr. Ofilia Neas.  CARDIOLOGIST:  Dr. Juanito Doom.  HEMATOLOGIST:  Dr. Arlan Organ.  ADMISSION DIAGNOSES: 1. Unstable angina. 2. Dyspnea on exertion. 3. Progressive coronary artery disease per recent cardiac catheterization and    percutaneous transluminal coronary angioplasties in November 2000, and    January 2001.  PAST MEDICAL HISTORY: 1. Coronary artery disease with CABG in 1994, and recent catheterization and    PTCA November 2000, and June 19, 1999. 2. Hyperlipidemia. 3. Hypertension. 4. Major upper GI bleed with subtotal gastrectomy. 5. History of appendectomy. 6. Small-bowel obstruction in the fall of 2000, which resolved spontaneously. 7. Osteoarthritis of the knees.  DISCHARGE DIAGNOSES: 1. Status post redo coronary artery bypass grafting x 3. 2. Anemia and thrombocytopenia postoperatively, probably secondary to heparin,    currently stable, treated with supplements by mouth. 3. Postoperative confusion, resolved after discontinuation of morphine    sulfate.  PROCEDURES:  CABG x 3 on August 03, 1999, with the following grafts:  Free right internal mammary artery to OM 1, saphenous vein graft to diagonal, saphenous vein graft to the PDA.  HISTORY OF PRESENT ILLNESS AND HOSPITAL COURSE:  Eric [name] has a significant history of CAD with CABG in 1994, as indicated above.  He had been doing well without symptoms until November 2000, when he began noticing increasing shortness of breath and chest pain with exertion.  On April 24, 1999, a cardiac  catheterization was performed which showed the left main with a 70-80% lesion, LAD with a 90% ostial lesion, and a totally occluded circumflex with an 80% distal lesion with total occlusion of the first and third obtuse marginals.  The right was nondominant.  The vein graft of the acute marginal into the posterior descending coronary artery had a proximal 99% lesion.  The vein graft to the second and third obtuse marginals was patent.  The left internal mammary to the LAD was also patent.  PTCA was done on the vein graft to the posterior descending and acute marginal with initial good result. After two weeks, he had recurrent exertional chest pain and was seen also because of complaints of difficulty holding a razor in his right hand.  There were no other focal neurological complaints.  This resolved gradually over several weeks.  He was worked up by Dr. Katrinka Blazing.  Stroke was ruled out.  Carotid Doppler showed no evidence of obstruction.  He was continued on Plavix and aspirin.  He also had a transesophageal echocardiogram to rule out evidence of clot.  He had no previous history of atrial fibrillation.  TEE indicated a mildly dilated aortic root and mild aortic valve sclerosis without stenosis. Because of his continuing symptoms, in spite  of medical management, repeat cardiac catheterization was done on June 19, 1999.  This showed a renarrowing of his right vein graft that was stenotic.  In addition, it appeared that the first diagonal was also in jeopardy because of progression of the left main disease.  At this time it was thought that redo CABG would probably be his best treatment alternative.  He was referred to Dr. Tyrone Sage for evaluation.  After reviewing the films with Dr. Daleen Squibb and Dr. Juanda Chance and examining Eric [name] and all the available data, redo CABG was, indeed, recommended.  Risks, benefits, details, and alternatives of surgery were discussed with Eric [name], and it was agreed to  proceed.  He was brought into the hospital and scheduled on August 03, 1999, for the elective procedure. He underwent the procedure without obvious complications and was transferred to SICU in stable condition.  On postoperative day #1, he was hemodynamically stable.  He was awake and neurologically intact.  His hemoglobin was 29%. Platelets were noticed to be 85,000.  Heparin was discontinued.  He was in sinus rhythm.  BUN and creatinine were 13/1.0.  INR was noticed to be 2.0.  He was also noted to be confused.  This resolved gradually after discontinuation of his morphine sulfate.  Hematology consult was called for evaluation of his thrombocytopenia.  After evaluation by Dr. Myna Hidalgo, it was believed that his anemia and thrombocytopenia were possibly due to heparin.  There was no treatment warranted at the time, and it was felt that it was safe just to observe his CBC closely.  Meanwhile, Eric [name] was also followed by cardiology.  His platelets gradually started to improve after discontinuation of heparin.  He was being diuresed.  He was noted to be making steady progress.  He was deemed suitable to transfer to TCU, and was transferred on August 06, 1999.  On August 07, 1999, postoperative day #4, he was afebrile, vital signs were stable, O2 saturation was satisfactory.  Urine output was good. Platelets were 72,000, hematocrit was 24.5%.  He was deemed suitable to transfer to Unit 2000 and was done so.  He was started with cardiac rehabilitation phase 1.  On August 08, 1999, Eric [name] was noted to be lucent and neurologically intact.  His vital signs were stable, he was afebrile.  He said he felt well, but was tired from his inability to sleep and desperately wanted to go home.  His lungs were clear.  His abdomen was soft, with positive bowel sounds.  He was in sinus rhythm.  His wounds were noted to be healing well.  He had trace lower extremity edema.  He was just slightly over  his preoperative weight.  Hemoglobin was 8.4.  Platelets had increased to 130,000.  BUN and creatinine were 23/1.0.  After evaluation by Dr. Daleen Squibb and Dr. Tyrone Sage, it was agreed to let Mr. Meek go home.  He was clinically stable for discharge and was subsequently discharged.  MEDICATIONS: 1. Accupril 40 mg 1 p.o. q.a.m. 2. Enteric-coated aspirin 325 mg 1 p.o. q.a.m. 3. Celebrex 200 mg 1 p.o. q.a.m. 4. Chromagen Forte 1 twice a day. 5. Zocor 10 mg 1 p.o. q.a.m. 6. Vitamin E 800 IU 1 p.o. q.a.m. 7. Norvasc 2.5 mg 1 p.o. q.a.m. 8. Darvocet-N 100 1-2 p.o. q.6h. p.r.n. for pain.  ALLERGIES: 1. As pointed out during this hospitalization, Mr. Sells may have had    heparin-induced thrombocytopenia and should possibly avoid HEPARIN in the    future. 2.  Questionable MORPHINE allergy causing confusion. 3. History of rash with PENICILLIN.  SPECIAL INSTRUCTIONS:  Mr. Ofallon was told to avoid strenuous activity.  No heavy lifting over 10 pounds.  Told to walk daily.  He was told to keep his incisions clean and dry, use soap and water only.  Call the office if he noticed any changes with his wounds.  Activity instructions were reviewed with the patient and his family.  FOLLOW-UP:  Follow-up at the time was arranged by Dr. Daleen Squibb.  This included three-week follow-up with Dr. Tyrone Sage, and a two-week appointment with Dr. Daleen Squibb.  CONDITION ON DISCHARGE:  Mr. Meyn was clinically stable at the time of discharge. DD:  10/27/99 TD:  10/30/99 Job: [id number]

## 2010-10-27 ENCOUNTER — Encounter: Payer: Self-pay | Admitting: Cardiology

## 2010-10-29 ENCOUNTER — Other Ambulatory Visit: Payer: Self-pay | Admitting: Cardiology

## 2010-10-30 ENCOUNTER — Encounter: Payer: Self-pay | Admitting: Cardiology

## 2010-10-30 ENCOUNTER — Encounter: Payer: Medicare Other | Admitting: Cardiology

## 2010-10-30 ENCOUNTER — Encounter (INDEPENDENT_AMBULATORY_CARE_PROVIDER_SITE_OTHER): Payer: Medicare Other | Admitting: Cardiology

## 2010-10-30 ENCOUNTER — Ambulatory Visit (INDEPENDENT_AMBULATORY_CARE_PROVIDER_SITE_OTHER): Payer: Medicare Other | Admitting: Cardiology

## 2010-10-30 VITALS — BP 138/78 | HR 74 | Resp 18 | Ht 70.0 in | Wt 170.0 lb

## 2010-10-30 DIAGNOSIS — I2510 Atherosclerotic heart disease of native coronary artery without angina pectoris: Secondary | ICD-10-CM

## 2010-10-30 DIAGNOSIS — I679 Cerebrovascular disease, unspecified: Secondary | ICD-10-CM

## 2010-10-30 DIAGNOSIS — I10 Essential (primary) hypertension: Secondary | ICD-10-CM

## 2010-10-30 DIAGNOSIS — R0989 Other specified symptoms and signs involving the circulatory and respiratory systems: Secondary | ICD-10-CM

## 2010-10-30 DIAGNOSIS — I441 Atrioventricular block, second degree: Secondary | ICD-10-CM

## 2010-10-30 DIAGNOSIS — I714 Abdominal aortic aneurysm, without rupture, unspecified: Secondary | ICD-10-CM

## 2010-10-30 DIAGNOSIS — I25810 Atherosclerosis of coronary artery bypass graft(s) without angina pectoris: Secondary | ICD-10-CM

## 2010-10-30 DIAGNOSIS — I4891 Unspecified atrial fibrillation: Secondary | ICD-10-CM

## 2010-10-30 DIAGNOSIS — I6529 Occlusion and stenosis of unspecified carotid artery: Secondary | ICD-10-CM

## 2010-10-30 DIAGNOSIS — I700 Atherosclerosis of aorta: Secondary | ICD-10-CM

## 2010-10-30 MED ORDER — TRAMADOL HCL 50 MG PO TABS: 50.0000 mg | ORAL_TABLET | Freq: Four times a day (QID) | ORAL | Status: AC | PRN

## 2010-10-30 NOTE — Assessment & Plan Note (Signed)
Stable. Probably causing some fatigue. No change in treatment.

## 2010-10-30 NOTE — Patient Instructions (Signed)
Your physician recommends that you schedule a follow-up appointment in: 6 months with Dr. Wall  

## 2010-10-30 NOTE — Assessment & Plan Note (Addendum)
Preliminary [place] today shows stability at 3.6 x 3.5. Will obtain final report and let him know. I do not think his back pain is vascular. Encouraged visit to [facility].

## 2010-10-30 NOTE — Progress Notes (Signed)
   Patient ID: DALAN COWGER, male    DOB: 03/12/1926, 77 y.o.   MRN: [id number]  HPI  Mr Cech returns today for 6 month E and M of his multiple medical problems. He is really struggling with chronic back pain. He has an appointment with Orthopedics at [facility] next week. He still does Heart Track 5 days a week.  He denies any angina or check pain. Pacer checked recently and function is normal. He is pacer dependent.  No symptoms of TIA's or PVD.    Review of Systems  Unable to perform ROS All other systems reviewed and are negative.      Physical Exam  Nursing note and vitals reviewed. Constitutional: He is oriented to person, place, and time. He appears well-developed and well-nourished.  HENT:  Head: Normocephalic and atraumatic.  Eyes: EOM are normal. Pupils are equal, round, and reactive to light.  Neck: Neck supple. No JVD present. No tracheal deviation present.  Cardiovascular: Normal rate, regular rhythm, S1 normal and intact distal pulses.   No extrasystoles are present. PMI is not displaced.        Paradoxical S2  Pulmonary/Chest: Effort normal and breath sounds normal.  Abdominal: Soft. Bowel sounds are normal.  Musculoskeletal: He exhibits no edema.       Decreased ROM.  Lymphadenopathy:    He has no cervical adenopathy.  Neurological: He is alert and oriented to person, place, and time.  Psychiatric: He has a normal mood and affect.

## 2010-10-30 NOTE — Assessment & Plan Note (Signed)
Stable, no need for stress study.

## 2010-10-30 NOTE — Assessment & Plan Note (Signed)
PPM functioning normal. Pacer dependent.

## 2010-11-02 ENCOUNTER — Ambulatory Visit: Payer: Medicare Other | Admitting: Family Medicine

## 2010-11-02 ENCOUNTER — Emergency Department: Payer: Medicare Other | Admitting: Internal Medicine

## 2010-11-04 ENCOUNTER — Encounter: Payer: Self-pay | Admitting: Cardiology

## 2010-11-10 ENCOUNTER — Telehealth: Payer: Self-pay | Admitting: *Deleted

## 2010-11-10 DIAGNOSIS — F419 Anxiety disorder, unspecified: Secondary | ICD-10-CM

## 2010-11-10 MED ORDER — ZOLPIDEM TARTRATE ER 12.5 MG PO TBCR: EXTENDED_RELEASE_TABLET | ORAL | Status: DC

## 2010-11-10 NOTE — Telephone Encounter (Signed)
A prescription was called for Ambien 10 mg( not 12.5 mg CR as it is available in E-scribed)  to  [facility] in [place]. Pt to take one tablet by mouth at bedtime as needed. Patient and wife aware.

## 2010-12-11 ENCOUNTER — Other Ambulatory Visit: Payer: Self-pay | Admitting: Cardiology

## 2011-01-18 ENCOUNTER — Encounter: Payer: Self-pay | Admitting: Internal Medicine

## 2011-01-18 ENCOUNTER — Ambulatory Visit (INDEPENDENT_AMBULATORY_CARE_PROVIDER_SITE_OTHER): Payer: Medicare Other | Admitting: Internal Medicine

## 2011-01-18 ENCOUNTER — Encounter: Payer: Medicare Other | Admitting: Internal Medicine

## 2011-01-18 DIAGNOSIS — I495 Sick sinus syndrome: Secondary | ICD-10-CM

## 2011-01-18 DIAGNOSIS — Z950 Presence of cardiac pacemaker: Secondary | ICD-10-CM

## 2011-01-18 DIAGNOSIS — I441 Atrioventricular block, second degree: Secondary | ICD-10-CM

## 2011-01-18 DIAGNOSIS — I4891 Unspecified atrial fibrillation: Secondary | ICD-10-CM

## 2011-01-18 NOTE — Assessment & Plan Note (Signed)
Normal pacemaker function See Arita Miss Art report No changes today  carelink every 3 months Return to see me in 12 months

## 2011-01-18 NOTE — Patient Instructions (Signed)
Your physician wants you to follow-up in: 12 months with Dr Allred You will receive a reminder letter in the mail two months in advance. If you don't receive a letter, please call our office to schedule the follow-up appointment.   Remote monitoring is used to monitor your Pacemaker of ICD from home. This monitoring reduces the number of office visits required to check your device to one time per year. It allows us to keep an eye on the functioning of your device to ensure it is working properly. You are scheduled for a device check from home on 04/22/2011 You may send your transmission at any time that day. If you have a wireless device, the transmission will be sent automatically. After your physician reviews your transmission, you will receive a postcard with your next transmission date.   

## 2011-01-18 NOTE — Assessment & Plan Note (Signed)
Permanent afib/ atrial flutter  We will check crcl and CBC today on pradaxa

## 2011-01-18 NOTE — Progress Notes (Signed)
The patient presents today for routine electrophysiology followup.  Since last being seen in our clinic, the patient reports doing very well.  His primary concern is with chronic back pain.  Today, he denies symptoms of palpitations, chest pain, shortness of breath, orthopnea, PND, lower extremity edema, dizziness, presyncope, syncope, or neurologic sequela.  The patient feels that he is tolerating medications without difficulties and is otherwise without complaint today.   Past Medical History  Diagnosis Date  . BPH (benign prostatic hypertrophy)   . DJD (degenerative joint disease)   . Hx of transient ischemic attack (TIA)   . AAA (abdominal aortic aneurysm)   . Cerebrovascular disease   . HLD (hyperlipidemia)   . HTN (hypertension)   . Second degree AV block, Mobitz type II     s/p PPM  . CAD (coronary artery disease)   . Persistent atrial fibrillation     on pradaxa   Past Surgical History  Procedure Date  . Pacemaker insertion 01/24/10    Adapta Medtronic   . Coronary artery bypass graft   . Appendectomy   . Gastrectomy     Current Outpatient Prescriptions  Medication Sig Dispense Refill  . amLODipine (NORVASC) 5 MG tablet TAKE 1 TABLET EVERY DAY  30 tablet  6  . aspirin 81 MG tablet Take 81 mg by mouth daily.        . dabigatran (PRADAXA) 150 MG CAPS Take 150 mg by mouth every 12 (twelve) hours.        . finasteride (PROSCAR) 5 MG tablet Take 1 tablet (5 mg total) by mouth daily.  30 tablet  11  . folic acid-pyridoxine-cyancobalamin (FOLTX) 2.5-25-2 MG TABS Take 1 tablet by mouth daily.        . Multiple Vitamin (MULTIVITAMINS PO) Take 1 tablet by mouth daily.        . naproxen (NAPROSYN) 500 MG tablet Take 500 mg by mouth 2 (two) times daily with a meal.        . pantoprazole (PROTONIX) 40 MG tablet Take 40 mg by mouth daily.        . quinapril (ACCUPRIL) 20 MG tablet TAKE 1 TABLET EVERY DAY  30 tablet  6  . simvastatin (ZOCOR) 20 MG tablet Take 1 tablet (20 mg total) by  mouth at bedtime.  30 tablet  6  . traMADol (ULTRAM) 50 MG tablet Take 1 tablet (50 mg total) by mouth every 6 (six) hours as needed for pain.  30 tablet  3  . zolpidem (AMBIEN CR) 12.5 MG CR tablet A prescription was called to pharmacy for Ambien 10 mg. Take one tablet by mouth at bedtime as needed.   30 tablet  2    Allergies  Allergen Reactions  . Morphine   . Penicillins     History   Social History  . Marital Status: Married    Spouse Name: N/A    Number of Children: N/A  . Years of Education: N/A   Occupational History  . retired    Social History Main Topics  . Smoking status: Former Smoker    Quit date: 06/07/1968  . Smokeless tobacco: Not on file  . Alcohol Use: No  . Drug Use: Not on file  . Sexually Active: Not on file   Other Topics Concern  . Not on file   Social History Narrative  . No narrative on file    Family History  Problem Relation Age of Onset  . Coronary artery disease    .  Colon cancer     Physical Exam: Filed Vitals:   01/18/11 0922  BP: 134/83  Pulse: 82  Resp: 14  Height: 5\' 10"  (1.778 m)  Weight: 174 lb (78.926 kg)    GEN- The patient is well appearing, alert and oriented x 3 today.   Head- normocephalic, atraumatic Eyes-  Sclera clear, conjunctiva pink Ears- hearing intact Oropharynx- clear Neck- supple, no JVP Lymph- no cervical lymphadenopathy Lungs- Clear to ausculation bilaterally, normal work of breathing Chest- pacemaker pocket is well healed Heart- Regular rate and rhythm, no murmurs, rubs or gallops, PMI not laterally displaced GI- soft, NT, ND, + BS Extremities- no clubbing, cyanosis, or edema MS- no significant deformity or atrophy Skin- no rash or lesion Psych- euthymic mood, full affect Neuro- strength and sensation are intact  Pacemaker interrogation- reviewed in detail today,  See PACEART report  Assessment and Plan:

## 2011-02-06 DIAGNOSIS — K253 Acute gastric ulcer without hemorrhage or perforation: Secondary | ICD-10-CM

## 2011-02-06 HISTORY — DX: Acute gastric ulcer without hemorrhage or perforation: K25.3

## 2011-02-16 ENCOUNTER — Ambulatory Visit (INDEPENDENT_AMBULATORY_CARE_PROVIDER_SITE_OTHER): Payer: Medicare Other | Admitting: Cardiology

## 2011-02-16 ENCOUNTER — Encounter: Payer: Self-pay | Admitting: Cardiology

## 2011-02-16 VITALS — BP 134/70 | HR 65 | Ht 70.0 in | Wt 169.0 lb

## 2011-02-16 DIAGNOSIS — R109 Unspecified abdominal pain: Secondary | ICD-10-CM

## 2011-02-16 NOTE — Patient Instructions (Signed)
Clear liquids for the next 24 hours.

## 2011-02-16 NOTE — Progress Notes (Signed)
HPI Mr Eric [name] comes in today with abdominal pain. He woke up during the night and felt like something was kicking him in the stomach. He's had a history of 2 small bowel obstructions from previous abdominal surgery and adhesions. He's had one episode of nausea and vomiting after eating some cheese crackers.He is passing his bowel movements fine this had several stools which is more frequent than usual. He denies any bowel or medication. All this had no fever, headache, arthralgias.   He denies any bowel distention which has always been a finding in the past with his bowel obstruction.  He  Feels somewhat better this afternoon.  He denies any back pain or radiation of pain into his legs or into his groin. He has a history of a small abdominal aortic aneurysm.  He denies any genitourinary symptoms. He  Recently had painless hematuria with cystoscopy being negative. Past Medical History  Diagnosis Date  . BPH (benign prostatic hypertrophy)   . DJD (degenerative joint disease)   . Hx of transient ischemic attack (TIA)   . AAA (abdominal aortic aneurysm)   . Cerebrovascular disease   . HLD (hyperlipidemia)   . HTN (hypertension)   . Second degree AV block, Mobitz type II     s/p PPM  . CAD (coronary artery disease)   . Persistent atrial fibrillation     on pradaxa    Past Surgical History  Procedure Date  . Pacemaker insertion 01/24/10    Adapta Medtronic   . Coronary artery bypass graft   . Appendectomy   . Gastrectomy     Family History  Problem Relation Age of Onset  . Coronary artery disease    . Colon cancer      History   Social History  . Marital Status: Married    Spouse Name: N/A    Number of Children: N/A  . Years of Education: N/A   Occupational History  . retired    Social History Main Topics  . Smoking status: Former Smoker    Quit date: 06/07/1968  . Smokeless tobacco: Not on file  . Alcohol Use: No  . Drug Use: Not on file  . Sexually Active: Not on  file   Other Topics Concern  . Not on file   Social History Narrative  . No narrative on file    Allergies  Allergen Reactions  . Morphine   . Penicillins     Current Outpatient Prescriptions  Medication Sig Dispense Refill  . amLODipine (NORVASC) 5 MG tablet TAKE 1 TABLET EVERY DAY  30 tablet  6  . aspirin 81 MG tablet Take 81 mg by mouth daily.        . dabigatran (PRADAXA) 150 MG CAPS Take 150 mg by mouth every 12 (twelve) hours.        . ENABLEX 15 MG 24 hr tablet Take 1 tablet by mouth daily.      . finasteride (PROSCAR) 5 MG tablet Take 1 tablet (5 mg total) by mouth daily.  30 tablet  11  . folic acid-pyridoxine-cyancobalamin (FOLTX) 2.5-25-2 MG TABS Take 1 tablet by mouth daily.        . Multiple Vitamin (MULTIVITAMINS PO) Take 1 tablet by mouth daily.        . naproxen (NAPROSYN) 500 MG tablet Take 500 mg by mouth 2 (two) times daily with a meal.        . pantoprazole (PROTONIX) 40 MG tablet Take 40 mg by mouth daily.        Marland Kitchen  quinapril (ACCUPRIL) 20 MG tablet TAKE 1 TABLET EVERY DAY  30 tablet  6  . simvastatin (ZOCOR) 20 MG tablet Take 1 tablet (20 mg total) by mouth at bedtime.  30 tablet  6  . traMADol (ULTRAM) 50 MG tablet Take 1 tablet (50 mg total) by mouth every 6 (six) hours as needed for pain.  30 tablet  3  . zolpidem (AMBIEN CR) 12.5 MG CR tablet A prescription was called to pharmacy for Ambien 10 mg. Take one tablet by mouth at bedtime as needed.   30 tablet  2    ROS Negative other than HPI.   PE General Appearance: well developed, well nourished in no acute distress, good skin color and HEENT: symmetrical face, PERRLA, good dentition  Neck: no JVD, thyromegaly, or adenopathy, trachea midline Chest: symmetric without deformity Cardiac: PMI non-displaced, irregular rate and rhythmnormal S1, S2, no gallop or murmur Lung: clear to ausculation and percussion Vascular: all pulses full without bruits  Abdominal: nondistended, minimal tenderness in the left  lower quadrant, bowel sounds present with no high-pitched sounds. No Murphy sign. Extremities: no cyanosis, clubbing or edema, no sign of DVT, no varicosities  Skin: normal color, no rashes Neuro: alert and oriented x 3, non-focal Pysch: normal affect Filed Vitals:   02/16/11 1556  BP: 134/70  Pulse: 65  Height: 5\' 10"  (1.778 m)  Weight: 169 lb (76.658 kg)    EKG  Labs and Studies Reviewed.   Lab Results  Component Value Date   WBC 7.9 03/19/2010   HGB 12.4* 03/19/2010   HCT 37.2* 03/19/2010   MCV 95.5 03/19/2010   PLT 150.0 03/19/2010      Chemistry      Component Value Date/Time   NA 136 03/19/2010 0000   K 4.3 03/19/2010 0000   CL 104 03/19/2010 0000   CO2 27 03/19/2010 0000   BUN 26* 03/19/2010 0000   CREATININE 1.0 03/19/2010 0000      Component Value Date/Time   CALCIUM 9.0 03/19/2010 0000   ALKPHOS 54 02/11/2009 0000   AST 25 02/11/2009 0000   ALT 23 02/11/2009 0000   BILITOT 0.6 02/11/2009 0000       Lab Results  Component Value Date   CHOL 123 02/11/2009   CHOL 122 07/27/2007   CHOL 130 08/11/2006   Lab Results  Component Value Date   HDL 56 02/11/2009   HDL 55.2 07/27/2007   HDL 55.1 08/11/2006   Lab Results  Component Value Date   LDLCALC 59 02/11/2009   LDLCALC 61 07/27/2007   LDLCALC 65 08/11/2006   Lab Results  Component Value Date   TRIG 42 02/11/2009   TRIG 31 07/27/2007   TRIG 49 08/11/2006   Lab Results  Component Value Date   CHOLHDL 2.2 Ratio 02/11/2009   CHOLHDL 2.2 CALC 07/27/2007   CHOLHDL 2.4 CALC 08/11/2006   No results found for this basename: HGBA1C   Lab Results  Component Value Date   ALT 23 02/11/2009   AST 25 02/11/2009   ALKPHOS 54 02/11/2009   BILITOT 0.6 02/11/2009   Lab Results  Component Value Date   TSH 2.430 02/11/2009  in the

## 2011-02-17 DIAGNOSIS — K3184 Gastroparesis: Secondary | ICD-10-CM | POA: Insufficient documentation

## 2011-02-18 ENCOUNTER — Emergency Department (HOSPITAL_COMMUNITY): Payer: Medicare Other

## 2011-02-18 ENCOUNTER — Inpatient Hospital Stay (HOSPITAL_COMMUNITY)
Admission: EM | Admit: 2011-02-18 | Discharge: 2011-02-26 | DRG: 384 | Disposition: A | Discharge status: Home / Self Care | Payer: Medicare Other | Attending: Internal Medicine | Admitting: Internal Medicine

## 2011-02-18 DIAGNOSIS — E785 Hyperlipidemia, unspecified: Secondary | ICD-10-CM | POA: Diagnosis present

## 2011-02-18 DIAGNOSIS — Z8673 Personal history of transient ischemic attack (TIA), and cerebral infarction without residual deficits: Secondary | ICD-10-CM

## 2011-02-18 DIAGNOSIS — R112 Nausea with vomiting, unspecified: Secondary | ICD-10-CM

## 2011-02-18 DIAGNOSIS — I10 Essential (primary) hypertension: Secondary | ICD-10-CM | POA: Diagnosis present

## 2011-02-18 DIAGNOSIS — E441 Mild protein-calorie malnutrition: Secondary | ICD-10-CM | POA: Diagnosis present

## 2011-02-18 DIAGNOSIS — N400 Enlarged prostate without lower urinary tract symptoms: Secondary | ICD-10-CM | POA: Diagnosis present

## 2011-02-18 DIAGNOSIS — I4891 Unspecified atrial fibrillation: Secondary | ICD-10-CM | POA: Diagnosis present

## 2011-02-18 DIAGNOSIS — M479 Spondylosis, unspecified: Secondary | ICD-10-CM | POA: Diagnosis present

## 2011-02-18 DIAGNOSIS — K3184 Gastroparesis: Secondary | ICD-10-CM | POA: Diagnosis present

## 2011-02-18 DIAGNOSIS — I509 Heart failure, unspecified: Secondary | ICD-10-CM | POA: Diagnosis present

## 2011-02-18 DIAGNOSIS — Z950 Presence of cardiac pacemaker: Secondary | ICD-10-CM

## 2011-02-18 DIAGNOSIS — Z951 Presence of aortocoronary bypass graft: Secondary | ICD-10-CM

## 2011-02-18 DIAGNOSIS — K625 Hemorrhage of anus and rectum: Secondary | ICD-10-CM | POA: Diagnosis present

## 2011-02-18 DIAGNOSIS — R5381 Other malaise: Secondary | ICD-10-CM | POA: Diagnosis present

## 2011-02-18 DIAGNOSIS — Z7982 Long term (current) use of aspirin: Secondary | ICD-10-CM

## 2011-02-18 DIAGNOSIS — G4733 Obstructive sleep apnea (adult) (pediatric): Secondary | ICD-10-CM | POA: Diagnosis present

## 2011-02-18 DIAGNOSIS — K259 Gastric ulcer, unspecified as acute or chronic, without hemorrhage or perforation: Principal | ICD-10-CM | POA: Diagnosis present

## 2011-02-18 DIAGNOSIS — D649 Anemia, unspecified: Secondary | ICD-10-CM | POA: Diagnosis present

## 2011-02-18 DIAGNOSIS — I2510 Atherosclerotic heart disease of native coronary artery without angina pectoris: Secondary | ICD-10-CM | POA: Diagnosis present

## 2011-02-18 DIAGNOSIS — R933 Abnormal findings on diagnostic imaging of other parts of digestive tract: Secondary | ICD-10-CM

## 2011-02-18 DIAGNOSIS — K311 Adult hypertrophic pyloric stenosis: Secondary | ICD-10-CM | POA: Diagnosis present

## 2011-02-18 LAB — COMPREHENSIVE METABOLIC PANEL
ALT: 23 U/L (ref 0–53)
Albumin: 3.8 g/dL (ref 3.5–5.2)
Alkaline Phosphatase: 73 U/L (ref 39–117)
BUN: 18 mg/dL (ref 6–23)
Chloride: 100 mEq/L (ref 96–112)
Potassium: 3.8 mEq/L (ref 3.5–5.1)
Sodium: 137 mEq/L (ref 135–145)
Total Bilirubin: 0.6 mg/dL (ref 0.3–1.2)
Total Protein: 7 g/dL (ref 6.0–8.3)

## 2011-02-18 LAB — URINALYSIS, ROUTINE W REFLEX MICROSCOPIC
Bilirubin Urine: NEGATIVE
Glucose, UA: NEGATIVE mg/dL
Protein, ur: 30 mg/dL — AB
Specific Gravity, Urine: 1.016 (ref 1.005–1.030)
Urobilinogen, UA: 1 mg/dL (ref 0.0–1.0)

## 2011-02-18 LAB — DIFFERENTIAL
Basophils Relative: 0 % (ref 0–1)
Eosinophils Absolute: 0 10*3/uL (ref 0.0–0.7)
Eosinophils Relative: 0 % (ref 0–5)
Monocytes Absolute: 0.8 10*3/uL (ref 0.1–1.0)
Monocytes Relative: 10 % (ref 3–12)

## 2011-02-18 LAB — CBC
MCH: 29.8 pg (ref 26.0–34.0)
MCHC: 33.7 g/dL (ref 30.0–36.0)
MCV: 88.4 fL (ref 78.0–100.0)
Platelets: 166 10*3/uL (ref 150–400)

## 2011-02-18 LAB — URINE MICROSCOPIC-ADD ON

## 2011-02-18 LAB — LIPASE, BLOOD: Lipase: 61 U/L — ABNORMAL HIGH (ref 11–59)

## 2011-02-18 LAB — PROTIME-INR: Prothrombin Time: 15.2 seconds (ref 11.6–15.2)

## 2011-02-18 MED ORDER — IOHEXOL 300 MG/ML  SOLN
80.0000 mL | Freq: Once | INTRAMUSCULAR | Status: AC | PRN
  Administered 2011-02-18: 80 mL via INTRAVENOUS

## 2011-02-19 ENCOUNTER — Other Ambulatory Visit: Payer: Self-pay | Admitting: Internal Medicine

## 2011-02-19 LAB — URINE CULTURE
Colony Count: NO GROWTH
Culture: NO GROWTH

## 2011-02-19 LAB — BASIC METABOLIC PANEL
BUN: 16 mg/dL (ref 6–23)
CO2: 30 mEq/L (ref 19–32)
Calcium: 9.4 mg/dL (ref 8.4–10.5)
Chloride: 101 mEq/L (ref 96–112)
Creatinine, Ser: 0.94 mg/dL (ref 0.50–1.35)
Glucose, Bld: 111 mg/dL — ABNORMAL HIGH (ref 70–99)

## 2011-02-20 ENCOUNTER — Inpatient Hospital Stay (HOSPITAL_COMMUNITY): Payer: Medicare Other

## 2011-02-20 DIAGNOSIS — R933 Abnormal findings on diagnostic imaging of other parts of digestive tract: Secondary | ICD-10-CM

## 2011-02-20 DIAGNOSIS — K3184 Gastroparesis: Secondary | ICD-10-CM

## 2011-02-20 DIAGNOSIS — R112 Nausea with vomiting, unspecified: Secondary | ICD-10-CM

## 2011-02-21 ENCOUNTER — Inpatient Hospital Stay (HOSPITAL_COMMUNITY): Payer: Medicare Other

## 2011-02-21 DIAGNOSIS — R112 Nausea with vomiting, unspecified: Secondary | ICD-10-CM

## 2011-02-21 DIAGNOSIS — R933 Abnormal findings on diagnostic imaging of other parts of digestive tract: Secondary | ICD-10-CM

## 2011-02-21 DIAGNOSIS — K3184 Gastroparesis: Secondary | ICD-10-CM

## 2011-02-21 LAB — CBC
MCV: 88.6 fL (ref 78.0–100.0)
Platelets: 154 10*3/uL (ref 150–400)
RBC: 4.14 MIL/uL — ABNORMAL LOW (ref 4.22–5.81)
RDW: 14.1 % (ref 11.5–15.5)
WBC: 10.5 10*3/uL (ref 4.0–10.5)

## 2011-02-21 LAB — BASIC METABOLIC PANEL
CO2: 27 mEq/L (ref 19–32)
Chloride: 106 mEq/L (ref 96–112)
Creatinine, Ser: 1.01 mg/dL (ref 0.50–1.35)
GFR calc Af Amer: >60 mL/min (ref >60)
Potassium: 3.7 mEq/L (ref 3.5–5.1)
Sodium: 141 mEq/L (ref 135–145)

## 2011-02-21 LAB — HEPARIN LEVEL (UNFRACTIONATED)
Heparin Unfractionated: 0.22 IU/mL — ABNORMAL LOW (ref 0.30–0.70)
Heparin Unfractionated: 0.62 IU/mL (ref 0.30–0.70)

## 2011-02-22 ENCOUNTER — Encounter: Payer: Self-pay | Admitting: Internal Medicine

## 2011-02-22 ENCOUNTER — Inpatient Hospital Stay (HOSPITAL_COMMUNITY): Payer: Medicare Other

## 2011-02-22 DIAGNOSIS — R112 Nausea with vomiting, unspecified: Secondary | ICD-10-CM

## 2011-02-22 DIAGNOSIS — R933 Abnormal findings on diagnostic imaging of other parts of digestive tract: Secondary | ICD-10-CM

## 2011-02-22 DIAGNOSIS — K257 Chronic gastric ulcer without hemorrhage or perforation: Secondary | ICD-10-CM

## 2011-02-22 DIAGNOSIS — I4891 Unspecified atrial fibrillation: Secondary | ICD-10-CM

## 2011-02-22 LAB — COMPREHENSIVE METABOLIC PANEL
BUN: 13 mg/dL (ref 6–23)
CO2: 28 mEq/L (ref 19–32)
Calcium: 9.1 mg/dL (ref 8.4–10.5)
Creatinine, Ser: 0.78 mg/dL (ref 0.50–1.35)
GFR calc Af Amer: >60 mL/min (ref >60)
GFR calc non Af Amer: >60 mL/min (ref >60)
Glucose, Bld: 119 mg/dL — ABNORMAL HIGH (ref 70–99)
Sodium: 138 mEq/L (ref 135–145)
Total Protein: 6.6 g/dL (ref 6.0–8.3)

## 2011-02-22 LAB — DIFFERENTIAL
Basophils Absolute: 0 10*3/uL (ref 0.0–0.1)
Eosinophils Relative: 4 % (ref 0–5)
Lymphocytes Relative: 19 % (ref 12–46)
Monocytes Absolute: 1.2 10*3/uL — ABNORMAL HIGH (ref 0.1–1.0)
Monocytes Relative: 11 % (ref 3–12)
Neutro Abs: 7.1 10*3/uL (ref 1.7–7.7)

## 2011-02-22 LAB — CBC
HCT: 37.5 % — ABNORMAL LOW (ref 39.0–52.0)
Hemoglobin: 12.3 g/dL — ABNORMAL LOW (ref 13.0–17.0)
Hemoglobin: 12.3 g/dL — ABNORMAL LOW (ref 13.0–17.0)
MCH: 29.4 pg (ref 26.0–34.0)
MCH: 29.7 pg (ref 26.0–34.0)
MCHC: 32.8 g/dL (ref 30.0–36.0)
MCHC: 33.5 g/dL (ref 30.0–36.0)
Platelets: 138 10*3/uL — ABNORMAL LOW (ref 150–400)
RDW: 14.1 % (ref 11.5–15.5)
RDW: 14 % (ref 11.5–15.5)

## 2011-02-22 LAB — TRIGLYCERIDES: Triglycerides: 63 mg/dL (ref <150)

## 2011-02-22 LAB — PHOSPHORUS: Phosphorus: 3.3 mg/dL (ref 2.3–4.6)

## 2011-02-22 LAB — MAGNESIUM: Magnesium: 2 mg/dL (ref 1.5–2.5)

## 2011-02-22 LAB — GLUCOSE, CAPILLARY
Glucose-Capillary: 115 mg/dL — ABNORMAL HIGH (ref 70–99)
Glucose-Capillary: 133 mg/dL — ABNORMAL HIGH (ref 70–99)
Glucose-Capillary: 136 mg/dL — ABNORMAL HIGH (ref 70–99)

## 2011-02-22 LAB — CHOLESTEROL, TOTAL: Cholesterol: 108 mg/dL (ref 0–200)

## 2011-02-23 ENCOUNTER — Encounter: Payer: Self-pay | Admitting: Internal Medicine

## 2011-02-23 ENCOUNTER — Inpatient Hospital Stay (HOSPITAL_COMMUNITY): Payer: Medicare Other

## 2011-02-23 LAB — GLUCOSE, CAPILLARY: Glucose-Capillary: 124 mg/dL — ABNORMAL HIGH (ref 70–99)

## 2011-02-23 LAB — CBC
HCT: 35.1 % — ABNORMAL LOW (ref 39.0–52.0)
Hemoglobin: 11.6 g/dL — ABNORMAL LOW (ref 13.0–17.0)
MCHC: 33 g/dL (ref 30.0–36.0)
MCV: 89.3 fL (ref 78.0–100.0)

## 2011-02-23 LAB — COMPREHENSIVE METABOLIC PANEL
ALT: 14 U/L (ref 0–53)
AST: 14 U/L (ref 0–37)
CO2: 25 mEq/L (ref 19–32)
Chloride: 105 mEq/L (ref 96–112)
Creatinine, Ser: 0.8 mg/dL (ref 0.50–1.35)
GFR calc Af Amer: >60 mL/min (ref >60)
GFR calc non Af Amer: >60 mL/min (ref >60)
Glucose, Bld: 119 mg/dL — ABNORMAL HIGH (ref 70–99)
Sodium: 139 mEq/L (ref 135–145)
Total Bilirubin: 0.5 mg/dL (ref 0.3–1.2)

## 2011-02-23 LAB — HEPARIN LEVEL (UNFRACTIONATED): Heparin Unfractionated: 0.31 IU/mL (ref 0.30–0.70)

## 2011-02-24 DIAGNOSIS — I4891 Unspecified atrial fibrillation: Secondary | ICD-10-CM

## 2011-02-24 DIAGNOSIS — K257 Chronic gastric ulcer without hemorrhage or perforation: Secondary | ICD-10-CM

## 2011-02-24 DIAGNOSIS — R112 Nausea with vomiting, unspecified: Secondary | ICD-10-CM

## 2011-02-24 LAB — GLUCOSE, CAPILLARY
Glucose-Capillary: 127 mg/dL — ABNORMAL HIGH (ref 70–99)
Glucose-Capillary: 124 mg/dL — ABNORMAL HIGH (ref 70–99)
Glucose-Capillary: 136 mg/dL — ABNORMAL HIGH (ref 70–99)
Glucose-Capillary: 99 mg/dL (ref 70–99)

## 2011-02-24 LAB — HEPARIN LEVEL (UNFRACTIONATED): Heparin Unfractionated: 0.27 IU/mL — ABNORMAL LOW (ref 0.30–0.70)

## 2011-02-24 LAB — BASIC METABOLIC PANEL
BUN: 21 mg/dL (ref 6–23)
CO2: 27 mEq/L (ref 19–32)
GFR calc non Af Amer: >60 mL/min (ref >60)
Glucose, Bld: 131 mg/dL — ABNORMAL HIGH (ref 70–99)
Potassium: 3.9 mEq/L (ref 3.5–5.1)

## 2011-02-24 LAB — CBC
HCT: 34.3 % — ABNORMAL LOW (ref 39.0–52.0)
Hemoglobin: 11.4 g/dL — ABNORMAL LOW (ref 13.0–17.0)
MCHC: 33.2 g/dL (ref 30.0–36.0)

## 2011-02-25 LAB — CBC
HCT: 32.8 % — ABNORMAL LOW (ref 39.0–52.0)
Hemoglobin: 10.9 g/dL — ABNORMAL LOW (ref 13.0–17.0)
MCV: 88.2 fL (ref 78.0–100.0)
RBC: 3.72 MIL/uL — ABNORMAL LOW (ref 4.22–5.81)
WBC: 8.8 10*3/uL (ref 4.0–10.5)

## 2011-02-25 LAB — COMPREHENSIVE METABOLIC PANEL
ALT: 23 U/L (ref 0–53)
AST: 24 U/L (ref 0–37)
Albumin: 2.8 g/dL — ABNORMAL LOW (ref 3.5–5.2)
Calcium: 9.1 mg/dL (ref 8.4–10.5)
GFR calc Af Amer: >60 mL/min (ref >60)
Total Protein: 6.1 g/dL (ref 6.0–8.3)

## 2011-02-25 LAB — GLUCOSE, CAPILLARY
Glucose-Capillary: 109 mg/dL — ABNORMAL HIGH (ref 70–99)
Glucose-Capillary: 121 mg/dL — ABNORMAL HIGH (ref 70–99)
Glucose-Capillary: 126 mg/dL — ABNORMAL HIGH (ref 70–99)
Glucose-Capillary: 128 mg/dL — ABNORMAL HIGH (ref 70–99)

## 2011-02-26 ENCOUNTER — Inpatient Hospital Stay (HOSPITAL_COMMUNITY): Payer: Medicare Other

## 2011-02-26 DIAGNOSIS — K257 Chronic gastric ulcer without hemorrhage or perforation: Secondary | ICD-10-CM

## 2011-02-26 DIAGNOSIS — R112 Nausea with vomiting, unspecified: Secondary | ICD-10-CM

## 2011-02-26 DIAGNOSIS — K3184 Gastroparesis: Secondary | ICD-10-CM

## 2011-02-26 DIAGNOSIS — I4891 Unspecified atrial fibrillation: Secondary | ICD-10-CM

## 2011-02-26 LAB — CBC
MCH: 29.3 pg (ref 26.0–34.0)
MCV: 87.7 fL (ref 78.0–100.0)
Platelets: 152 10*3/uL (ref 150–400)
RBC: 3.82 MIL/uL — ABNORMAL LOW (ref 4.22–5.81)

## 2011-02-26 LAB — PROTIME-INR
INR: 1.43 (ref 0.00–1.49)
Prothrombin Time: 17.7 seconds — ABNORMAL HIGH (ref 11.6–15.2)

## 2011-02-27 NOTE — H&P (Signed)
[name], [name]:  [id number]  MEDICAL RECORD NO.:  [id number]  LOCATION:  5152                         FACILITY:  [facility]  PHYSICIAN:  Hedwig Morton. Juanda Chance, MD     DATE OF BIRTH:  07-Jan-1926  DATE OF ADMISSION:  02/18/2011 DATE OF DISCHARGE:                             HISTORY & PHYSICAL   ADMITTING PHYSICIAN:  Hedwig Morton. Juanda Chance, MD  REASON FOR ADMISSION:  Probable gastric outlet obstruction, partial.  HISTORY OF PRESENT ILLNESS:  [name] is a pleasant 76 year old white male with a history of chronic AFib.  He is status post cardiac pacemaker.  Remotely, at least 20 years ago, he underwent a Billroth I gastric resection for peptic ulcer disease.  He has had partial small bowel obstructions periodically over the last decade, most recently in 2007.  These have responded to medical management. In fact in 2007, he did not require placement of an NG tube.  For the last year, he has been on Pradaxa for the AFib.  Since the early morning of February 16, 2011, the patient had developed acute nausea.  He threw up only once and he had pain in the left upper quadrant.  He was a bit better on Tuesday and Wednesday and was tolerating liquids with some mild residual queasiness, very mild if any discomfort in the left upper quadrant that is felt like pressure. He was anorexic however.  He had moved his bowels on Tuesday, but not since then.  He was seen yesterday in the office by Dr. Daleen Squibb.  Dr. Daleen Squibb, thought he might have a resolving partial small bowel obstruction, but the patient was not sick enough at that point to require admission.  Dr. Daleen Squibb, recommended the patient stay on clear liquids and continue all his usual medications.  His medications include daily Protonix, but also, unfortunately, he takes Naprosyn 500 mg twice a day in addition to multiple cardiac and prostate medications.  This morning, the patient had some liquids, but also trialed grits and he  vomited again.  This was the second time he has vomited in the last 3 days.  He called Dr. Daleen Squibb, and was sent to the emergency room. The x-rays showed a nonobstructive bowel gas pattern and no stomach irregularity other than the prior Billroth I.  However, a CT scan showed marked gastric distention with debris bordering on a bezoar.  He was given Zofran in the emergency room and the nausea is resolved and he is not having any pain currently.  Plan is for patient to be admitted to the service of Dr. Lina Sar for care.  PAST MEDICAL HISTORY: 1. Benign prostatic hypertrophy. 2. Hyperlipidemia. 3. Degenerative joint disease. 4. History of transient ischemic attack. 5. History of AAA, which is described as small. 6. History of second-degree AV Mobitz type 2 block.  He had placement     of a pacemaker for this. 7. History of partial small bowel obstructions managed medically in     2000, 2005, and 2007. 8. Coronary artery disease.  He had bypass surgery in 1995 and a redo     bypass in 2001. 9. Chronic atrial fibrillation. 10.Obstructive sleep apnea treated  with nocturnal CPAP. 11.History of anemia. 12.Status post right knee arthroscopy. 13.Status post removal of a lesion from the finger. 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 14.Hypertension. 15.Hyperlipidemia. 16.Status post appendectomy as well as the Billroth I gastrectomy.  ALLERGIES:  To MORPHINE and to PENICILLIN.  PENICILLIN caused rash. 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  CURRENT MEDICATIONS: 1. Ultram 50 mg one p.o. q.6 hours p.r.n., he uses this about once     weekly. 2. Enablex 15 mg p.o. daily. 3. Pradaxa 150 mg p.o. b.i.d. 4. Aspirin 81 mg p.o. daily. 5. Norvasc 5 mg p.o. daily. 6. Multivitamin once daily. 7. Foltx supplement once daily. 8. Naprosyn 500 mg twice daily. 9. Protonix 40 mg once daily. 10.Accupril 20 mg once daily. 11.Zocor  20 mg once daily. 12.Proscar 5 mg once daily.  FAMILY HISTORY:  Positive for colon cancer and coronary artery disease.  SOCIAL HISTORY:  The patient is married.  He lives in [place].  He quit smoking tobacco in 1970.  He has no children, but is very close with a niece and nephew who are almost like his children.  REVIEW OF SYSTEMS:  Constitutionally, patient does complain of weakness. He does not think he has lost a lot of weight.  He does register hunger despite his GI symptoms.  Prior to Tuesday, he was not having heartburn or GI symptoms that were worrisome and he was eating well. NEUROLOGIC:  No headaches.  He does have an unsteady gait, because of his back problems.  MUSCULOSKELETAL:  He has pain in the knee and back. DERMATOLOGIC:  No worrisome lesions.  No nonhealing ulcers. HEMATOLOGIC:  No history of transfusions.  ENDOCRINE:  No history of thyroid disease and no history of diabetes or glucose intolerance. PSYCHIATRIC:  Does have occasional insomnia and no history of seizures. GENITOURINARY:  Denies frequency.  Denies hematuria.  GI:  As described above.  The patient also denies dysphagia.  RESPIRATORY:  No shortness of breath.  No cough.  No pleuritic component to the pain. CARDIOVASCULAR:  Has not noted any palpitations or chest pain.  ENT: His mouth feels dry, but he has no sore throat.  OCULAR:  The patient uses bifocal.  DENTAL: The patient previously had a dental bridge that broke and he was scheduled to start the process of dental implants this past Tuesday, but the dental appointment was cancelled because of his GI symptoms.  Otherwise all review of systems is negative.  His prior EGD was last performed in 2002, at which time the mucosa was normal and the Billroth I anatomic changes were noted by Dr. Arlyce Dice.  LABS:  Troponin I is 0.04, hemoglobin is 12.6, hematocrit 37.4.  White blood cell count 8.4, platelets 166.  MCV 88.  Lipase is 61.  Sodium 137, potassium  3.8, chloride 100, CO2 of 29, BUN 18, creatinine 0.8. Glucose 111.  His LFTs are normal.  PT is 15.2, INR is 1.1.  UA is negative for nitrites, negative for leukocytes.  There was a small amount of macroscopic blood and 7-10 red blood cells per high-powered field.  There was a small amount of ketones at 15 mg/dL.  CT SCAN ABDOMEN & PELVIS: shows gastric distention, retained debris, can not rule out bezoar.  No evidence for small bowel obstruction and stable appearance of previously seen renal cyst.  There was extensive vascular disease noted.  ACUTE ABDOMEN SERIES: shows prior surgical changes from the CABG,  a left subclavian pacemaker positioned in left upper chest, a nonobstructive bowel gas pattern.  There is a likely osseous opacity in right upper lobe.  PHYSICAL EXAMINATION:  GENERAL:  Patient is a well-appearing elderly man who does not look his age.  He is not frail.  He is not toxic. VITAL SIGNS:  Blood pressure 126/74, pulse 91, temp 98.1, respirations 18, room air saturation is 99%.  Weight in the office yesterday was 169 pounds. HEENT:  There is no conjunctival pallor.  No jaundice and extraocular movements are intact. ENT:  Mucous membranes are slightly dry, but clear.  There was several teeth missing from the left lower jaw. NECK:  No JVD, no masses, no bruits. LUNGS:  Clear to auscultation and percussion bilaterally.  The patient is not short of breath and is not coughing.  There is a well-healed scar and palpable pacemaker in position at left upper chest near the clavicle. HEART:  There is an irregularly irregular rate.  No gallops.  No murmurs and S1, S2 clearly audible. ABDOMEN:  Soft, nontender, and nondistended.  Bowel sounds are normoactive.  No masses, no hepatosplenomegaly, no succussion splash. EXTREMITIES:  No cyanosis, clubbing, or edema.  Pedal pulses are 2 to 3+ bilaterally and the feet are warm. RECTAL:  Deferred. GENITOURINARY:  Deferred. DERMATOLOGIC:   On the lower leg and arms as well as trunk, there are no suspicious lesions. HEMATOLOGIC:  No significant purpura on the extremities or trunk. NEUROLOGIC:  The patient is alert and oriented x3.  There was no tremor present.  He moves all four limbs. Neurologically grossly intact.  IMPRESSION: 1. Nausea with limited emesis in a patient with prior Billroth I done     for history of peptic ulcer disease.  He has a history of prior     partial small bowel obstructions that resolved medically.  Now with     CT imaging suggesting gastric outlet obstruction.  He has been     using chronic Naprosyn for at least a year, so he may have     recurrent ulcer disease with possible stenosis associated.  Patient     currently stable and symptom free following single dose of Zofran     in the emergency room. 2. Minor elevation of lipase with no CT evidence for pancreatitis.     His LFTs are normal. 3. Status post cardiac pacemaker. 4. Chronic atrial fibrillation, treated with Pradaxa. 5. Degenerative joint disease for which he takes chronic Naprosyn.  PLAN:  Patient is to be admitted to Dr. Verlee Monte Brodie's Service.  We will place NG tube and perform gastric lavage in attempt to free up some of the debris accumulating in the stomach.  Perhaps tomorrow, if debris has been removed, we may be able to complete an EGD.  We will not attempt EGD today because there is so much debris that we would not be able to see much on endoscopy.  As to his medications, we will continue all necessary cardiac and prostate medications and clamp the NG tube for 1 hour after administration of these.  We will substitute oral Protonix with IV Protonix and provide IV Zofran for nausea, Morphine for pain, and Ativan as needed for anxiety or insomnia.     Jennye Moccasin, PA-C   ______________________________ Hedwig Morton. Juanda Chance, MD    SG/MEDQ  D:  02/18/2011  T:  02/19/2011  Job:  [id number]  Electronically Signed by Jennye Moccasin PA-C on 02/19/2011 10:00:56 AM Electronically Signed  by Lina Sar MD on 02/27/2011 11:44:12 PM

## 2011-03-01 ENCOUNTER — Encounter: Payer: Self-pay | Admitting: Cardiology

## 2011-03-01 ENCOUNTER — Ambulatory Visit (INDEPENDENT_AMBULATORY_CARE_PROVIDER_SITE_OTHER): Payer: Medicare Other | Admitting: *Deleted

## 2011-03-01 ENCOUNTER — Telehealth: Payer: Self-pay

## 2011-03-01 ENCOUNTER — Ambulatory Visit (INDEPENDENT_AMBULATORY_CARE_PROVIDER_SITE_OTHER): Payer: Medicare Other | Admitting: Cardiology

## 2011-03-01 VITALS — BP 118/62 | HR 65 | Ht 70.0 in | Wt 167.0 lb

## 2011-03-01 DIAGNOSIS — Z7901 Long term (current) use of anticoagulants: Secondary | ICD-10-CM

## 2011-03-01 DIAGNOSIS — I4891 Unspecified atrial fibrillation: Secondary | ICD-10-CM

## 2011-03-01 LAB — PROTIME-INR
INR: 4.89 — ABNORMAL HIGH (ref <1.50)
Prothrombin Time: 47 seconds — ABNORMAL HIGH (ref 11.6–15.2)

## 2011-03-01 NOTE — Patient Instructions (Addendum)
Low residue diet.  Please see handout given today.  Your physician recommends that you have lab work today

## 2011-03-01 NOTE — Telephone Encounter (Signed)
Wife states no nausea or vomiting. He is still on a full liquid diet.  Wife states that no BM since leaving the hospital.  He is getting stronger according to the wife.  She is taking him to the coumadin clinic now for his coumadin check.  Please advise if ok to advance diet.

## 2011-03-01 NOTE — Assessment & Plan Note (Signed)
Stable.  Continue current medications.

## 2011-03-01 NOTE — Telephone Encounter (Signed)
Message copied by Annett Fabian on Mon Mar 01, 2011  1:35 PM ------      Message from: Stan Head E      Created: Mon Mar 01, 2011 10:05 AM      Regarding: follow-up       Please call patient or wife to see how he is doing (nausea and vomiting) and what diet he was on. Was discharged Friday.Had anticipated them calling this weekend but they did not so hope it went well.

## 2011-03-01 NOTE — Assessment & Plan Note (Signed)
Now on Coumadin instead of Pradaxa. INR is high, will confirm with lab draw. Also check CBC with ulcerations on endoscopy. I suspect he is not bleeding.

## 2011-03-01 NOTE — Progress Notes (Signed)
HPI Eric [name] comes in today for close followup after being discharged the hospital this past Friday for severe gastroparesis and gastric outlet syndrome.  He is tolerating full liquids to a soft diet to this point. He really would like to advance his diet. He is now on Coumadin since there was concern that Pradaxa can exacerbate gastroparesis.  He denies any nausea or vomiting. He is passing gas below. He denies any melena.  His INR is 8 today. We'll confirm with the lab. He's been on 7.5 mg of Coumadin per day. Coumadin clinic aware. Past Medical History  Diagnosis Date  . BPH (benign prostatic hypertrophy)   . DJD (degenerative joint disease)   . Hx of transient ischemic attack (TIA)   . AAA (abdominal aortic aneurysm)   . Cerebrovascular disease   . HLD (hyperlipidemia)   . HTN (hypertension)   . Second degree AV block, Mobitz type II     s/p PPM  . CAD (coronary artery disease)   . Persistent atrial fibrillation     on pradaxa    Past Surgical History  Procedure Date  . Pacemaker insertion 01/24/10    Adapta Medtronic   . Coronary artery bypass graft   . Appendectomy   . Gastrectomy     Family History  Problem Relation Age of Onset  . Coronary artery disease    . Colon cancer      History   Social History  . Marital Status: Married    Spouse Name: N/A    Number of Children: N/A  . Years of Education: N/A   Occupational History  . retired    Social History Main Topics  . Smoking status: Former Smoker    Quit date: 06/07/1968  . Smokeless tobacco: Not on file  . Alcohol Use: No  . Drug Use: Not on file  . Sexually Active: Not on file   Other Topics Concern  . Not on file   Social History Narrative  . No narrative on file    Allergies  Allergen Reactions  . Morphine   . Penicillins     Current Outpatient Prescriptions  Medication Sig Dispense Refill  . amLODipine (NORVASC) 5 MG tablet TAKE 1 TABLET EVERY DAY  30 tablet  6  . aspirin 81 MG  tablet Take 81 mg by mouth daily.        . ENABLEX 15 MG 24 hr tablet Take 1 tablet by mouth daily.      . finasteride (PROSCAR) 5 MG tablet Take 1 tablet (5 mg total) by mouth daily.  30 tablet  11  . folic acid-pyridoxine-cyancobalamin (FOLTX) 2.5-25-2 MG TABS Take 1 tablet by mouth daily.        . Multiple Vitamin (MULTIVITAMINS PO) Take 1 tablet by mouth daily.        . pantoprazole (PROTONIX) 40 MG tablet Take 40 mg by mouth daily.        . quinapril (ACCUPRIL) 20 MG tablet TAKE 1 TABLET EVERY DAY  30 tablet  6  . simvastatin (ZOCOR) 20 MG tablet Take 1 tablet (20 mg total) by mouth at bedtime.  30 tablet  6  . traMADol (ULTRAM) 50 MG tablet Take 1 tablet (50 mg total) by mouth every 6 (six) hours as needed for pain.  30 tablet  3  . warfarin (COUMADIN) 5 MG tablet Take 5 mg by mouth. As directed by coumadin clinic       . zolpidem (AMBIEN CR) 12.5 MG  CR tablet A prescription was called to pharmacy for Ambien 10 mg. Take one tablet by mouth at bedtime as needed.   30 tablet  2    ROS Negative other than HPI.   PE General Appearance: well developed, well nourished in no acute distress, good color HEENT: symmetrical face, PERRLA, good dentition  Neck: no JVD, thyromegaly, or adenopathy, trachea midline Chest: symmetric without deformity Cardiac: PMI non-displaced, RRR, normal S1, S2, no gallop or murmur Lung: clear to ausculation and percussion Vascular: all pulses full without bruits  Abdominal: nondistended, nontender, good bowel sounds, no HSM, no bruits Extremities: no cyanosis, clubbing or edema, no sign of DVT, no varicosities  Skin: normal color, no rashes Neuro: alert and oriented x 3, non-focal Pysch: normal affect Filed Vitals:   03/01/11 1502  BP: 118/62  Pulse: 65  Height: 5\' 10"  (1.778 m)  Weight: 167 lb (75.751 kg)    EKG  Labs and Studies Reviewed.   Lab Results  Component Value Date   WBC 7.9 02/26/2011   HGB 11.2* 02/26/2011   HCT 33.5* 02/26/2011   MCV  87.7 02/26/2011   PLT 152 02/26/2011      Chemistry      Component Value Date/Time   NA 134* 02/25/2011 0659   K 3.8 02/25/2011 0659   CL 102 02/25/2011 0659   CO2 26 02/25/2011 0659   BUN 28* 02/25/2011 0659   CREATININE 0.79 02/25/2011 0659      Component Value Date/Time   CALCIUM 9.1 02/25/2011 0659   ALKPHOS 66 02/25/2011 0659   AST 24 02/25/2011 0659   ALT 23 02/25/2011 0659   BILITOT 0.3 02/25/2011 0659       Lab Results  Component Value Date   CHOL 108 02/22/2011   CHOL 123 02/11/2009   CHOL 122 07/27/2007   Lab Results  Component Value Date   HDL 56 02/11/2009   HDL 55.2 07/27/2007   HDL 55.1 08/11/2006   Lab Results  Component Value Date   LDLCALC 59 02/11/2009   LDLCALC 61 07/27/2007   LDLCALC 65 08/11/2006   Lab Results  Component Value Date   TRIG 63 02/22/2011   TRIG 42 02/11/2009   TRIG 31 07/27/2007   Lab Results  Component Value Date   CHOLHDL 2.2 Ratio 02/11/2009   CHOLHDL 2.2 CALC 07/27/2007   CHOLHDL 2.4 CALC 08/11/2006   No results found for this basename: HGBA1C   Lab Results  Component Value Date   ALT 23 02/25/2011   AST 24 02/25/2011   ALKPHOS 66 02/25/2011   BILITOT 0.3 02/25/2011   Lab Results  Component Value Date   TSH 2.430 02/11/2009

## 2011-03-02 LAB — CBC WITH DIFFERENTIAL/PLATELET
Eosinophils Absolute: 0.2 10*3/uL (ref 0.0–0.7)
Lymphocytes Relative: 16.9 % (ref 12.0–46.0)
MCHC: 32.8 g/dL (ref 30.0–36.0)
MCV: 91.4 fl (ref 78.0–100.0)
Monocytes Absolute: 1 10*3/uL (ref 0.1–1.0)
Neutrophils Relative %: 70.8 % (ref 43.0–77.0)
Platelets: 174 10*3/uL (ref 150.0–400.0)
WBC: 9.5 10*3/uL (ref 4.5–10.5)

## 2011-03-02 NOTE — Telephone Encounter (Signed)
I spoke with the patient's wife.  I explained step 3 diet and have mailed her a copy.  I have also rescheduled an appt for 03/10/11.  They will call for any questions

## 2011-03-02 NOTE — Telephone Encounter (Signed)
I discussed with Dr. Daleen Squibb and oked advancing diet - would like him on a step 3 gastroparesis diet - please send to them  Also needs appointment with me next week or so

## 2011-03-02 NOTE — Telephone Encounter (Signed)
Left message for patient or his wife to call back.

## 2011-03-03 ENCOUNTER — Telehealth: Payer: Self-pay | Admitting: Internal Medicine

## 2011-03-03 ENCOUNTER — Emergency Department (HOSPITAL_COMMUNITY): Payer: Medicare Other

## 2011-03-03 ENCOUNTER — Emergency Department (HOSPITAL_COMMUNITY)
Admission: EM | Admit: 2011-03-03 | Discharge: 2011-03-03 | Disposition: A | Discharge status: Home / Self Care | Payer: Medicare Other | Attending: Emergency Medicine | Admitting: Emergency Medicine

## 2011-03-03 DIAGNOSIS — Z8673 Personal history of transient ischemic attack (TIA), and cerebral infarction without residual deficits: Secondary | ICD-10-CM | POA: Insufficient documentation

## 2011-03-03 DIAGNOSIS — Z950 Presence of cardiac pacemaker: Secondary | ICD-10-CM | POA: Insufficient documentation

## 2011-03-03 DIAGNOSIS — M1990 Unspecified osteoarthritis, unspecified site: Secondary | ICD-10-CM | POA: Insufficient documentation

## 2011-03-03 DIAGNOSIS — R791 Abnormal coagulation profile: Secondary | ICD-10-CM | POA: Insufficient documentation

## 2011-03-03 DIAGNOSIS — T45515A Adverse effect of anticoagulants, initial encounter: Secondary | ICD-10-CM | POA: Insufficient documentation

## 2011-03-03 DIAGNOSIS — E785 Hyperlipidemia, unspecified: Secondary | ICD-10-CM | POA: Insufficient documentation

## 2011-03-03 DIAGNOSIS — R1110 Vomiting, unspecified: Secondary | ICD-10-CM | POA: Insufficient documentation

## 2011-03-03 DIAGNOSIS — K920 Hematemesis: Secondary | ICD-10-CM

## 2011-03-03 DIAGNOSIS — K259 Gastric ulcer, unspecified as acute or chronic, without hemorrhage or perforation: Secondary | ICD-10-CM

## 2011-03-03 DIAGNOSIS — R040 Epistaxis: Secondary | ICD-10-CM | POA: Insufficient documentation

## 2011-03-03 DIAGNOSIS — I10 Essential (primary) hypertension: Secondary | ICD-10-CM | POA: Insufficient documentation

## 2011-03-03 LAB — BASIC METABOLIC PANEL
Calcium: 9.3 mg/dL (ref 8.4–10.5)
GFR calc non Af Amer: >60 mL/min (ref >60)
Glucose, Bld: 122 mg/dL — ABNORMAL HIGH (ref 70–99)
Sodium: 135 mEq/L (ref 135–145)

## 2011-03-03 LAB — DIFFERENTIAL
Basophils Absolute: 0 10*3/uL (ref 0.0–0.1)
Basophils Relative: 0 % (ref 0–1)
Eosinophils Absolute: 0.1 10*3/uL (ref 0.0–0.7)
Eosinophils Relative: 1 % (ref 0–5)

## 2011-03-03 LAB — CBC
Platelets: 215 10*3/uL (ref 150–400)
RDW: 13.7 % (ref 11.5–15.5)
WBC: 8.9 10*3/uL (ref 4.0–10.5)

## 2011-03-03 LAB — PROTIME-INR
INR: 3.47 — ABNORMAL HIGH (ref 0.00–1.49)
Prothrombin Time: 35.4 seconds — ABNORMAL HIGH (ref 11.6–15.2)

## 2011-03-03 NOTE — Telephone Encounter (Signed)
I returned call to the patient's wife.  She reports that the patient was bleeding from the nose and mouth and they could not control the bleeding.  He also had an episode of vomiting that was bloody.  The patient is on coumadin.  She is also trying to reach Dr Vern Claude office.  Patient denies any SOB, CP, dizziness or other symptoms at the time.  I have asked her to take him to the ER now for evaluation.  She states she is going to call an ambulance as the bleeding is increasing.

## 2011-03-04 NOTE — Telephone Encounter (Signed)
I discussed with Dr. Daleen Squibb and Dr. Russella Dar yesterday and patient was evaluated

## 2011-03-05 ENCOUNTER — Emergency Department (HOSPITAL_COMMUNITY)
Admission: EM | Admit: 2011-03-05 | Discharge: 2011-03-05 | Disposition: A | Discharge status: Home / Self Care | Payer: Medicare Other | Attending: Emergency Medicine | Admitting: Emergency Medicine

## 2011-03-05 DIAGNOSIS — I4891 Unspecified atrial fibrillation: Secondary | ICD-10-CM | POA: Insufficient documentation

## 2011-03-05 DIAGNOSIS — Z951 Presence of aortocoronary bypass graft: Secondary | ICD-10-CM | POA: Insufficient documentation

## 2011-03-05 DIAGNOSIS — D649 Anemia, unspecified: Secondary | ICD-10-CM | POA: Insufficient documentation

## 2011-03-05 DIAGNOSIS — R040 Epistaxis: Secondary | ICD-10-CM | POA: Insufficient documentation

## 2011-03-05 DIAGNOSIS — Z7901 Long term (current) use of anticoagulants: Secondary | ICD-10-CM | POA: Insufficient documentation

## 2011-03-05 DIAGNOSIS — I10 Essential (primary) hypertension: Secondary | ICD-10-CM | POA: Insufficient documentation

## 2011-03-05 LAB — CBC
MCH: 29.4 pg (ref 26.0–34.0)
Platelets: 255 10*3/uL (ref 150–400)
RBC: 3.37 MIL/uL — ABNORMAL LOW (ref 4.22–5.81)
WBC: 11.7 10*3/uL — ABNORMAL HIGH (ref 4.0–10.5)

## 2011-03-05 LAB — ABO/RH: ABO/RH(D): A POS

## 2011-03-05 LAB — TYPE AND SCREEN: ABO/RH(D): A POS

## 2011-03-08 ENCOUNTER — Encounter: Payer: Medicare Other | Admitting: *Deleted

## 2011-03-10 ENCOUNTER — Encounter: Payer: Self-pay | Admitting: Internal Medicine

## 2011-03-10 ENCOUNTER — Ambulatory Visit (INDEPENDENT_AMBULATORY_CARE_PROVIDER_SITE_OTHER): Payer: Medicare Other | Admitting: Internal Medicine

## 2011-03-10 ENCOUNTER — Ambulatory Visit (INDEPENDENT_AMBULATORY_CARE_PROVIDER_SITE_OTHER)
Admission: RE | Admit: 2011-03-10 | Discharge: 2011-03-10 | Disposition: A | Discharge status: Home / Self Care | Payer: Medicare Other | Source: Ambulatory Visit | Attending: Internal Medicine | Admitting: Internal Medicine

## 2011-03-10 ENCOUNTER — Other Ambulatory Visit: Payer: Medicare Other | Admitting: Unknown Physician Specialty

## 2011-03-10 DIAGNOSIS — J984 Other disorders of lung: Secondary | ICD-10-CM

## 2011-03-10 DIAGNOSIS — K3184 Gastroparesis: Secondary | ICD-10-CM

## 2011-03-10 DIAGNOSIS — K253 Acute gastric ulcer without hemorrhage or perforation: Secondary | ICD-10-CM

## 2011-03-10 DIAGNOSIS — R911 Solitary pulmonary nodule: Secondary | ICD-10-CM

## 2011-03-10 NOTE — Progress Notes (Signed)
  Subjective:    Patient ID: Eric [name], male    DOB: Nov 16, 1925, 77 y.o.   MRN: [id number]  HPI Had vomiting yesterday. He had scrambled eggs and toast in the morning and then some nab peanut butter crackers in the afternoon and then vomited after that. Chicken broth and rice for dinner and that came up as well. The vomiting occurred hours after he ate. He still has nose packing in from epistaxis trouble he had last week while his INR was markedly elevated. He has been on clindamycin since the nasal packing was placed. In general he has not been nauseous. He has returned to work. He spends an hour to work at a time. He did not eat at all this morning. Is moving his bowels, stools are brown (dark - not melenic) Here with wife and niece.   Review of Systems Weak, using a walker. Was not this way prior to the hospitalization though was feeling sick in the days and weeks before (not a sudden onset of problems). Had epistaxis last week with elevated INR and right nasal packing placed - no bleeding since    Objective:   Physical Exam Elderly and weak-appearing Lungs are clear Heart s1 and s2 no gallop or murmur Abdomen with loose skin, BS +, no splash, non-tender Ext no edema Awake and alert       Assessment & Plan:

## 2011-03-10 NOTE — Assessment & Plan Note (Signed)
Plan to reassess with EGD. Trying to understand what is causing his recurrent nausea and vomiting.

## 2011-03-10 NOTE — Assessment & Plan Note (Signed)
Unclear as to why he has acute dilation and stasis of stomach. No history of prior viral syndrome. He had a 3 mm pulmonary nodule on a chest CT Fall 2011- I have seen paraneoplastic syndromes do this so will check CXR for lung mass/nodule. Could need repeat Chest CT but this is a reach. Stay on step 1-2 of gastroparesis diet and force fluids. Repeat EGD 10/5.

## 2011-03-10 NOTE — Consult Note (Signed)
Eric [name], [name] NO.:  [id number]  MEDICAL RECORD NO.:  [id number]  LOCATION:  [facility]                         FACILITY:  [facility]  PHYSICIAN:  Eric Klaus H. Pollyann Kennedy, MD     DATE OF BIRTH:  1926/03/16  DATE OF CONSULTATION:  03/05/2011 DATE OF DISCHARGE:  03/05/2011                                CONSULTATION   SITE:  [name].  REASON FOR CONSULTATION:  Intractable epistaxis.  HISTORY:  This is an 77 year old gentleman with history of atrial fibrillation.  He was started on Coumadin about 2 weeks ago.  He started having severe nosebleed a few days ago.  He has been in and out of emergency room for that.  He recently was discharge from the hospital for gastroschisis.  He was treated by Dr. Leone [name].  His primary care physician is Dr. Valera [name].  He is currently off the Coumadin.  His INR was 5.7 earlier in the week, 3.4 a couple of days ago, and 2.18 earlier today.  He is a personal friend of Dr. Linus [name] in [place], who placed a small pack in the right anterior nasal cavity couple of days ago, but he is continued to bleed.  Remainder of his past medical and surgical history is in the medical record.  PHYSICAL EXAMINATION:  GENERAL:  Healthy, very pleasant elderly gentleman, in no distress. HEENT:  There is a lot of dried blood around the nasal cavities and oral cavities, but no active bleeding.  Oral cavity and pharynx were clear, otherwise.  No masses.  Dentition in pretty good repair.  No palpable neck masses.  External ears are healthy.  Nasal exam reveals a small pack in the right anterior nasal cavity that was easily removed.  There was some crusting in the left side, that was cleaned out, but no active bleeding.  On the right side, the mucosa was anesthetized topically with Afrin and Xylocaine solution.  The nasal cavity was suctioned out of all debris and there is a minor active bleed in the right anterior septum.  The  septum, the inferior middle turbinates were all injected with Xylocaine with epinephrine solution. The anterior septum on the right was cauterized with silver nitrate.  A small Merocel pack was then placed on the right side and inflated with the local anesthetic solution.  He was observed for short while in the emergency department and then discharged home.  I contacted Dr. Claudette [name], who is covering for Dr. Leone [name], who is out of town to make sure that it is okay to put him on antibiotics with his recent GI problems and that was okayed by Dr. Russella [name].  I will put him on clindamycin 150 mg t.i.d. for five days and plan to have him back to the office on Wednesday to remove the packing, but to contact me sooner if there is any additional bleeding.     Eric Sohail H. Pollyann Kennedy, MD     JHR/MEDQ  D:  03/05/2011  T:  03/06/2011  Job:  [id number]  cc:   Eric Boop, MD,FACG [facility] [address] [place], [place] [address]  Eric Sans. Wall, MD, FACC [address] [place] [address]  Electronically Signed by Eric Colonel MD on 03/10/2011 09:32:09 PM

## 2011-03-10 NOTE — Patient Instructions (Signed)
You have been scheduled for an Endoscopy with separate instructions given. Please go to the basement upon leaving today to have your xray done. Follow the step 2 and 3 gastroparesis diet handout. Drink Boost and/or Ensure as directed.

## 2011-03-11 ENCOUNTER — Telehealth: Payer: Self-pay | Admitting: Internal Medicine

## 2011-03-11 NOTE — Discharge Summary (Signed)
  Eric [name], Eric [name] NO.:  [id number]  MEDICAL RECORD NO.:  [id number]  LOCATION:  5152                         FACILITY:  [facility]  PHYSICIAN:  Iva Boop, MD,FACGDATE OF BIRTH:  Nov 10, 1925  DATE OF ADMISSION:  02/18/2011 DATE OF DISCHARGE:  02/26/2011                              DISCHARGE SUMMARY   ADDENDUM:  Just prior to discharge, the patient had a single episode of emesis of undigested foods.  This occurred within a couple of hours of his breakfast meal and just after he had taken pills and a dose of MiraLAX.  There was no blood in the emesis.  The patient had a bowel movement earlier in the morning.  He was not complaining of any belly pain.  The patient's discharge plans were placed on hold and an abdominal film was ordered.  The KUB showed gastric distention with retained food debris within the stomach.  There was scattered stool throughout the colon.  Overall, bowel and stomach gas pattern was non-obstructive.  The patient did not have any recurrent nausea or vomiting further in the morning into the mid afternoon.  Dr. Leone Payor reviewed the abdominal films.  Decision was made to release the patient to home as had been planned.  The patient's wife was aware that if he had any further problems, she should call back.  Dietary instructions were changed to allow only clear and full liquids.  He was not to consume any large amounts of volume more than 12 ounces of fluid every 2-3 hours.  Until further notice and directions from Dr. Leone Payor, he was not to consume solid foods.  He was allowed to take oral medications.  A plan was made for Mrs. Blackwell, the patient's wife, to contact Dr. Leone Payor in the morning to report the patient's status.  She was to call regardless of whether the patient was doing well or had had recurrent symptoms.  Of course, if he had significant recurrent vomiting, she is to contact GI and chances are she will need to bring him  back to the hospital.     Jennye Moccasin, PA-C   ______________________________ Iva Boop, MD,FACG    SG/MEDQ  D:  02/26/2011  T:  02/26/2011  Job:  [id number]  Electronically Signed by Jennye Moccasin PA-C on 03/01/2011 11:31:11 AM Electronically Signed by Stan Head MDFACG on 03/11/2011 10:04:02 AM

## 2011-03-11 NOTE — Discharge Summary (Signed)
Eric [name], Eric [name] NO.:  [id number]  MEDICAL RECORD NO.:  [id number]  LOCATION:  5152                         FACILITY:  [facility]  PHYSICIAN:  Iva Boop, MD,FACGDATE OF BIRTH:  [birth year]  DATE OF ADMISSION:  02/18/2011 DATE OF DISCHARGE:  02/26/2011                              DISCHARGE SUMMARY   ADMITTING DIAGNOSES: 1. Nausea, limited emesis in the patient status post remote Billroth I     performed secondary to history of peptic ulcer disease.  The     patient has been taking chronic Protonix; however, he has also been     taking chronic Naprosyn for at least a year. 2. Minor elevation of lipase.  No CT evidence for pancreatitis, LFTs     normal. 3. History of chronic atrial fibrillation for which he is managed     with Pradaxa. 4. Small abdominal aortic aneurysm, stable by CT scan. 5. 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  Previously underwent permanent pacemaker placement. 6. History of partial small bowel obstructions managed medically in     2000, 2005 and 2007. 7. Coronary artery disease, status post bypass surgery/coronary artery     bypass graft in 1995 with redo bypass in 2001. 8. Obstructive sleep apnea treated with nocturnal continuous positive     airway pressure. 9. Hyperlipidemia. 10.Degenerative joint disease with significant burden of spinal pain. 11.Benign prostatic hypertrophy. 12.History of transient ischemic attack. 13.History of anemia. 14.Hypertension. 15.Status post appendectomy. 16.Status post Billroth I gastrectomy. 17.Status post right knee arthroscopy. 18.Status post removal of finger lesion, pathology showed squamous     cell in situ.  ALLERGIES: 1. MORPHINE causing confusion. 2. HEPARIN possibly having cause heparin-induced thrombocytopenia in     2005. 3. PENICILLIN, which caused a rash.  DISCHARGE DIAGNOSES: 1. Nausea, vomiting and probable gastric outlet obstruction  owing to     ulcers at the gastrojejunostomy anastomosis.  Ulcers secondary to     Naprosyn.  No pathology evidence for Helicobacter pylori. Question     if gastroparesis is causative as well. 2. History of partial small-bowel obstruction on prior occasions over     the last two decades.  No evidence currently for partial small-     bowel obstruction. 3. Normocytic anemia. 4. Rectal bleeding, which is intermittent, small in volume.  Episode     of rectal bleeding this admission has resolved with suppository     therapy and laxatives. 5. Protein malnutrition, treated for several days with total nutrient     admixture. 6. Atrial fibrillation.  Pradaxa discontinued and the patient     initiated on Coumadin, not yet therapeutic, INR of 1.4 at     discharge. 7. Obstructive sleep apnea. 8. Degenerative joint disease and osteoarthritis with significant     spinal pain.  Naprosyn discontinued. 9. Small, stable 3.6-mm abdominal aortic aneurysm. 10.Likely gastroparesis. 11.Confusion/sundowning worsened by an acute illness as well as     antiemetics and lorazepam.  PROCEDURES:  Upper endoscopy on February 19, 2011 by Dr. Lina Sar. Report showed retained food, multiple ulcers at the gastrojejunostomy anastomosis, changes consistent with his Billroth I gastrojejunostomy, no  evidence for outlet obstruction, though a large amount of food retained in the stomach and refluxing into the esophagus.  Consultations with pharmacy for management of Coumadin and heparin therapy with Dr. Daleen Squibb for cardiac medication management.  MEDICATIONS AT DISCHARGE: 1. MiraLax powder 17 grams by mouth daily. 2. Coumadin 5 mg tablet one and a half tablets daily alternating with     one tablet daily switching every other day. 3. Accupril 20 mg one q.a.m. 4. Enteric-coated aspirin 81 mg once daily. 5. Colace 100 mg once daily at bedtime. 6. Enablex 15 mg once daily at bedtime. 7. Finasteride 5 mg once daily at  bedtime. 8. Foltx 2.5 mg once every morning. 9. Multivitamin one tablet every morning. 10.Amlodipine 5 mg once every morning. 11.Protonix (pantoprazole) 40 mg once every morning. 12.Tramadol 50 mg one tablet every 6 hours p.r.n. pain. 13.Zocor 20 mg once daily at bedtime.  DISCONTINUED MEDICATIONS:  Naprosyn as well as Pradaxa.  LABORATORY STUDIES:  Hemoglobin 11.2, hematocrit 33.5, MCV 87.7, white blood cell count 7.9, platelets 152,000.  Heparin level on February 26, 2011 0.85.  PT 7.7, INR 1.43 on February 26, 2011.  Magnesium level 2.1.  Total bilirubin 0.3, alkaline phosphatase 66, AST 24, ALT 23. Albumin 2.8.  Phosphorus level 4.2.  Lipase level 61.  Prealbumin level 12.4.  Urinalysis negative.  Urine culture, no growth.  IMAGING STUDIES: 1. On February 18, 2011, acute abdominal series showed no evidence     for acute cardiopulmonary disease, stable cardiomegaly.  No     evidence for bowel obstruction or free air. 2. On February 18, 2011, CT scan of abdomen and pelvis with contrast     showing marked gastric distention with retained gastric debris,     cannot rule out bezoar.  Cannot rule out at least partial gastric     outlet obstruction. Extensive atherosclerotic vascular     calcifications noted.  No small bowel obstruction.  Stable     bilateral renal cysts without hydronephrosis or hydroureter. 3. On February 20, 2011, upper GI series; large amount of debris     noted within the dilated stomach.  Findings suspicious for severe     gastroparesis and/or gastric outlet obstruction. 4. Chest x-ray on February 21, 2011 following PICC line showed new     right upper extremity PICC, no evidence for pneumonia and     cardiomegaly without heart failure. 5. On February 21, 2011, abdominal film showed moderately distended     debris filled stomach possibly mildly improved compared with prior     films, small amount of residual barium in the proximal stomach. 6. KUB on  February 23, 2011, a peculiar lucency beneath left     hemidiaphragm possibly representing subsegmental atelectasis,     though unable to rule out pneumoperitoneum.  Small amount of     residual barium in the gastric fundus with no evidence for     obstruction of the bowel. 7. On February 23, 2011, decubitus film of the abdomen showed no     evidence for bowel obstruction or pneumoperitoneum, noted     incidentally were extensive degenerative changes in the spine along     with lumbar scoliosis.  BRIEF HISTORY:  Mr. Levitz is a pleasant 77 year old white male, he is  a cardiac and primary care patient of Dr. Valera Castle.  He takes Pradaxa for atrial fibrillation as well as a history of a TIA.  He had remote history of peptic ulcer disease for  which, he underwent a Billroth I gastrojejunostomy.  Over the last 12-15 years, he has had three or more admissions for medically managed partial small bowel obstructions attributed to adhesive disease owing to prior surgeries.  On February 16, 2011, he developed acute nausea and threw up just once. He had pain in the left upper quadrant.  By the next day, Tuesday and then Wednesday, symptoms had improved and he was tolerating liquids with a little mild queasiness and very minimal pressure-like discomfort in the left upper quadrant, but he was anorexic during these days.  He was moving his bowels.  He was seen by Dr. Daleen Squibb on February 16, 2011, and Dr. Daleen Squibb recommended he stick to a diet of clear liquids for the next 24 hours.  The patient developed acute worsening of his symptoms on the morning of February 18, 2011, and was sent to emergency room for GI to evaluate and admit for what was thought to be another bowel obstruction. Initially, the x-ray did not show evidence for bowel obstruction.  A CT scan performed did demonstrate marked gastric distention, retained gastric debris, and possibly a bezoar.  He was admitted to the service of Dr.  Lina Sar for further management.  Crucial to the history is that the patient has been using Naprosyn regularly to address musculoskeletal pain.  He does take a protective GI medication, Protonix daily.  He had not had any melena.  Vomiting had been limited to earlier in the week and then a few times at home prior to his coming into the emergency room and neither he nor his family reported coffee-ground or bloody looking emesis.  HOSPITAL COURSE: 1. Nausea and vomiting likely due to gastric outlet obstruction.  The     patient was treated both with medications as well as NG tube     suction and limited p.o. intake as well as IV fluids.  EGD was     performed and revealed ulcers at the gastrojejunostomy anastomosis.     The biopsy of the area showed acute ulcer, extensive intestinal     metaplasia, and inflammatory debris, but no evidence for     Helicobacter pylori, dysplasia, or malignancy.  The ulcers are felt     secondary to the Naprosyn.  Naprosyn was discontinued.  He was     treated first with IV Protonix and then later transitioned back to     oral Protonix.  He briefly received oral Carafate. 2. Atrial fibrillation, medications for this were 81 mg aspirin and     Pradaxa prior to admission.  Pradaxa was discontinued.  Aspirin was     held, but ultimately plan is to restart aspirin when he returns     home.  He was started on Coumadin therapy, dosing managed by     pharmacy.  At the time of this dictation, his INR was not yet     therapeutic.  Dosing guidelines were outlined by Pharmacy. 3. Minor rectal bleeding.  After the patient had a hard to pass stool,     he noted rectal bleeding.  This has happened intermittently at     home.  He was treated for 3 days with Anusol HC suppositories and     MiraLax was added to his medical regimen.  The bleeding resolved. 4. Protein malnutrition with low prealbumin.  The patient was treated     from February 21, 2011 through February 26, 2020, with central     total nutrient admixture.  Because of the gastric outlet     obstruction, his oral intake was limited to medications only.  In     the last 36 hours of the admission, he was tolerating first liquids     and then solid foods without problems.  The patient required     placement of a PICC line for administration of the total nutrient     admixture. 5. Obstructive sleep apnea.  Because the patient had an NG tube placed     during part of the hospital stay, he was unable to use the     continuous positive airway pressure.  He noted daytime drowsiness     as a result of nonuse of the continuous positive airway pressure.     However, once continuous positive airway pressure was reinitiated,     his daytime drowsiness resolved. 6. Spinal pain owing to osteoarthritis/degenerative joint disease.     The patient has a significant level of chronic spinal pain.     Naprosyn was discontinued.  He was treated with p.r.n. Ultram,     which provides some relief and which he had been using p.r.n. at     home as well. 7. Debilitation, weakness.  The patient worked with occupational and     physical therapy for strength training.  They provided him with a     walker.  Over the last few days, his strength has improved and he     and his family felt comfortable taking him home. 8. Sundowning/confusion.  This was a problem early on in the     hospitalization where he would get confused overnight.  It was     worse after he received a dose of lorazepam early on.  Eventually,     it resolved and the family took the initiative to stay with the     patient at night, which greatly helped matters.  PLANS:  The patient has followup appointments with Dr. Daleen Squibb for both Coumadin Clinic as well as a MD visit on March 01, 2011.  He has a followup appointment with Dr. Stan Head on March 31, 2011.  DIET AT DISCHARGE:  Vitamin K restricted, no added salt diet.  CONDITION AT  DISCHARGE:  Stable and improved.  ADDENDUM:  At approximately 11:20 a.m. on the day of discharge, the patient's nurse reported to the GI Service that the patient had thrown up a moderately large volume of emesis.  He was not having any abdominal pain, he had had a brown formed stool prior to this episode of emesis. At this point, the patient's discharge has now been cancelled.  We will plan to get abdominal films and regress his diet back and keep him in the hospital for the time being.     Jennye Moccasin, PA-C   ______________________________ Iva Boop, MD,FACG    SG/MEDQ  D:  02/26/2011  T:  02/26/2011  Job:  [id number]  cc:   Thomas C. Daleen Squibb, MD, [facility]wig Morton. Juanda Chance, MD  Electronically Signed by Jennye Moccasin PA-C on 03/01/2011 11:27:48 AM Electronically Signed by Stan Head MDFACG on 03/11/2011 10:03:49 AM

## 2011-03-11 NOTE — Telephone Encounter (Signed)
Patient's wife had some questions about Gastroparesis diet.  All questions answered.  She is asked to call back for any further questions.

## 2011-03-12 ENCOUNTER — Encounter: Payer: Self-pay | Admitting: Internal Medicine

## 2011-03-12 ENCOUNTER — Ambulatory Visit (AMBULATORY_SURGERY_CENTER): Payer: Medicare Other | Admitting: Internal Medicine

## 2011-03-12 VITALS — BP 104/56 | HR 80 | Temp 97.3°F | Resp 24 | Ht 70.0 in | Wt 157.0 lb

## 2011-03-12 DIAGNOSIS — K3184 Gastroparesis: Secondary | ICD-10-CM

## 2011-03-12 DIAGNOSIS — Z87898 Personal history of other specified conditions: Secondary | ICD-10-CM

## 2011-03-12 DIAGNOSIS — I4891 Unspecified atrial fibrillation: Secondary | ICD-10-CM

## 2011-03-12 DIAGNOSIS — K228 Other specified diseases of esophagus: Secondary | ICD-10-CM

## 2011-03-12 DIAGNOSIS — K253 Acute gastric ulcer without hemorrhage or perforation: Secondary | ICD-10-CM

## 2011-03-12 DIAGNOSIS — R911 Solitary pulmonary nodule: Secondary | ICD-10-CM

## 2011-03-12 DIAGNOSIS — K2210 Ulcer of esophagus without bleeding: Secondary | ICD-10-CM

## 2011-03-12 HISTORY — PX: UPPER GASTROINTESTINAL ENDOSCOPY: SHX188

## 2011-03-12 MED ORDER — METOCLOPRAMIDE HCL 5 MG PO TABS: ORAL_TABLET | ORAL | Status: DC

## 2011-03-12 MED ORDER — SODIUM CHLORIDE 0.9 % IV SOLN: 500.0000 mL | INTRAVENOUS | Status: DC

## 2011-03-12 MED ORDER — TAMSULOSIN HCL 0.4 MG PO CAPS: 0.4000 mg | ORAL_CAPSULE | ORAL | Status: DC

## 2011-03-12 MED ORDER — RIVAROXABAN 20 MG PO TABS: 1.0000 | ORAL_TABLET | Freq: Every day | ORAL | Status: DC

## 2011-03-12 NOTE — Assessment & Plan Note (Signed)
Dr. Daleen Squibb started Xarelto today.

## 2011-03-12 NOTE — Assessment & Plan Note (Signed)
Change Enablex to Flomax

## 2011-03-12 NOTE — Progress Notes (Signed)
Dr Algis Greenhouse cardiologist at pts bedside. Examined pt and checked BP 110/60 with heart rate 60.  pts iv started and Dr Daleen Squibb asked to leave open to gravity. He requested that pt receive a liter to a liter and a half while in the center today due to dehydartion and pt feeling poorly. Fluids wide open as requested. Wife and Dr Daleen Squibb at pts bedside. EWM

## 2011-03-12 NOTE — Patient Instructions (Addendum)
Your medications have changed as reflected in the list. The Enablex was stopped and you should now take Flomax (tamsulsosin). Metaclopramide 5 mg before meals is started to help the stomach empty. Keep the fluids going (Pedialyte) and also try to increase diet to types of foods on step 2 gastroparesis diet that you have. Xarelto is the new blood thinner started by Dr. Daleen Squibb. I will call to check early next week. Iva Boop, MD, [facility]

## 2011-03-15 ENCOUNTER — Inpatient Hospital Stay (HOSPITAL_COMMUNITY): Payer: Medicare Other

## 2011-03-15 ENCOUNTER — Telehealth: Payer: Self-pay | Admitting: *Deleted

## 2011-03-15 ENCOUNTER — Inpatient Hospital Stay (HOSPITAL_COMMUNITY)
Admission: AD | Admit: 2011-03-15 | Discharge: 2011-03-25 | DRG: 389 | Disposition: A | Discharge status: Skilled Nursing Facility | Payer: Medicare Other | Source: Ambulatory Visit | Attending: Internal Medicine | Admitting: Internal Medicine

## 2011-03-15 DIAGNOSIS — R059 Cough, unspecified: Secondary | ICD-10-CM | POA: Diagnosis not present

## 2011-03-15 DIAGNOSIS — M1990 Unspecified osteoarthritis, unspecified site: Secondary | ICD-10-CM | POA: Diagnosis present

## 2011-03-15 DIAGNOSIS — R627 Adult failure to thrive: Secondary | ICD-10-CM | POA: Diagnosis present

## 2011-03-15 DIAGNOSIS — Z951 Presence of aortocoronary bypass graft: Secondary | ICD-10-CM

## 2011-03-15 DIAGNOSIS — I441 Atrioventricular block, second degree: Secondary | ICD-10-CM | POA: Diagnosis present

## 2011-03-15 DIAGNOSIS — R5381 Other malaise: Secondary | ICD-10-CM | POA: Diagnosis not present

## 2011-03-15 DIAGNOSIS — Z87891 Personal history of nicotine dependence: Secondary | ICD-10-CM

## 2011-03-15 DIAGNOSIS — I714 Abdominal aortic aneurysm, without rupture, unspecified: Secondary | ICD-10-CM | POA: Diagnosis present

## 2011-03-15 DIAGNOSIS — Z950 Presence of cardiac pacemaker: Secondary | ICD-10-CM

## 2011-03-15 DIAGNOSIS — Z885 Allergy status to narcotic agent status: Secondary | ICD-10-CM

## 2011-03-15 DIAGNOSIS — D649 Anemia, unspecified: Secondary | ICD-10-CM | POA: Diagnosis present

## 2011-03-15 DIAGNOSIS — I10 Essential (primary) hypertension: Secondary | ICD-10-CM | POA: Diagnosis present

## 2011-03-15 DIAGNOSIS — E860 Dehydration: Secondary | ICD-10-CM | POA: Diagnosis present

## 2011-03-15 DIAGNOSIS — Z9884 Bariatric surgery status: Secondary | ICD-10-CM

## 2011-03-15 DIAGNOSIS — I4891 Unspecified atrial fibrillation: Secondary | ICD-10-CM | POA: Diagnosis present

## 2011-03-15 DIAGNOSIS — K289 Gastrojejunal ulcer, unspecified as acute or chronic, without hemorrhage or perforation: Secondary | ICD-10-CM | POA: Diagnosis present

## 2011-03-15 DIAGNOSIS — K56609 Unspecified intestinal obstruction, unspecified as to partial versus complete obstruction: Principal | ICD-10-CM | POA: Diagnosis present

## 2011-03-15 DIAGNOSIS — F4321 Adjustment disorder with depressed mood: Secondary | ICD-10-CM | POA: Diagnosis present

## 2011-03-15 DIAGNOSIS — E46 Unspecified protein-calorie malnutrition: Secondary | ICD-10-CM | POA: Diagnosis present

## 2011-03-15 DIAGNOSIS — K3184 Gastroparesis: Secondary | ICD-10-CM | POA: Diagnosis present

## 2011-03-15 DIAGNOSIS — G4733 Obstructive sleep apnea (adult) (pediatric): Secondary | ICD-10-CM | POA: Diagnosis present

## 2011-03-15 DIAGNOSIS — R1013 Epigastric pain: Secondary | ICD-10-CM

## 2011-03-15 DIAGNOSIS — E785 Hyperlipidemia, unspecified: Secondary | ICD-10-CM | POA: Diagnosis present

## 2011-03-15 DIAGNOSIS — T457X5A Adverse effect of anticoagulant antagonists, vitamin K and other coagulants, initial encounter: Secondary | ICD-10-CM | POA: Diagnosis present

## 2011-03-15 DIAGNOSIS — Z8673 Personal history of transient ischemic attack (TIA), and cerebral infarction without residual deficits: Secondary | ICD-10-CM

## 2011-03-15 DIAGNOSIS — R05 Cough: Secondary | ICD-10-CM | POA: Diagnosis not present

## 2011-03-15 DIAGNOSIS — Z880 Allergy status to penicillin: Secondary | ICD-10-CM

## 2011-03-15 DIAGNOSIS — Z7901 Long term (current) use of anticoagulants: Secondary | ICD-10-CM

## 2011-03-15 DIAGNOSIS — Z8711 Personal history of peptic ulcer disease: Secondary | ICD-10-CM

## 2011-03-15 DIAGNOSIS — I2510 Atherosclerotic heart disease of native coronary artery without angina pectoris: Secondary | ICD-10-CM | POA: Diagnosis present

## 2011-03-15 LAB — COMPREHENSIVE METABOLIC PANEL
AST: 20 U/L (ref 0–37)
Albumin: 2.6 g/dL — ABNORMAL LOW (ref 3.5–5.2)
Alkaline Phosphatase: 88 U/L (ref 39–117)
Chloride: 100 mEq/L (ref 96–112)
Potassium: 4.7 mEq/L (ref 3.5–5.1)
Total Bilirubin: 0.6 mg/dL (ref 0.3–1.2)
Total Protein: 6.3 g/dL (ref 6.0–8.3)

## 2011-03-15 LAB — DIFFERENTIAL
Basophils Absolute: 0 10*3/uL (ref 0.0–0.1)
Basophils Relative: 0 % (ref 0–1)
Eosinophils Absolute: 0 10*3/uL (ref 0.0–0.7)
Eosinophils Relative: 0 % (ref 0–5)
Monocytes Absolute: 1.2 10*3/uL — ABNORMAL HIGH (ref 0.1–1.0)

## 2011-03-15 LAB — PROTIME-INR: Prothrombin Time: 22.8 seconds — ABNORMAL HIGH (ref 11.6–15.2)

## 2011-03-15 LAB — PREALBUMIN: Prealbumin: 11.1 mg/dL — ABNORMAL LOW (ref 17.0–34.0)

## 2011-03-15 LAB — CBC
MCHC: 32.5 g/dL (ref 30.0–36.0)
Platelets: 220 10*3/uL (ref 150–400)
RDW: 14.2 % (ref 11.5–15.5)
WBC: 9.7 10*3/uL (ref 4.0–10.5)

## 2011-03-15 LAB — LIPASE, BLOOD: Lipase: 74 U/L — ABNORMAL HIGH (ref 11–59)

## 2011-03-15 NOTE — Telephone Encounter (Signed)
Tried calling pt. Back to see if pt any better , no answer; therefore called Darcey Nora in Dr. Marvell Fuller office . Sheri said pt. Has been admitted to hospital .

## 2011-03-15 NOTE — Telephone Encounter (Signed)
On follow up call back , spoke with pt's wife,Eric [name], who said pt . had a terrible weekend . She said pt. Was nauseated& vomited  all weekend and she couldn't get him to keep liquids down, She said she did not call the Dr. Or anything ,she did not know what to do. Wife says pt . Feels a little better this morning and I told her I would get this message to Dr, Leone Payor, Pt. Is drinking pedialyte @ this time and I encouraged the wife to call us back if pt remains nauseated or vomits anymore.

## 2011-03-16 ENCOUNTER — Inpatient Hospital Stay (HOSPITAL_COMMUNITY): Payer: Medicare Other

## 2011-03-16 DIAGNOSIS — K56609 Unspecified intestinal obstruction, unspecified as to partial versus complete obstruction: Secondary | ICD-10-CM

## 2011-03-16 DIAGNOSIS — R1013 Epigastric pain: Secondary | ICD-10-CM

## 2011-03-16 DIAGNOSIS — R627 Adult failure to thrive: Secondary | ICD-10-CM

## 2011-03-16 LAB — DIFFERENTIAL
Basophils Absolute: 0 10*3/uL (ref 0.0–0.1)
Lymphocytes Relative: 14 % (ref 12–46)
Monocytes Absolute: 1.2 10*3/uL — ABNORMAL HIGH (ref 0.1–1.0)
Neutro Abs: 7.2 10*3/uL (ref 1.7–7.7)

## 2011-03-16 LAB — URINALYSIS, ROUTINE W REFLEX MICROSCOPIC
Glucose, UA: NEGATIVE mg/dL
Nitrite: NEGATIVE
Specific Gravity, Urine: 1.021 (ref 1.005–1.030)

## 2011-03-16 LAB — CBC
HCT: 27 % — ABNORMAL LOW (ref 39.0–52.0)
Hemoglobin: 8.9 g/dL — ABNORMAL LOW (ref 13.0–17.0)
RBC: 3.05 MIL/uL — ABNORMAL LOW (ref 4.22–5.81)
WBC: 9.9 10*3/uL (ref 4.0–10.5)

## 2011-03-16 LAB — BASIC METABOLIC PANEL
BUN: 26 mg/dL — ABNORMAL HIGH (ref 6–23)
CO2: 24 mEq/L (ref 19–32)
Chloride: 100 mEq/L (ref 96–112)
Glucose, Bld: 134 mg/dL — ABNORMAL HIGH (ref 70–99)
Potassium: 4.3 mEq/L (ref 3.5–5.1)

## 2011-03-16 LAB — URINE MICROSCOPIC-ADD ON

## 2011-03-17 ENCOUNTER — Inpatient Hospital Stay (HOSPITAL_COMMUNITY): Payer: Medicare Other

## 2011-03-17 DIAGNOSIS — D6489 Other specified anemias: Secondary | ICD-10-CM

## 2011-03-17 DIAGNOSIS — R627 Adult failure to thrive: Secondary | ICD-10-CM

## 2011-03-17 DIAGNOSIS — K56609 Unspecified intestinal obstruction, unspecified as to partial versus complete obstruction: Secondary | ICD-10-CM

## 2011-03-17 DIAGNOSIS — R1013 Epigastric pain: Secondary | ICD-10-CM

## 2011-03-17 LAB — BASIC METABOLIC PANEL
CO2: 25 mEq/L (ref 19–32)
Calcium: 8.7 mg/dL (ref 8.4–10.5)
Chloride: 101 mEq/L (ref 96–112)
Glucose, Bld: 140 mg/dL — ABNORMAL HIGH (ref 70–99)
Sodium: 131 mEq/L — ABNORMAL LOW (ref 135–145)

## 2011-03-17 LAB — CBC
HCT: 25.4 % — ABNORMAL LOW (ref 39.0–52.0)
Hemoglobin: 8.3 g/dL — ABNORMAL LOW (ref 13.0–17.0)
MCH: 29 pg (ref 26.0–34.0)
MCHC: 32.7 g/dL (ref 30.0–36.0)
RBC: 2.86 MIL/uL — ABNORMAL LOW (ref 4.22–5.81)

## 2011-03-17 LAB — DIFFERENTIAL
Basophils Absolute: 0 10*3/uL (ref 0.0–0.1)
Lymphocytes Relative: 15 % (ref 12–46)
Monocytes Absolute: 1.2 10*3/uL — ABNORMAL HIGH (ref 0.1–1.0)
Monocytes Relative: 13 % — ABNORMAL HIGH (ref 3–12)
Neutro Abs: 6 10*3/uL (ref 1.7–7.7)

## 2011-03-17 LAB — HEPARIN LEVEL (UNFRACTIONATED): Heparin Unfractionated: 0.31 IU/mL (ref 0.30–0.70)

## 2011-03-18 ENCOUNTER — Inpatient Hospital Stay (HOSPITAL_COMMUNITY): Payer: Medicare Other

## 2011-03-18 LAB — COMPREHENSIVE METABOLIC PANEL
Alkaline Phosphatase: 85 U/L (ref 39–117)
BUN: 21 mg/dL (ref 6–23)
CO2: 24 mEq/L (ref 19–32)
Calcium: 8.6 mg/dL (ref 8.4–10.5)
GFR calc Af Amer: >90 mL/min (ref >90)
GFR calc non Af Amer: 84 mL/min — ABNORMAL LOW (ref >90)
Glucose, Bld: 162 mg/dL — ABNORMAL HIGH (ref 70–99)
Total Protein: 5.5 g/dL — ABNORMAL LOW (ref 6.0–8.3)

## 2011-03-18 LAB — CHOLESTEROL, TOTAL: Cholesterol: 95 mg/dL (ref 0–200)

## 2011-03-18 LAB — MAGNESIUM: Magnesium: 2.1 mg/dL (ref 1.5–2.5)

## 2011-03-18 LAB — DIFFERENTIAL
Basophils Relative: 0 % (ref 0–1)
Eosinophils Absolute: 0.2 10*3/uL (ref 0.0–0.7)
Eosinophils Relative: 2 % (ref 0–5)
Monocytes Absolute: 0.9 10*3/uL (ref 0.1–1.0)
Monocytes Relative: 10 % (ref 3–12)

## 2011-03-18 LAB — HEPARIN LEVEL (UNFRACTIONATED)
Heparin Unfractionated: 0.16 IU/mL — ABNORMAL LOW (ref 0.30–0.70)
Heparin Unfractionated: 0.5 IU/mL (ref 0.30–0.70)

## 2011-03-18 LAB — CBC
MCH: 29.3 pg (ref 26.0–34.0)
MCHC: 33 g/dL (ref 30.0–36.0)
MCV: 88.8 fL (ref 78.0–100.0)
Platelets: 219 10*3/uL (ref 150–400)

## 2011-03-18 LAB — GLUCOSE, CAPILLARY: Glucose-Capillary: 140 mg/dL — ABNORMAL HIGH (ref 70–99)

## 2011-03-18 LAB — TRIGLYCERIDES: Triglycerides: 51 mg/dL (ref <150)

## 2011-03-19 ENCOUNTER — Inpatient Hospital Stay (HOSPITAL_COMMUNITY): Payer: Medicare Other

## 2011-03-19 ENCOUNTER — Encounter: Payer: Medicare Other | Admitting: Internal Medicine

## 2011-03-19 LAB — BASIC METABOLIC PANEL
BUN: 22 mg/dL (ref 6–23)
CO2: 24 mEq/L (ref 19–32)
Chloride: 97 mEq/L (ref 96–112)
Creatinine, Ser: 0.61 mg/dL (ref 0.50–1.35)
Glucose, Bld: 154 mg/dL — ABNORMAL HIGH (ref 70–99)

## 2011-03-19 LAB — DIFFERENTIAL
Eosinophils Absolute: 0.2 10*3/uL (ref 0.0–0.7)
Lymphs Abs: 1.2 10*3/uL (ref 0.7–4.0)
Monocytes Relative: 11 % (ref 3–12)
Neutro Abs: 7.2 10*3/uL (ref 1.7–7.7)
Neutrophils Relative %: 75 % (ref 43–77)

## 2011-03-19 LAB — CBC
Hemoglobin: 8 g/dL — ABNORMAL LOW (ref 13.0–17.0)
MCH: 29.1 pg (ref 26.0–34.0)
MCV: 88 fL (ref 78.0–100.0)
RBC: 2.75 MIL/uL — ABNORMAL LOW (ref 4.22–5.81)

## 2011-03-20 ENCOUNTER — Inpatient Hospital Stay (HOSPITAL_COMMUNITY): Payer: Medicare Other

## 2011-03-20 DIAGNOSIS — R627 Adult failure to thrive: Secondary | ICD-10-CM

## 2011-03-20 DIAGNOSIS — K56609 Unspecified intestinal obstruction, unspecified as to partial versus complete obstruction: Secondary | ICD-10-CM

## 2011-03-20 DIAGNOSIS — R1013 Epigastric pain: Secondary | ICD-10-CM

## 2011-03-20 LAB — CROSSMATCH: Unit division: 0

## 2011-03-20 LAB — CBC
MCH: 29.1 pg (ref 26.0–34.0)
MCV: 87.7 fL (ref 78.0–100.0)
Platelets: 190 10*3/uL (ref 150–400)
RDW: 14 % (ref 11.5–15.5)

## 2011-03-21 DIAGNOSIS — J180 Bronchopneumonia, unspecified organism: Secondary | ICD-10-CM

## 2011-03-21 DIAGNOSIS — D6489 Other specified anemias: Secondary | ICD-10-CM

## 2011-03-21 LAB — CBC
MCV: 87.3 fL (ref 78.0–100.0)
Platelets: 201 10*3/uL (ref 150–400)
RBC: 3.22 MIL/uL — ABNORMAL LOW (ref 4.22–5.81)
WBC: 8.4 10*3/uL (ref 4.0–10.5)

## 2011-03-21 LAB — BASIC METABOLIC PANEL
CO2: 24 mEq/L (ref 19–32)
Chloride: 101 mEq/L (ref 96–112)
Creatinine, Ser: 0.77 mg/dL (ref 0.50–1.35)
Potassium: 4.1 mEq/L (ref 3.5–5.1)

## 2011-03-21 LAB — PHOSPHORUS: Phosphorus: 3.6 mg/dL (ref 2.3–4.6)

## 2011-03-21 LAB — MAGNESIUM: Magnesium: 2 mg/dL (ref 1.5–2.5)

## 2011-03-21 LAB — HEPARIN LEVEL (UNFRACTIONATED): Heparin Unfractionated: <0.1 IU/mL — ABNORMAL LOW (ref 0.30–0.70)

## 2011-03-22 ENCOUNTER — Inpatient Hospital Stay (HOSPITAL_COMMUNITY): Payer: Medicare Other

## 2011-03-22 DIAGNOSIS — R627 Adult failure to thrive: Secondary | ICD-10-CM

## 2011-03-22 DIAGNOSIS — K56609 Unspecified intestinal obstruction, unspecified as to partial versus complete obstruction: Secondary | ICD-10-CM

## 2011-03-22 DIAGNOSIS — K5669 Other intestinal obstruction: Secondary | ICD-10-CM

## 2011-03-22 LAB — COMPREHENSIVE METABOLIC PANEL
Albumin: 2 g/dL — ABNORMAL LOW (ref 3.5–5.2)
BUN: 25 mg/dL — ABNORMAL HIGH (ref 6–23)
Calcium: 8.7 mg/dL (ref 8.4–10.5)
Creatinine, Ser: 0.73 mg/dL (ref 0.50–1.35)
Total Protein: 5.4 g/dL — ABNORMAL LOW (ref 6.0–8.3)

## 2011-03-22 LAB — GLUCOSE, CAPILLARY
Glucose-Capillary: 133 mg/dL — ABNORMAL HIGH (ref 70–99)
Glucose-Capillary: 138 mg/dL — ABNORMAL HIGH (ref 70–99)
Glucose-Capillary: 139 mg/dL — ABNORMAL HIGH (ref 70–99)
Glucose-Capillary: 142 mg/dL — ABNORMAL HIGH (ref 70–99)
Glucose-Capillary: 145 mg/dL — ABNORMAL HIGH (ref 70–99)
Glucose-Capillary: 155 mg/dL — ABNORMAL HIGH (ref 70–99)
Glucose-Capillary: 168 mg/dL — ABNORMAL HIGH (ref 70–99)

## 2011-03-22 LAB — DIFFERENTIAL
Eosinophils Relative: 1 % (ref 0–5)
Lymphocytes Relative: 10 % — ABNORMAL LOW (ref 12–46)
Lymphs Abs: 1.3 10*3/uL (ref 0.7–4.0)
Monocytes Absolute: 1.5 10*3/uL — ABNORMAL HIGH (ref 0.1–1.0)

## 2011-03-22 LAB — HEPARIN LEVEL (UNFRACTIONATED): Heparin Unfractionated: 1.17 IU/mL — ABNORMAL HIGH (ref 0.30–0.70)

## 2011-03-22 LAB — CBC
HCT: 27.9 % — ABNORMAL LOW (ref 39.0–52.0)
MCHC: 34.1 g/dL (ref 30.0–36.0)
MCV: 87.5 fL (ref 78.0–100.0)
RDW: 14.3 % (ref 11.5–15.5)

## 2011-03-22 LAB — MAGNESIUM: Magnesium: 2 mg/dL (ref 1.5–2.5)

## 2011-03-22 LAB — PHOSPHORUS: Phosphorus: 3.6 mg/dL (ref 2.3–4.6)

## 2011-03-23 DIAGNOSIS — D6489 Other specified anemias: Secondary | ICD-10-CM

## 2011-03-23 DIAGNOSIS — K56609 Unspecified intestinal obstruction, unspecified as to partial versus complete obstruction: Secondary | ICD-10-CM

## 2011-03-23 DIAGNOSIS — R627 Adult failure to thrive: Secondary | ICD-10-CM

## 2011-03-23 LAB — GLUCOSE, CAPILLARY: Glucose-Capillary: 145 mg/dL — ABNORMAL HIGH (ref 70–99)

## 2011-03-23 LAB — DIFFERENTIAL
Basophils Relative: 0 % (ref 0–1)
Monocytes Relative: 9 % (ref 3–12)
Neutro Abs: 14.1 10*3/uL — ABNORMAL HIGH (ref 1.7–7.7)
Neutrophils Relative %: 82 % — ABNORMAL HIGH (ref 43–77)

## 2011-03-23 LAB — CBC
Hemoglobin: 9 g/dL — ABNORMAL LOW (ref 13.0–17.0)
MCH: 29.4 pg (ref 26.0–34.0)
RBC: 3.06 MIL/uL — ABNORMAL LOW (ref 4.22–5.81)

## 2011-03-24 ENCOUNTER — Inpatient Hospital Stay (HOSPITAL_COMMUNITY): Payer: Medicare Other

## 2011-03-24 DIAGNOSIS — R1013 Epigastric pain: Secondary | ICD-10-CM

## 2011-03-24 LAB — GLUCOSE, CAPILLARY: Glucose-Capillary: 167 mg/dL — ABNORMAL HIGH (ref 70–99)

## 2011-03-24 LAB — COMPREHENSIVE METABOLIC PANEL
ALT: 35 U/L (ref 0–53)
AST: 14 U/L (ref 0–37)
Alkaline Phosphatase: 108 U/L (ref 39–117)
CO2: 22 mEq/L (ref 19–32)
Chloride: 97 mEq/L (ref 96–112)
GFR calc non Af Amer: 74 mL/min — ABNORMAL LOW (ref >90)
Potassium: 4.8 mEq/L (ref 3.5–5.1)
Sodium: 126 mEq/L — ABNORMAL LOW (ref 135–145)
Total Bilirubin: 0.3 mg/dL (ref 0.3–1.2)

## 2011-03-24 LAB — CBC
MCV: 87.5 fL (ref 78.0–100.0)
Platelets: 177 10*3/uL (ref 150–400)
RBC: 2.97 MIL/uL — ABNORMAL LOW (ref 4.22–5.81)
WBC: 13.1 10*3/uL — ABNORMAL HIGH (ref 4.0–10.5)

## 2011-03-25 DIAGNOSIS — D6489 Other specified anemias: Secondary | ICD-10-CM

## 2011-03-25 DIAGNOSIS — R627 Adult failure to thrive: Secondary | ICD-10-CM

## 2011-03-25 DIAGNOSIS — R1013 Epigastric pain: Secondary | ICD-10-CM

## 2011-03-25 DIAGNOSIS — K56609 Unspecified intestinal obstruction, unspecified as to partial versus complete obstruction: Secondary | ICD-10-CM

## 2011-03-25 LAB — COMPREHENSIVE METABOLIC PANEL
Albumin: 2.1 g/dL — ABNORMAL LOW (ref 3.5–5.2)
BUN: 44 mg/dL — ABNORMAL HIGH (ref 6–23)
Creatinine, Ser: 0.99 mg/dL (ref 0.50–1.35)
GFR calc Af Amer: 85 mL/min — ABNORMAL LOW (ref >90)
Glucose, Bld: 137 mg/dL — ABNORMAL HIGH (ref 70–99)
Total Bilirubin: 0.3 mg/dL (ref 0.3–1.2)
Total Protein: 5.4 g/dL — ABNORMAL LOW (ref 6.0–8.3)

## 2011-03-25 LAB — CBC
HCT: 26.2 % — ABNORMAL LOW (ref 39.0–52.0)
Hemoglobin: 8.7 g/dL — ABNORMAL LOW (ref 13.0–17.0)
MCHC: 33.2 g/dL (ref 30.0–36.0)
MCV: 87.3 fL (ref 78.0–100.0)
RDW: 14.7 % (ref 11.5–15.5)

## 2011-03-25 LAB — PHOSPHORUS: Phosphorus: 4.9 mg/dL — ABNORMAL HIGH (ref 2.3–4.6)

## 2011-03-25 LAB — MAGNESIUM: Magnesium: 2.2 mg/dL (ref 1.5–2.5)

## 2011-03-29 ENCOUNTER — Telehealth: Payer: Self-pay | Admitting: Internal Medicine

## 2011-03-29 NOTE — Telephone Encounter (Signed)
I spoke to Eric [name] also.  He is not vomiting, no abdominal distention and having bowel movements.  Please call and order a low residue heart healthy diet. Phone # is [phone] if you do not have it.  I want to see Eric [name] in the office next week.  Plan is to stay on TPN until we know he is eating well and not going to go NPO again (7-10 days of tolerating po)  When you are done please copy Dr. Daleen Squibb on the communication

## 2011-03-29 NOTE — Telephone Encounter (Signed)
Orders called to Mayra Neer at [facility].  REV scheduled for 04/07/11 11:30.  Patient's wife is aware.

## 2011-03-29 NOTE — Telephone Encounter (Signed)
Patient was discharged to [facility] (rehab center).  He has been on a full liquid diet since discharge and wants to try and progress his diet.  Is it ok to advance his diet?

## 2011-03-31 ENCOUNTER — Ambulatory Visit: Payer: Medicare Other | Admitting: Internal Medicine

## 2011-04-05 NOTE — Consult Note (Signed)
[name], [name] NO.:  [id number]  MEDICAL RECORD NO.:  [id number]  LOCATION:  1412                         FACILITY:  [facility]  PHYSICIAN:  Anselm Pancoast. Hazleigh Mccleave, M.D.DATE OF BIRTH:  19-Oct-1925  DATE OF CONSULTATION:  03/17/2011 DATE OF DISCHARGE:                                CONSULTATION   TIME OF CONSULTATION:  1430 p.m.  REQUESTING PHYSICIAN:  Erick Blinks, MD.  PRIMARY GASTROENTEROLOGIST:  Iva Boop, MD,FACG.  CONSULTING SURGEON:  Anselm Pancoast. Zachery Dakins, M.D.  REASON FOR CONSULTATION:  Small-bowel obstruction.  HISTORY OF PRESENT ILLNESS:  Eric [name] is an 77 year old white male with a complex past medical history as well as the complex recent GI history. Many years ago, the patient had severe peptic ulcer disease, and had a Billroth-I procedure with a gastrojejunostomy hook up.  The patient has done well from this ever since.  He has had several episodes of bowel obstructions in the early Oct 24, 1998 which all resolved on their own.  Here recently within the last several weeks to months, the patient has had difficulty maintaining his weight.  He has lost approximately 20 pounds. He has had issues with nausea and vomiting as well as abdominal pain. He was admitted to [facility] recently where he had an upper endoscopy, which revealed several anastomotic ulcers at his GJ junction.  The patient was placed on medications to help with this.  He was also found to have some gastroparesis with food particles still in his stomach as well.  After treatment for this last Friday, he had a repeat endoscopy, which revealed only 1 small minimal ulcer left to his anastomosis with significant improvement otherwise.  Over the weekend on Saturday and 2022-10-24, the patient began developing abdominal bloating.  He tried to eat several different things including milk shakes or chicken and rice soup; however, had worsening pain as well as nausea and vomiting.  His wife had him call  the GI doctors back and the patient was informed to come to [facility]  The patient did have a bowel movement on 10-24-2022. He has passed very minimal flatus since then.  He did have 1 bowel movement today despite the fact that his x-rays continue to show a fairly high-grade small-bowel obstruction.  For this reason, we have been asked to evaluate the patient for further recommendations.  REVIEW OF SYSTEMS:  Please see HPI.  Otherwise, currently all other systems have been reviewed and are negative.  FAMILY HISTORY:  Noncontributory to current problem.  PAST MEDICAL HISTORY:  Per e-chart, 1. Coronary artery disease. 2. Second-degree AV block. 3. BPH. 4. Hyperlipidemia. 5. History of TIA. 6. History of AAA. 7. History of small-bowel obstructions. 8. Atrial fibrillation. 9. OSA. 10.Hypertension.  PAST SURGICAL HISTORY: 1. Billroth-I gastrectomy with incidental appendectomy. 2. Right knee scope. 3. Coronary artery bypass grafting x2 with a 3vessel and a 6-vessel     bypass.  SOCIAL HISTORY:  The patient is married.  They have no children.  He quit smoking many years ago.  He denies any alcohol or illicit drugs. He currently still works in a Nurse, learning disability.  ALLERGIES: 1. PHENERGAN. 2. MORPHINE. 3. PENICILLIN.  HOME MEDICATIONS:  1. Xarelto 20 mg at bedtime. 2. Colace. 3. Reglan 5 mg t.i.d. 4. Tramadol 50 mg q.6 hours p.r.n. 5. Tamsulosin 0.4 mg daily. 6. Multivitamin. 7. Protonix 40 mg daily. 8. MiraLAX 17 mg daily. 9. Finasteride 5 mg at bedtime. 10.Aspirin 81 mg daily. 11.Accupril 20 mg daily.  PHYSICAL EXAMINATION:  GENERAL:  Eric [name] is a very pleasant 77 year old white male who is currently sitting up in bed, in no acute distress. VITAL SIGNS:  Temperature 97.4, pulse 71, respirations 20, blood pressure 128/71. HEENT: Head is normocephalic, atraumatic.  Sclerae noninjected.  Pupils are equal, round, and reactive to light.  Ears and nose without  any obvious masses or lesions.  No rhinorrhea.  Mouth is pink.  Throat shows no exudate.  The patient does have an NG tube in place with bilious output. HEART:  Currently appears to be regular rate and rhythm.  He has a normal S1, S2 with no obvious murmurs, gallops, rubs noted.  He does have palpable carotid, radial, and pedal pulses bilaterally. LUNGS:  Clear to auscultation bilaterally with no wheezes, rhonchi, or rales noted.  Respiratory effort is nonlabored. ABDOMEN:  Soft and nontender.  He has mild bloating and distention.  He has hypoactive bowel sounds.  He has an upper midline incisional scar. He does have an NG tube with bilious output.  Otherwise, no masses, hernias, or organomegaly are noted.  MUSCULOSKELETAL:  All 4 extremities are symmetrical.  No cyanosis, clubbing, or edema. SKIN:  Warm and dry with no masses, lesions, or rashes. PSYCH:  The patient is alert and oriented x3 with an appropriate affect.  LABORATORIES:  Sodium 131, potassium 4.4, glucose 140, BUN 21, creatinine 0.79.  White blood cell count 8600, hemoglobin 8.3, hematocrit 25.4, platelet count is 220,000.  DIAGNOSTICS:  Most recent abdominal x-ray today revealed small bowel obstruction with no free air.  IMPRESSION:  Small bowel obstruction likely secondary to adhesive disease.  PLAN:  We agree with continuing NG tube management with bowel rest.  We will repeat abdominal films in the morning and follow his bowel obstruction pattern.  If the patient does not improve with conservative management, he may need more definitive management with surgical exploration.  We will follow the patient along with you.  Thank you for this consultation.     Letha Cape, PA   ______________________________ Anselm Pancoast. Zachery Dakins, M.D.    KEO/MEDQ  D:  03/17/2011  T:  03/17/2011  Job:  [id number]  cc:   Erick Blinks, MD Fax: [phone]  Iva Boop, MD,FACG [facility] [address] [place],  [place] [address]  Electronically Signed by Barnetta Chapel PA on 03/25/2011 05:40:58 PM Electronically Signed by Consuello Bossier M.D. on 04/05/2011 11:24:52 AM

## 2011-04-07 ENCOUNTER — Ambulatory Visit (INDEPENDENT_AMBULATORY_CARE_PROVIDER_SITE_OTHER): Payer: Medicare Other | Admitting: Internal Medicine

## 2011-04-07 ENCOUNTER — Encounter: Payer: Self-pay | Admitting: Internal Medicine

## 2011-04-07 DIAGNOSIS — E46 Unspecified protein-calorie malnutrition: Secondary | ICD-10-CM

## 2011-04-07 DIAGNOSIS — K56609 Unspecified intestinal obstruction, unspecified as to partial versus complete obstruction: Secondary | ICD-10-CM | POA: Insufficient documentation

## 2011-04-07 DIAGNOSIS — K253 Acute gastric ulcer without hemorrhage or perforation: Secondary | ICD-10-CM

## 2011-04-07 DIAGNOSIS — K3184 Gastroparesis: Secondary | ICD-10-CM

## 2011-04-07 DIAGNOSIS — R609 Edema, unspecified: Secondary | ICD-10-CM

## 2011-04-07 DIAGNOSIS — R600 Localized edema: Secondary | ICD-10-CM

## 2011-04-07 DIAGNOSIS — D62 Acute posthemorrhagic anemia: Secondary | ICD-10-CM

## 2011-04-07 MED ORDER — FERROUS SULFATE 325 (65 FE) MG PO TABS: 325.0000 mg | ORAL_TABLET | Freq: Every day | ORAL | Status: DC

## 2011-04-07 MED ORDER — POLYETHYLENE GLYCOL 3350 17 GM/SCOOP PO POWD: 17.0000 g | Freq: Every day | ORAL | Status: AC

## 2011-04-07 NOTE — Patient Instructions (Signed)
Start Miralax daily as discussed and as on medication list, If you develop diarrhea hold medication and give [place] a call. Follow up as needed. As physical therapy and Dr. Daleen Squibb if it's ok for you to return to cardiac rehab. Gradually add cooked and finely chopped roughage back into your diet. Start Ferrous Sulfate (Iron) supplement you can get this over the counter. This may turn your stools dark and cause constipation just call back and let us know how this is working.

## 2011-04-07 NOTE — Assessment & Plan Note (Addendum)
Clearly better. Exact cause unclear but suspect component of anticholinergic effects of Enablex, gastric ulcers and prior vagotomy.

## 2011-04-07 NOTE — Assessment & Plan Note (Addendum)
Appears resolved. Advance diet some but roughage needs to be chopped or cooked very well. Reviewed.

## 2011-04-07 NOTE — Assessment & Plan Note (Signed)
Resume ferrous sulfate. Recheck CBC and would do a ferritin as well when he sees Dr. Daleen Squibb in one month.

## 2011-04-07 NOTE — Assessment & Plan Note (Addendum)
Not totally resolved but significantly improved. Since he is tolerating a diet well continue TPN and I removed his PICC line today. Albumin is 2.3 which is improved. I explained that things should improve over time with continued oral intake. His BUN is 43 I think that's more from some catabolism and TPN infusion rather than dehydration. Suspect pedal edema is related to low albumin and should resolve over time He should have another metabolic panel when he sees Dr. Daleen Squibb in about a month.

## 2011-04-07 NOTE — Assessment & Plan Note (Signed)
better 

## 2011-04-07 NOTE — Progress Notes (Signed)
  Subjective:    Patient ID: Eric [name], male    DOB: [birth year], 77 y.o.   MRN: [id number]  HPI elderly white man here with wife and niece, recent complicated history. He had been admitted with gastric ulcers and a gastric outlet obstruction that was probably partially obstructed and functional. He had been using nonsteroidals as well as Pradaxa. Both of those were stopped and he eventually improved but then subsequently developed a small bowel obstruction. He was treated conservatively improved. He was placed on TPN and has been on that for several weeks. He went to a rehabilitation facility close to home. He is eating without difficulty - low residue diet. No nausea or vomiting. Bowel movements about every other day. Not back on MiraLax. Not on metaclopramide. Appetite improved. Mood better. Home from rehab today.About 65 % of strength he thinks.  Past Medical History  Diagnosis Date  . BPH (benign prostatic hypertrophy)   . DJD (degenerative joint disease)   . Hx of transient ischemic attack (TIA)   . AAA (abdominal aortic aneurysm)   . Cerebrovascular disease   . HLD (hyperlipidemia)   . HTN (hypertension)   . Second degree AV block, Mobitz type II     s/p PPM  . CAD (coronary artery disease)   . Persistent atrial fibrillation     on pradaxa  . Sleep apnea   . Gastric ulcer, acute 02/2011    multiple - NSAID/Pradaxa thought causative. associated gastroparesis-like problems  . Small bowel obstruction     multiple over the years   Past Surgical History  Procedure Date  . Pacemaker insertion 01/24/10    Adapta Med[employer]   . Coronary artery bypass graft   . Appendectomy   . Gastrectomy   . Knee arthroscopy     right, with medial and lateral meniscal  . Finger surgery      Left middle finger dorsal lesion  . Upper gastrointestinal endoscopy 03/12/2011    stomach ulcer, esophageal erosion    reports that he quit smoking about 42 years ago. He has never used smokeless tobacco.  He reports that he does not drink alcohol or use illicit drugs. family history includes Colon cancer in his brother and Coronary artery disease in his father. Allergies  Allergen Reactions  . Morphine   . Penicillins   . Phenergan     CHANGE IN BEHAVIOR        Review of Systems Mouth not dry off Enablex, urinates every 2 hours + pedal edema     Objective:   Physical Exam Elderly white man no acute distress Lungs are clear Heart normal rate regular rhythm Abdomen is soft without splash, bowel sounds are present it is nontender Lower extremities show ankle and pedal edema 1+ maximum and bilateral Mood and affect are appropriate Using a cane to ambulate        Assessment & Plan:

## 2011-04-08 ENCOUNTER — Encounter: Payer: Medicare Other | Admitting: Internal Medicine

## 2011-04-12 NOTE — Discharge Summary (Signed)
NAMETHEOPLIS, GARCIAGARCIA NO.:  [id number]  MEDICAL RECORD NO.:  [id number]  LOCATION:  1412                         FACILITY:  [facility]  PHYSICIAN:  Hedwig Morton. Juanda Chance, MD     DATE OF BIRTH:  [birth year]  DATE OF ADMISSION:  03/15/2011 DATE OF DISCHARGE:  03/25/2011                        DISCHARGE SUMMARY - REFERRING   DISPOSITION:  Transfer to [facility] in stable condition.  DISCHARGE MEDICATIONS: 1. Colace 100 mg by mouth daily. 2. Finasteride 5 mg 1 tablet daily at bedtime. 3. Multiple vitamin 1 tablet daily every morning. 4. Protonix 40 mg by mouth twice daily. 5. Flomax 0.4 mg 1 capsule by mouth daily. 6. Ultram 50 mg 1 tablet every 6 hours as needed. 7. Xarelto 20 mg 1 tablet daily at bedtime. 8. Norvasc 5 mg by mouth daily. 9. Effexor 75 mg 1 tablet by mouth daily. 10.Accupril 20 mg 1 tablet by mouth daily. 11.Zofran 4 mg 1 tablet every 6 hours as needed for nausea. 12.Enablex 50 mg 1 tablet daily at bedtime. 13.NovoLog Pen 100 units per mL to be given based on sliding scale     (refer to patient's medication discharge instructions). 14.Clinimix E 5/20 TNA solution. The patient will be off TNA from 6 a.m. to 6 p.m.  Refer to patient     medication discharge instructions for infusion rates.   CONSULTATIONS: 1. [facility]. 2. Interventional Radiology consult for a PICC line.  PROCEDURES:  PICC line placement.  DISCHARGE DIAGNOSES: 1. Small bowel obstruction, recurrent, resolving. 2. Anastomotic ulcers (history of Billroth I), healing. 3. Malnutrition requiring TNA last admission, this admission, and at     discharge. 4. Coronary artery disease, status post coronary artery bypass graft. 5. History of pacemaker for heart block. 6. Atrial fibrillation. 7. Hypertension. 8. Hyperlipidemia. 9. History of transient ischemic attack. 10.Degenerative joint disease. 11.Chronic anticoagulation. 12.Abdominal aortic aneurysm.  HOSPITAL COURSE:   Mr. Matsuo is a complicated 77 year old male who was admitted to [facility] for several days in September with nausea and vomiting.  The patient had a known history of recurrent small bowel obstructions which had responded to medical management in the past.  During his  [facility] admission the patient underwent an EGD by [place] and was found to have multiple ulcers at his gastrojejunostomy anastomosis.  The imaging studies had suggested a gastric outlet obstruction but there was no obstructive process on upper endoscopy.  It was felt the patient likely had gastroparesis instead.  Mr. Reichardt had been on Pradaxa and there was a question of the medication may have contributed to formation of ulcers so his Pradaxa was discontinued and the patient was discharged home on Coumadin.  An outpatient followup EGD showed significant improvement with only 1 small ulcer left at the anastomosis.  The patient continued  to be symptomatic however with nausea, intermittent vomiting, and poor appetite.   He was therefore readmitted to the hospital at which time a small bowel obstruction was found.  The patient did require NGT decompression.  Prior to this admission, Mr. Boakye Coumadin had been changed to Xarelto, we initially kept that going in the hospital but when Mr. Lieurance needed to be  n.p.o., we consulted pharmacy for heparin drip.  The patient was started on TNA early in the course of his hospitalization.  [facility] evaluated the patient and agreed NG tube for decompression and bowel rest.  Subsequent abdominal films did show improvement in small bowel obstruction.  By the third or fourth day of admission, the patient had began having bowel movements. He was tried on a clear liquid diet which he tolerated and so from there he was advanced to full liquids.  Shortly thereafter his diet was advanced to bland solids but he was unable to consume very much of the diet, so he was put back to full  liquids.  Surgery continued to follow the patient throughout this admission, yesterday his acute abdominal series showed improving small bowel obstruction. Clinically, the patient did not seem obstructed and he wasn't  interested in surgery anyway.  On the day of discharge, the patient's vital signs were stable.  He was having bowel movements and tolerating full liquids.  The patient had been accepted at [facility] where he would continue TNA.  The social worker spoke to [place] and they would not be able to start TNA immediately upon the patient's arrival. Apparently, some of the staff members would need to be trained on PICC line/TNA and that was supposed to occur this evening.  The patient will need to start TNA tomorrow (March 26, 2011) at 6 p.m.     Willette Cluster, NP   ______________________________ Hedwig Morton. Juanda Chance, MD    PG/MEDQ  D:  03/25/2011  T:  03/25/2011  Job:  [id number]  Electronically Signed by Willette Cluster NP on 04/06/2011 06:12:20 PM Electronically Signed by Lina Sar MD on 04/12/2011 05:28:33 PM

## 2011-04-12 NOTE — Discharge Summary (Signed)
  [name], [name] NO.:  [id number]  MEDICAL RECORD NO.:  [id number]  LOCATION:  1412                         FACILITY:  [facility]  PHYSICIAN:  Hedwig Morton. Juanda Chance, MD     DATE OF BIRTH:  01-30-1926  DATE OF ADMISSION:  03/15/2011 DATE OF DISCHARGE:  03/25/2011                        DISCHARGE SUMMARY - REFERRING   DISPOSITION:  Transferred in stable condition to [facility].     Eric Cluster, NP   ______________________________ Hedwig Morton. Juanda Chance, MD    PG/MEDQ  D:  03/25/2011  T:  03/25/2011  Job:  [id number]  Electronically Signed by Eric Cluster NP on 04/06/2011 05:59:52 PM Electronically Signed by Lina Sar MD on 04/12/2011 05:28:31 PM

## 2011-04-16 ENCOUNTER — Emergency Department: Payer: Medicare Other | Admitting: Emergency Medicine

## 2011-04-18 ENCOUNTER — Inpatient Hospital Stay: Payer: Medicare Other | Admitting: Specialist

## 2011-04-19 DIAGNOSIS — R0602 Shortness of breath: Secondary | ICD-10-CM

## 2011-04-19 DIAGNOSIS — I059 Rheumatic mitral valve disease, unspecified: Secondary | ICD-10-CM

## 2011-04-21 ENCOUNTER — Telehealth: Payer: Self-pay | Admitting: *Deleted

## 2011-04-21 DIAGNOSIS — I25810 Atherosclerosis of coronary artery bypass graft(s) without angina pectoris: Secondary | ICD-10-CM

## 2011-04-21 DIAGNOSIS — R0602 Shortness of breath: Secondary | ICD-10-CM

## 2011-04-21 NOTE — Telephone Encounter (Signed)
Per Dr. Mariah Milling, pt scheduled for labs (cbc, bmp, bnp) 1 week from hosp discharge, and he has 2 week f/u with Dr. Daleen Squibb. Pt aware.

## 2011-04-22 ENCOUNTER — Other Ambulatory Visit: Payer: Self-pay

## 2011-04-22 ENCOUNTER — Ambulatory Visit (INDEPENDENT_AMBULATORY_CARE_PROVIDER_SITE_OTHER): Payer: Medicare Other | Admitting: *Deleted

## 2011-04-22 ENCOUNTER — Encounter: Payer: Self-pay | Admitting: Internal Medicine

## 2011-04-22 DIAGNOSIS — Z950 Presence of cardiac pacemaker: Secondary | ICD-10-CM

## 2011-04-22 DIAGNOSIS — I4891 Unspecified atrial fibrillation: Secondary | ICD-10-CM

## 2011-04-23 ENCOUNTER — Telehealth: Payer: Self-pay | Admitting: Cardiology

## 2011-04-23 LAB — REMOTE PACEMAKER DEVICE
AL AMPLITUDE: 1 mv
AL IMPEDENCE PM: 489 Ohm
ATRIAL PACING PM: 8
BAMS-0001: 150 {beats}/min
BRDY-0004RV: 130 {beats}/min
RV LEAD IMPEDENCE PM: 505 Ohm
RV LEAD THRESHOLD: 0.625 V
VENTRICULAR PACING PM: 49

## 2011-04-23 NOTE — Telephone Encounter (Signed)
LMOVM. Okay per Dr. Daleen Squibb to start Juven ( protein supplement) [name] RN

## 2011-04-23 NOTE — Telephone Encounter (Signed)
[facility] problem [facility] She wants an order for protein supplement Juven

## 2011-04-26 NOTE — Telephone Encounter (Signed)
Pt and family are aware [facility] will be drawing labs. [name] RN

## 2011-04-26 NOTE — Telephone Encounter (Signed)
Eric [name] will draw bmet and cbc tomorrow per Dr. Daleen Squibb. [name] RN

## 2011-04-27 ENCOUNTER — Other Ambulatory Visit: Payer: Medicare Other | Admitting: *Deleted

## 2011-04-28 ENCOUNTER — Encounter: Payer: Self-pay | Admitting: *Deleted

## 2011-04-28 ENCOUNTER — Encounter: Payer: Self-pay | Admitting: Cardiology

## 2011-04-28 NOTE — Progress Notes (Signed)
Remote pacer check  

## 2011-05-04 ENCOUNTER — Ambulatory Visit (INDEPENDENT_AMBULATORY_CARE_PROVIDER_SITE_OTHER): Payer: Medicare Other | Admitting: Cardiology

## 2011-05-04 ENCOUNTER — Encounter: Payer: Self-pay | Admitting: *Deleted

## 2011-05-04 ENCOUNTER — Encounter: Payer: Self-pay | Admitting: Cardiology

## 2011-05-04 VITALS — BP 122/62 | HR 76 | Ht 70.0 in | Wt 169.0 lb

## 2011-05-04 DIAGNOSIS — I4891 Unspecified atrial fibrillation: Secondary | ICD-10-CM

## 2011-05-04 DIAGNOSIS — I714 Abdominal aortic aneurysm, without rupture: Secondary | ICD-10-CM

## 2011-05-04 DIAGNOSIS — Z8679 Personal history of other diseases of the circulatory system: Secondary | ICD-10-CM

## 2011-05-04 DIAGNOSIS — I10 Essential (primary) hypertension: Secondary | ICD-10-CM

## 2011-05-04 DIAGNOSIS — Z7901 Long term (current) use of anticoagulants: Secondary | ICD-10-CM

## 2011-05-04 DIAGNOSIS — G4733 Obstructive sleep apnea (adult) (pediatric): Secondary | ICD-10-CM

## 2011-05-04 DIAGNOSIS — I25810 Atherosclerosis of coronary artery bypass graft(s) without angina pectoris: Secondary | ICD-10-CM

## 2011-05-04 DIAGNOSIS — I452 Bifascicular block: Secondary | ICD-10-CM

## 2011-05-04 DIAGNOSIS — Z950 Presence of cardiac pacemaker: Secondary | ICD-10-CM

## 2011-05-04 DIAGNOSIS — I679 Cerebrovascular disease, unspecified: Secondary | ICD-10-CM

## 2011-05-04 DIAGNOSIS — E785 Hyperlipidemia, unspecified: Secondary | ICD-10-CM

## 2011-05-04 DIAGNOSIS — I441 Atrioventricular block, second degree: Secondary | ICD-10-CM

## 2011-05-04 MED ORDER — ASPIRIN 81 MG PO TABS: 81.0000 mg | ORAL_TABLET | Freq: Every day | ORAL | Status: DC

## 2011-05-04 MED ORDER — RIVAROXABAN 20 MG PO TABS: 20.0000 mg | ORAL_TABLET | Freq: Every day | ORAL | Status: DC

## 2011-05-04 NOTE — Progress Notes (Signed)
HPI Eric [name]  Comes in today with close followup of his complex cardiovascular history.  Was recently admitted to [facility] or increasing lower extremity edema and shortness of breath. His oxygen saturations also dropped. This was actually picked up by home health.   Was evaluated by Dr. Mariah Milling of our team.. He was diuresed successfully. Echocardiogram showed good left ventricular function with an EF of 55% he did have evidence of some right-sided pressure overload. Please refer to the discharge summary.  He has done well on 20 mg of Lasix a day and potassium supplementation. His weight has been stable. His appetite is improved. Last week, his hemoglobin was 9.1. He is still on iron. His electrolytes and renal function were stable.  He's had recurrent nosebleeds. He has been off anticoagulation now for about a week and a half. He had been on Xarelto.  He was evaluated in followup by Dr. Jenne Campus yesterday. He and I talked on the phone. He felt like his nose was totally healed and look the best it had looked in a while. We both feel that he needs to be on anticoagulation. His CHADS2 score is 5. He  He did not do well on Coumadin with a up and down INR. That is when we changed him to an anti-thrombin.  Past Medical History  Diagnosis Date  . BPH (benign prostatic hypertrophy)   . DJD (degenerative joint disease)   . Hx of transient ischemic attack (TIA)   . AAA (abdominal aortic aneurysm)   . Cerebrovascular disease   . HLD (hyperlipidemia)   . HTN (hypertension)   . Second degree AV block, Mobitz type II     s/p PPM  . CAD (coronary artery disease)   . Persistent atrial fibrillation     on pradaxa  . Sleep apnea   . Gastric ulcer, acute 02/2011    multiple - NSAID/Pradaxa thought causative. associated gastroparesis-like problems  . Small bowel obstruction     multiple over the years    Current Outpatient Prescriptions  Medication Sig Dispense Refill  . docusate sodium (COLACE) 100  MG capsule Take 100 mg by mouth daily.        . ferrous sulfate 325 (65 FE) MG tablet Take 1 tablet (325 mg total) by mouth daily.  30 tablet  3  . finasteride (PROSCAR) 5 MG tablet Take 1 tablet (5 mg total) by mouth daily.  30 tablet  11  . furosemide (LASIX) 20 MG tablet Take 1 tablet by mouth Daily.      Marland Kitchen KLOR-CON M20 20 MEQ tablet Take 1 tablet by mouth Daily.      . Multiple Vitamin (MULTIVITAMINS PO) Take 1 tablet by mouth daily.        . pantoprazole (PROTONIX) 40 MG tablet Take 40 mg by mouth daily.        . quinapril (ACCUPRIL) 20 MG tablet TAKE 1 TABLET EVERY DAY  30 tablet  6  . Tamsulosin HCl (FLOMAX) 0.4 MG CAPS Take 1 capsule (0.4 mg total) by mouth daily after supper.  30 capsule  2  . traMADol (ULTRAM) 50 MG tablet Take 1 tablet (50 mg total) by mouth every 6 (six) hours as needed for pain.  30 tablet  3    Allergies  Allergen Reactions  . Morphine   . Penicillins   . Phenergan     CHANGE IN BEHAVIOR    Family History  Problem Relation Age of Onset  . Coronary artery disease Father   .  Colon cancer Brother     History   Social History  . Marital Status: Married    Spouse Name: N/A    Number of Children: N/A  . Years of Education: N/A   Occupational History  . retired    Social History Main Topics  . Smoking status: Former Smoker    Quit date: 06/07/1968  . Smokeless tobacco: Never Used  . Alcohol Use: No  . Drug Use: No  . Sexually Active: Not on file   Other Topics Concern  . Not on file   Social History Narrative  . No narrative on file    ROS ALL NEGATIVE EXCEPT THOSE NOTED IN HPI  PE  General Appearance: well developed, well nourished in no acute distress, color has improved HEENT: symmetrical face, PERRLA, good dentition  Neck: no JVD, thyromegaly, or adenopathy, trachea midline Chest: symmetric without deformity Cardiac: PMI non-displaced, RRR, normal S1, S2, no gallop or murmur Lung: clear to ausculation and percussion Vascular:  all pulses full without bruits  Abdominal: nondistended, nontender, good bowel sounds, no HSM, no bruits Extremities: no cyanosis, clubbing , 1+ pedal edema,no sign of DVT, no varicosities  Skin: normal color, no rashes Neuro: alert and oriented x 3, non-focal Pysch: normal affect  EKG  BMET    Component Value Date/Time   NA 127* 03/25/2011 0405   K 4.9 03/25/2011 0405   CL 98 03/25/2011 0405   CO2 22 03/25/2011 0405   GLUCOSE 137* 03/25/2011 0405   BUN 44* 03/25/2011 0405   CREATININE 0.99 03/25/2011 0405   CALCIUM 8.6 03/25/2011 0405   GFRNONAA 74* 03/25/2011 0405   GFRAA 85* 03/25/2011 0405    Lipid Panel     Component Value Date/Time   CHOL 98 03/22/2011 0345   TRIG 52 03/24/2011 0625   HDL 56 02/11/2009 0000   CHOLHDL 2.2 Ratio 02/11/2009 0000   VLDL 8 02/11/2009 0000   LDLCALC 59 02/11/2009 0000    CBC    Component Value Date/Time   WBC 10.0 03/25/2011 0405   RBC 3.00* 03/25/2011 0405   HGB 8.7* 03/25/2011 0405   HCT 26.2* 03/25/2011 0405   PLT 194 03/25/2011 0405   MCV 87.3 03/25/2011 0405   MCH 29.0 03/25/2011 0405   MCHC 33.2 03/25/2011 0405   RDW 14.7 03/25/2011 0405   LYMPHSABS 1.5 03/23/2011 0500   MONOABS 1.5* 03/23/2011 0500   EOSABS 0.2 03/23/2011 0500   BASOSABS 0.0 03/23/2011 0500

## 2011-05-04 NOTE — Patient Instructions (Signed)
Your physician has recommended you make the following change in your medication:  start Xarelto and Aspirin Your physician recommends that you schedule a follow-up appointment in: 6 weeks with Dr.Wall

## 2011-05-04 NOTE — Assessment & Plan Note (Signed)
Until his nutritional status improves, I will hold off on adding back his statin. Reassess in 6 weeks.

## 2011-05-04 NOTE — Assessment & Plan Note (Signed)
We will restart Xarelto at 20 mg per day. His creatinine clearance is in the 60s. I discussed this with Dr. Abner Greenspan of our anticoagulation clinic.  I plan on seeing him back for close followup in 4-6 weeks. I will be in touch by phone as well.

## 2011-05-04 NOTE — Assessment & Plan Note (Signed)
Restart low-dose aspirin 81 mg a day.

## 2011-05-07 ENCOUNTER — Encounter: Payer: Self-pay | Admitting: Cardiology

## 2011-05-08 ENCOUNTER — Encounter: Payer: Medicare Other | Admitting: Internal Medicine

## 2011-05-14 ENCOUNTER — Telehealth: Payer: Self-pay | Admitting: Cardiology

## 2011-05-14 NOTE — Telephone Encounter (Signed)
New msg [facility] wants to continue physical therapy for two weeks Need order by today

## 2011-05-14 NOTE — Telephone Encounter (Signed)
LMOVM to continue physical therapy as requested & fax order. Called [facility] directly to continue PT. [name] RN

## 2011-06-08 ENCOUNTER — Encounter: Payer: Self-pay | Admitting: Internal Medicine

## 2011-06-10 ENCOUNTER — Other Ambulatory Visit: Payer: Self-pay | Admitting: *Deleted

## 2011-06-10 MED ORDER — FUROSEMIDE 20 MG PO TABS: 20.0000 mg | ORAL_TABLET | Freq: Every day | ORAL | Status: DC

## 2011-06-16 ENCOUNTER — Encounter: Payer: Self-pay | Admitting: Cardiology

## 2011-06-16 ENCOUNTER — Ambulatory Visit (INDEPENDENT_AMBULATORY_CARE_PROVIDER_SITE_OTHER): Payer: Medicare Other | Admitting: Cardiology

## 2011-06-16 VITALS — BP 118/68 | HR 72 | Ht 70.0 in | Wt 170.0 lb

## 2011-06-16 DIAGNOSIS — Z7901 Long term (current) use of anticoagulants: Secondary | ICD-10-CM

## 2011-06-16 DIAGNOSIS — I714 Abdominal aortic aneurysm, without rupture: Secondary | ICD-10-CM

## 2011-06-16 DIAGNOSIS — E785 Hyperlipidemia, unspecified: Secondary | ICD-10-CM

## 2011-06-16 DIAGNOSIS — I452 Bifascicular block: Secondary | ICD-10-CM

## 2011-06-16 DIAGNOSIS — I4891 Unspecified atrial fibrillation: Secondary | ICD-10-CM

## 2011-06-16 DIAGNOSIS — I441 Atrioventricular block, second degree: Secondary | ICD-10-CM

## 2011-06-16 DIAGNOSIS — I25810 Atherosclerosis of coronary artery bypass graft(s) without angina pectoris: Secondary | ICD-10-CM

## 2011-06-16 DIAGNOSIS — I10 Essential (primary) hypertension: Secondary | ICD-10-CM

## 2011-06-16 DIAGNOSIS — G4733 Obstructive sleep apnea (adult) (pediatric): Secondary | ICD-10-CM

## 2011-06-16 DIAGNOSIS — D62 Acute posthemorrhagic anemia: Secondary | ICD-10-CM

## 2011-06-16 DIAGNOSIS — Z8679 Personal history of other diseases of the circulatory system: Secondary | ICD-10-CM

## 2011-06-16 DIAGNOSIS — Z950 Presence of cardiac pacemaker: Secondary | ICD-10-CM

## 2011-06-16 DIAGNOSIS — I679 Cerebrovascular disease, unspecified: Secondary | ICD-10-CM

## 2011-06-16 LAB — CBC WITH DIFFERENTIAL/PLATELET
Basophils Absolute: 0 10*3/uL (ref 0.0–0.1)
Basophils Relative: 0.5 % (ref 0.0–3.0)
Eosinophils Absolute: 0.2 10*3/uL (ref 0.0–0.7)
Lymphocytes Relative: 26.9 % (ref 12.0–46.0)
MCHC: 33 g/dL (ref 30.0–36.0)
MCV: 88.4 fl (ref 78.0–100.0)
Monocytes Absolute: 0.9 10*3/uL (ref 0.1–1.0)
Neutrophils Relative %: 58.5 % (ref 43.0–77.0)
Platelets: 172 10*3/uL (ref 150.0–400.0)
RBC: 3.86 Mil/uL — ABNORMAL LOW (ref 4.22–5.81)

## 2011-06-16 LAB — BASIC METABOLIC PANEL
Chloride: 104 mEq/L (ref 96–112)
GFR: 66.43 mL/min (ref >60.00)
Potassium: 4.1 mEq/L (ref 3.5–5.1)
Sodium: 138 mEq/L (ref 135–145)

## 2011-06-16 LAB — HEPATIC FUNCTION PANEL
ALT: 21 U/L (ref 0–53)
AST: 22 U/L (ref 0–37)
Alkaline Phosphatase: 76 U/L (ref 39–117)
Bilirubin, Direct: 0.1 mg/dL (ref 0.0–0.3)
Total Bilirubin: 0.6 mg/dL (ref 0.3–1.2)

## 2011-06-16 MED ORDER — SIMVASTATIN 20 MG PO TABS: 20.0000 mg | ORAL_TABLET | Freq: Every evening | ORAL | Status: DC

## 2011-06-16 MED ORDER — QUINAPRIL HCL 10 MG PO TABS: 10.0000 mg | ORAL_TABLET | Freq: Every day | ORAL | Status: DC

## 2011-06-16 NOTE — Assessment & Plan Note (Signed)
Will restart statin.  

## 2011-06-16 NOTE — Progress Notes (Signed)
HPI [name] comes in today for close followup of his history of congestive heart failure. He is done remarkably well and has gained a couple of pounds of bleeding body mass. His appetite is incredibly good and he looks remarkably better. His balance is improving he has not fallen again. He does get dizzy sometimes when he sits too long.  I stopped his statin when he was so sick. Now he is eating we'll start it back. He is due blood work.  He is normal left ventricular systolic function some mild pulmonary hypertension. A lot of this was probably due to severe malnutrition with capillary leak. He probably has diastolic dysfunction as well.  Past Medical History  Diagnosis Date  . BPH (benign prostatic hypertrophy)   . DJD (degenerative joint disease)   . Hx of transient ischemic attack (TIA)   . AAA (abdominal aortic aneurysm)   . Cerebrovascular disease   . HLD (hyperlipidemia)   . HTN (hypertension)   . Second degree AV block, Mobitz type II     s/p PPM  . CAD (coronary artery disease)   . Persistent atrial fibrillation     on pradaxa  . Sleep apnea   . Gastric ulcer, acute 02/2011    multiple - NSAID/Pradaxa thought causative. associated gastroparesis-like problems  . Small bowel obstruction     multiple over the years    Current Outpatient Prescriptions  Medication Sig Dispense Refill  . aspirin 81 MG tablet Take 1 tablet (81 mg total) by mouth daily.      Marland Kitchen docusate sodium (COLACE) 100 MG capsule Take 100 mg by mouth daily.        . ferrous sulfate 325 (65 FE) MG tablet Take 1 tablet (325 mg total) by mouth daily.  30 tablet  3  . finasteride (PROSCAR) 5 MG tablet Take 1 tablet (5 mg total) by mouth daily.  30 tablet  11  . furosemide (LASIX) 20 MG tablet Take 1 tablet (20 mg total) by mouth daily.  30 tablet  5  . KLOR-CON M20 20 MEQ tablet Take 1 tablet by mouth Daily.      . Multiple Vitamin (MULTIVITAMINS PO) Take 1 tablet by mouth daily.        . pantoprazole (PROTONIX)  40 MG tablet Take 40 mg by mouth daily.        . quinapril (ACCUPRIL) 10 MG tablet Take 1 tablet (10 mg total) by mouth daily.  30 tablet  11  . Rivaroxaban (XARELTO) 20 MG TABS Take 20 mg by mouth daily.  30 tablet  6  . Tamsulosin HCl (FLOMAX) 0.4 MG CAPS Take 1 capsule (0.4 mg total) by mouth daily after supper.  30 capsule  2  . traMADol (ULTRAM) 50 MG tablet Take 1 tablet (50 mg total) by mouth every 6 (six) hours as needed for pain.  30 tablet  3  . simvastatin (ZOCOR) 20 MG tablet Take 1 tablet (20 mg total) by mouth every evening.  30 tablet  11    Allergies  Allergen Reactions  . Morphine   . Penicillins   . Phenergan     CHANGE IN BEHAVIOR    Family History  Problem Relation Age of Onset  . Coronary artery disease Father   . Colon cancer Brother     History   Social History  . Marital Status: Married    Spouse Name: N/A    Number of Children: N/A  . Years of Education: N/A  Occupational History  . retired    Social History Main Topics  . Smoking status: Former Smoker    Quit date: 06/07/1968  . Smokeless tobacco: Never Used  . Alcohol Use: No  . Drug Use: No  . Sexually Active: Not on file   Other Topics Concern  . Not on file   Social History Narrative  . No narrative on file    ROS ALL NEGATIVE EXCEPT THOSE NOTED IN HPI  PE  General Appearance: well developed, well nourished in no acute distress, looks much stronger, color much improved HEENT: symmetrical face, PERRLA, good dentition  Neck: no JVD, thyromegaly, or adenopathy, trachea midline Chest: symmetric without deformity Cardiac: PMI non-displaced, RRR, normal S1, S2, no gallop or murmur Lung: clear to ausculation and percussion Vascular: all pulses full without bruits  Abdominal: nondistended, nontender, good bowel sounds, no HSM, no bruits Extremities: no cyanosis, clubbing or edema, no sign of DVT, no varicosities  Skin: normal color, no rashes Neuro: alert and oriented x 3,  non-focal Pysch: normal affect  EKG  BMET    Component Value Date/Time   NA 127* 03/25/2011 0405   K 4.9 03/25/2011 0405   CL 98 03/25/2011 0405   CO2 22 03/25/2011 0405   GLUCOSE 137* 03/25/2011 0405   BUN 44* 03/25/2011 0405   CREATININE 0.99 03/25/2011 0405   CALCIUM 8.6 03/25/2011 0405   GFRNONAA 74* 03/25/2011 0405   GFRAA 85* 03/25/2011 0405    Lipid Panel     Component Value Date/Time   CHOL 98 03/22/2011 0345   TRIG 52 03/24/2011 0625   HDL 56 02/11/2009 0000   CHOLHDL 2.2 Ratio 02/11/2009 0000   VLDL 8 02/11/2009 0000   LDLCALC 59 02/11/2009 0000    CBC    Component Value Date/Time   WBC 10.0 03/25/2011 0405   RBC 3.00* 03/25/2011 0405   HGB 8.7* 03/25/2011 0405   HCT 26.2* 03/25/2011 0405   PLT 194 03/25/2011 0405   MCV 87.3 03/25/2011 0405   MCH 29.0 03/25/2011 0405   MCHC 33.2 03/25/2011 0405   RDW 14.7 03/25/2011 0405   LYMPHSABS 1.5 03/23/2011 0500   MONOABS 1.5* 03/23/2011 0500   EOSABS 0.2 03/23/2011 0500   BASOSABS 0.0 03/23/2011 0500

## 2011-06-16 NOTE — Assessment & Plan Note (Signed)
He remains on iron. We'll check CBC. Hopefully he is back up to around 12 or more.

## 2011-06-16 NOTE — Patient Instructions (Addendum)
Your physician recommends that you schedule a follow-up appointment in: 6 weeks.  Your physician has recommended you make the following change in your medication:  Restart Simvastatin (Lipitor) 20mg  by mouth every evening.  Your physician recommends that you have the following lab tests today:  CBC, BMET, LFT.  Your physician has recommended you make the following change in your medication:   Reduce quinapril dose to 10mg  daily.

## 2011-06-16 NOTE — Assessment & Plan Note (Signed)
Blood pressures probably too low for him. We'll decrease his ACE to 10 mg a day

## 2011-06-18 ENCOUNTER — Other Ambulatory Visit: Payer: Self-pay | Admitting: Cardiology

## 2011-06-25 ENCOUNTER — Ambulatory Visit: Payer: Self-pay | Admitting: Pharmacist

## 2011-06-25 DIAGNOSIS — I4891 Unspecified atrial fibrillation: Secondary | ICD-10-CM

## 2011-06-25 DIAGNOSIS — Z7901 Long term (current) use of anticoagulants: Secondary | ICD-10-CM

## 2011-07-09 ENCOUNTER — Encounter: Payer: Self-pay | Admitting: Internal Medicine

## 2011-07-23 ENCOUNTER — Encounter: Payer: Self-pay | Admitting: *Deleted

## 2011-07-23 ENCOUNTER — Ambulatory Visit (INDEPENDENT_AMBULATORY_CARE_PROVIDER_SITE_OTHER): Payer: Medicare Other | Admitting: Cardiology

## 2011-07-23 ENCOUNTER — Encounter: Payer: Self-pay | Admitting: Cardiology

## 2011-07-23 VITALS — BP 151/83 | HR 85 | Ht 70.0 in | Wt 172.0 lb

## 2011-07-23 DIAGNOSIS — I509 Heart failure, unspecified: Secondary | ICD-10-CM

## 2011-07-23 DIAGNOSIS — I5032 Chronic diastolic (congestive) heart failure: Secondary | ICD-10-CM

## 2011-07-23 DIAGNOSIS — I679 Cerebrovascular disease, unspecified: Secondary | ICD-10-CM

## 2011-07-23 DIAGNOSIS — Z7901 Long term (current) use of anticoagulants: Secondary | ICD-10-CM

## 2011-07-23 DIAGNOSIS — I10 Essential (primary) hypertension: Secondary | ICD-10-CM

## 2011-07-23 DIAGNOSIS — I441 Atrioventricular block, second degree: Secondary | ICD-10-CM

## 2011-07-23 DIAGNOSIS — I4891 Unspecified atrial fibrillation: Secondary | ICD-10-CM

## 2011-07-23 DIAGNOSIS — Z950 Presence of cardiac pacemaker: Secondary | ICD-10-CM

## 2011-07-23 DIAGNOSIS — E785 Hyperlipidemia, unspecified: Secondary | ICD-10-CM

## 2011-07-23 DIAGNOSIS — I714 Abdominal aortic aneurysm, without rupture: Secondary | ICD-10-CM

## 2011-07-23 DIAGNOSIS — I25810 Atherosclerosis of coronary artery bypass graft(s) without angina pectoris: Secondary | ICD-10-CM

## 2011-07-23 DIAGNOSIS — G4733 Obstructive sleep apnea (adult) (pediatric): Secondary | ICD-10-CM

## 2011-07-23 NOTE — Progress Notes (Signed)
HPI  Mr Eric [name] returns for close followup of his chronic diastolic heart failure and multiple cardiac issues. Please see the assessment and plan.  He is doing remarkably well except for chronic back pain and arthritic knees. He is having a lot of pain but is able to sleep at night. He does not like to take pain medicine such as tramadol.  He denies any significant change in weight. His weight is up to pounds since last visit. His appetite is good. He denies any orthopnea, PND or edema. He's had no further falls. He denies any bleeding or constipation. He denies any angina or symptoms of TIAs.    Past Medical History  Diagnosis Date  . BPH (benign prostatic hypertrophy)   . DJD (degenerative joint disease)   . Hx of transient ischemic attack (TIA)   . AAA (abdominal aortic aneurysm)   . Cerebrovascular disease   . HLD (hyperlipidemia)   . HTN (hypertension)   . Second degree AV block, Mobitz type II     s/p PPM  . CAD (coronary artery disease)   . Persistent atrial fibrillation     on pradaxa  . Sleep apnea   . Gastric ulcer, acute 02/2011    multiple - NSAID/Pradaxa thought causative. associated gastroparesis-like problems  . Small bowel obstruction     multiple over the years    Current Outpatient Prescriptions  Medication Sig Dispense Refill  . aspirin 81 MG tablet Take 1 tablet (81 mg total) by mouth daily.      Marland Kitchen docusate sodium (COLACE) 100 MG capsule Take 100 mg by mouth daily.        . ferrous sulfate 325 (65 FE) MG tablet Take 1 tablet (325 mg total) by mouth daily.  30 tablet  3  . finasteride (PROSCAR) 5 MG tablet Take 1 tablet (5 mg total) by mouth daily.  30 tablet  11  . furosemide (LASIX) 20 MG tablet Take 1 tablet (20 mg total) by mouth daily.  30 tablet  5  . KLOR-CON M20 20 MEQ tablet TAKE 1 TABLET EVERY DAY  30 each  11  . Multiple Vitamin (MULTIVITAMINS PO) Take 1 tablet by mouth daily.        . pantoprazole (PROTONIX) 40 MG tablet Take 40 mg by mouth daily.         . quinapril (ACCUPRIL) 10 MG tablet Take 1 tablet (10 mg total) by mouth daily.  30 tablet  11  . Rivaroxaban (XARELTO) 20 MG TABS Take 20 mg by mouth daily.  30 tablet  6  . simvastatin (ZOCOR) 20 MG tablet Take 1 tablet (20 mg total) by mouth every evening.  30 tablet  11  . Tamsulosin HCl (FLOMAX) 0.4 MG CAPS Take 1 capsule (0.4 mg total) by mouth daily after supper.  30 capsule  2  . traMADol (ULTRAM) 50 MG tablet Take 1 tablet (50 mg total) by mouth every 6 (six) hours as needed for pain.  30 tablet  3    Allergies  Allergen Reactions  . Morphine   . Penicillins   . Phenergan     CHANGE IN BEHAVIOR    Family History  Problem Relation Age of Onset  . Coronary artery disease Father   . Colon cancer Brother     History   Social History  . Marital Status: Married    Spouse Name: N/A    Number of Children: N/A  . Years of Education: N/A   Occupational History  .  retired    Social History Main Topics  . Smoking status: Former Smoker    Quit date: 06/07/1968  . Smokeless tobacco: Never Used  . Alcohol Use: No  . Drug Use: No  . Sexually Active: Not on file   Other Topics Concern  . Not on file   Social History Narrative  . No narrative on file    ROS ALL NEGATIVE EXCEPT THOSE NOTED IN HPI  PE  General Appearance: well developed, well nourished in no acute distress HEENT: symmetrical face, PERRLA,   Neck: no JVD, thyromegaly, or adenopathy, trachea midline Chest: symmetric without deformity Cardiac: PMI non-displaced, Irregular rate and rhythm, normal S1, S2, no gallop or murmur Lung: clear to ausculation and percussion Vascular: all pulses full without bruits  Abdominal: nondistended, nontender, good bowel sounds, no HSM, no bruits Extremities: no cyanosis, clubbing or edema, no sign of DVT, no varicosities  Skin: normal color, no rashes Neuro: alert and oriented x 3, non-focal Pysch: normal affect  EKG Not repeated BMET    Component Value  Date/Time   NA 138 06/16/2011 1435   K 4.1 06/16/2011 1435   CL 104 06/16/2011 1435   CO2 27 06/16/2011 1435   GLUCOSE 98 06/16/2011 1435   BUN 28* 06/16/2011 1435   CREATININE 1.1 06/16/2011 1435   CALCIUM 8.7 06/16/2011 1435   GFRNONAA 74* 03/25/2011 0405   GFRAA 85* 03/25/2011 0405    Lipid Panel     Component Value Date/Time   CHOL 98 03/22/2011 0345   TRIG 52 03/24/2011 0625   HDL 56 02/11/2009 0000   CHOLHDL 2.2 Ratio 02/11/2009 0000   VLDL 8 02/11/2009 0000   LDLCALC 59 02/11/2009 0000    CBC    Component Value Date/Time   WBC 7.8 06/16/2011 1435   RBC 3.86* 06/16/2011 1435   HGB 11.3* 06/16/2011 1435   HCT 34.1* 06/16/2011 1435   PLT 172.0 06/16/2011 1435   MCV 88.4 06/16/2011 1435   MCH 29.0 03/25/2011 0405   MCHC 33.0 06/16/2011 1435   RDW 17.2* 06/16/2011 1435   LYMPHSABS 2.1 06/16/2011 1435   MONOABS 0.9 06/16/2011 1435   EOSABS 0.2 06/16/2011 1435   BASOSABS 0.0 06/16/2011 1435

## 2011-07-23 NOTE — Assessment & Plan Note (Signed)
Stable. Continued secondary preventive care

## 2011-07-23 NOTE — Assessment & Plan Note (Signed)
Repeat abdominal ultrasound in May.

## 2011-07-23 NOTE — Assessment & Plan Note (Signed)
Rate control and anticoagulation. Cleared for dental surgery with instructions on how to stop and start his anticoagulation. Note written.

## 2011-07-23 NOTE — Assessment & Plan Note (Signed)
Stable. Continue current medical therapy. 

## 2011-07-23 NOTE — Assessment & Plan Note (Signed)
Repeat carotid Dopplers in May. I

## 2011-07-23 NOTE — Patient Instructions (Addendum)
Your physician wants you to follow-up in: June 2013  You will receive a reminder letter in the mail two months in advance. If you don't receive a letter, please call our office to schedule the follow-up appointment.  Your physician has requested that you have a carotid duplex in May 2013. This test is an ultrasound of the carotid arteries in your neck. It looks at blood flow through these arteries that supply the brain with blood. Allow one hour for this exam. There are no restrictions or special instructions.  Your physician has requested that you have an abdominal aorta duplex May 2013. During this test, an ultrasound is used to evaluate the aorta. Allow 30 minutes for this exam. Do not eat after midnight the day before and avoid carbonated beverages

## 2011-07-29 ENCOUNTER — Ambulatory Visit (INDEPENDENT_AMBULATORY_CARE_PROVIDER_SITE_OTHER): Payer: Medicare Other | Admitting: *Deleted

## 2011-07-29 ENCOUNTER — Encounter: Payer: Self-pay | Admitting: Internal Medicine

## 2011-07-29 DIAGNOSIS — I4891 Unspecified atrial fibrillation: Secondary | ICD-10-CM

## 2011-07-29 DIAGNOSIS — Z950 Presence of cardiac pacemaker: Secondary | ICD-10-CM

## 2011-07-30 LAB — REMOTE PACEMAKER DEVICE
AL IMPEDENCE PM: 494 Ohm
ATRIAL PACING PM: 10
BATTERY VOLTAGE: 2.78 V
BRDY-0002RV: 60 {beats}/min
BRDY-0003RV: 130 {beats}/min
BRDY-0004RV: 130 {beats}/min
RV LEAD AMPLITUDE: 16 mv
VENTRICULAR PACING PM: 46

## 2011-08-04 ENCOUNTER — Telehealth: Payer: Self-pay | Admitting: *Deleted

## 2011-08-04 NOTE — Telephone Encounter (Signed)
I spoke with Eric [name] about upcoming carotid doppler and abd aorta. These appointments have been transferred to the [facility] office  Carotid scheduled for 5/16 at 2;30 abd aorta   5/21 at 8:30 am [name] RN

## 2011-08-05 NOTE — Progress Notes (Signed)
Remote pacer check  

## 2011-08-09 ENCOUNTER — Other Ambulatory Visit: Payer: Self-pay

## 2011-08-09 MED ORDER — PANTOPRAZOLE SODIUM 40 MG PO TBEC: 40.0000 mg | DELAYED_RELEASE_TABLET | Freq: Every day | ORAL | Status: DC

## 2011-08-13 ENCOUNTER — Encounter: Payer: Self-pay | Admitting: *Deleted

## 2011-08-24 ENCOUNTER — Encounter: Payer: Self-pay | Admitting: Internal Medicine

## 2011-09-01 ENCOUNTER — Other Ambulatory Visit: Payer: Self-pay | Admitting: Cardiology

## 2011-09-13 ENCOUNTER — Other Ambulatory Visit: Payer: Self-pay | Admitting: *Deleted

## 2011-09-13 ENCOUNTER — Other Ambulatory Visit: Payer: Self-pay | Admitting: Cardiology

## 2011-09-13 NOTE — Telephone Encounter (Signed)
Are we to fill this rx?

## 2011-09-16 NOTE — Telephone Encounter (Signed)
I spoke with pharmacist at [facility]. This was prescribed on 08/18/11 by Dr. Daleen Squibb. Pt wife has noted a remarkable difference in pt.  He is currently taking the Effexor XR 75mg  daily.  He does not need refills at this time.  Med list updated. [name] RN

## 2011-09-29 ENCOUNTER — Ambulatory Visit (INDEPENDENT_AMBULATORY_CARE_PROVIDER_SITE_OTHER): Payer: Medicare Other | Admitting: Pulmonary Disease

## 2011-09-29 ENCOUNTER — Encounter: Payer: Self-pay | Admitting: Pulmonary Disease

## 2011-09-29 VITALS — BP 142/64 | HR 90 | Temp 97.7°F | Ht 70.0 in | Wt 166.0 lb

## 2011-09-29 DIAGNOSIS — G4733 Obstructive sleep apnea (adult) (pediatric): Secondary | ICD-10-CM

## 2011-09-29 NOTE — Patient Instructions (Signed)
Make adjustments to the heater on your cpap humidifier based on your level of dryness and the humidity level in your house.  If you need more humidity, increase the heat. Will add chin strap to your current nasal mask to see if we can reduce leak from mouth opening.  If this does not work, would try one of the newer full face masks. Will re-optimize your pressure at home since it has been a long time and you have lost considerable weight.  Will call you with the results once I receive the download. If we solve your various issues, and you are sleeping well with adequate daytime alertness, will see you back in one year.

## 2011-09-29 NOTE — Progress Notes (Signed)
  Subjective:    Patient ID: Eric [name], male    DOB: June 01, 1926, 78 y.o.   MRN: [id number]  HPI The patient comes in today for followup of his obstructive sleep apnea, and to troubleshoot numerous issues with CPAP.  He has not been seen since 2009, and most recently has had issues with nosebleeds/oral blood, and as well as increased daytime sleepiness.  He has been through a recent lengthy and debilitating illness, and has not regained his strength.  He has had an ENT exam with no obvious source of bleeding, but has made adjustments to his heated humidifier with improvement last night.  The patient has had increased sleepiness during the day with periods of inactivity, and it is unclear if his pressure setting is accurate at this time.  He has lost 30 pounds since his original sleep study.  He also uses a nasal mask and has frequent mouth opening according to the wife with oral venting.  He has tried a full face mask, and feels that he is "confined".   Review of Systems  Constitutional: Negative for fever and unexpected weight change.  HENT: Positive for dental problem. Negative for ear pain, nosebleeds, congestion, sore throat, rhinorrhea, sneezing, trouble swallowing, postnasal drip and sinus pressure.   Eyes: Negative for redness and itching.  Respiratory: Negative for cough, chest tightness, shortness of breath and wheezing.   Cardiovascular: Negative for palpitations and leg swelling.  Gastrointestinal: Negative for nausea and vomiting.  Genitourinary: Negative for dysuria.  Musculoskeletal: Negative for joint swelling.  Skin: Negative for rash.  Neurological: Negative for headaches.  Hematological: Does not bruise/bleed easily.  Psychiatric/Behavioral: Negative for dysphoric mood. The patient is not nervous/anxious.        Objective:   Physical Exam Well-developed male in no acute distress Nose without purulence or discharge noted No skin breakdown or pressure necrosis from the  CPAP mask Lower extremities with mild edema, cyanosis Alert and oriented, moves all 4 extremities.       Assessment & Plan:

## 2011-09-29 NOTE — Assessment & Plan Note (Signed)
The patient has a history of severe obstructive sleep apnea, and has been on CPAP since that time.  He has not been seen since 2009, and is having difficulty with blood in his oropharynx and also increasing daytime sleepiness.  He is having ENT exam with nothing found.  I wonder how much of this could be due to severe dryness associated with inadequate humidity on his CPAP machine.  I also wonder if he is losing significant pressure with mouth opening, resulting in breakthrough apneas and increased symptoms during the day.  I have asked the patient to make adjustments to his heated humidifier as needed, and would like to add a chin strap to see if that will help with his mouth opening.  If not, we may have to try a different fullface mask.  I would also like to take this opportunity to optimize his pressure again since it has been so many years.

## 2011-10-18 ENCOUNTER — Other Ambulatory Visit: Payer: Self-pay | Admitting: Cardiology

## 2011-10-18 DIAGNOSIS — I679 Cerebrovascular disease, unspecified: Secondary | ICD-10-CM

## 2011-10-25 ENCOUNTER — Encounter: Payer: Medicare Other | Admitting: Cardiology

## 2011-10-26 ENCOUNTER — Other Ambulatory Visit: Payer: Self-pay | Admitting: Cardiology

## 2011-10-28 ENCOUNTER — Ambulatory Visit (INDEPENDENT_AMBULATORY_CARE_PROVIDER_SITE_OTHER): Payer: Medicare Other | Admitting: *Deleted

## 2011-10-28 ENCOUNTER — Encounter: Payer: Self-pay | Admitting: Internal Medicine

## 2011-10-28 ENCOUNTER — Encounter (INDEPENDENT_AMBULATORY_CARE_PROVIDER_SITE_OTHER): Payer: Medicare Other

## 2011-10-28 ENCOUNTER — Other Ambulatory Visit: Payer: Self-pay | Admitting: Cardiology

## 2011-10-28 DIAGNOSIS — I6529 Occlusion and stenosis of unspecified carotid artery: Secondary | ICD-10-CM

## 2011-10-28 DIAGNOSIS — Z950 Presence of cardiac pacemaker: Secondary | ICD-10-CM

## 2011-10-28 DIAGNOSIS — I714 Abdominal aortic aneurysm, without rupture: Secondary | ICD-10-CM

## 2011-10-28 DIAGNOSIS — R0989 Other specified symptoms and signs involving the circulatory and respiratory systems: Secondary | ICD-10-CM

## 2011-10-28 DIAGNOSIS — I4891 Unspecified atrial fibrillation: Secondary | ICD-10-CM

## 2011-10-28 DIAGNOSIS — I679 Cerebrovascular disease, unspecified: Secondary | ICD-10-CM

## 2011-10-29 LAB — REMOTE PACEMAKER DEVICE
AL AMPLITUDE: 1 mv
BRDY-0002RV: 60 {beats}/min
BRDY-0003RV: 130 {beats}/min
RV LEAD AMPLITUDE: 16 mv
VENTRICULAR PACING PM: 48

## 2011-11-02 ENCOUNTER — Other Ambulatory Visit: Payer: Self-pay | Admitting: *Deleted

## 2011-11-02 MED ORDER — FUROSEMIDE 20 MG PO TABS: 20.0000 mg | ORAL_TABLET | Freq: Every day | ORAL | Status: DC

## 2011-11-03 ENCOUNTER — Ambulatory Visit (INDEPENDENT_AMBULATORY_CARE_PROVIDER_SITE_OTHER): Payer: Medicare Other | Admitting: Cardiology

## 2011-11-03 ENCOUNTER — Encounter: Payer: Self-pay | Admitting: Cardiology

## 2011-11-03 VITALS — BP 110/70 | Wt 168.8 lb

## 2011-11-03 DIAGNOSIS — I509 Heart failure, unspecified: Secondary | ICD-10-CM

## 2011-11-03 DIAGNOSIS — I441 Atrioventricular block, second degree: Secondary | ICD-10-CM

## 2011-11-03 DIAGNOSIS — Z950 Presence of cardiac pacemaker: Secondary | ICD-10-CM

## 2011-11-03 DIAGNOSIS — G4733 Obstructive sleep apnea (adult) (pediatric): Secondary | ICD-10-CM

## 2011-11-03 DIAGNOSIS — I5032 Chronic diastolic (congestive) heart failure: Secondary | ICD-10-CM

## 2011-11-03 DIAGNOSIS — Z7901 Long term (current) use of anticoagulants: Secondary | ICD-10-CM

## 2011-11-03 DIAGNOSIS — I714 Abdominal aortic aneurysm, without rupture, unspecified: Secondary | ICD-10-CM

## 2011-11-03 DIAGNOSIS — I4891 Unspecified atrial fibrillation: Secondary | ICD-10-CM

## 2011-11-03 DIAGNOSIS — E785 Hyperlipidemia, unspecified: Secondary | ICD-10-CM

## 2011-11-03 DIAGNOSIS — I679 Cerebrovascular disease, unspecified: Secondary | ICD-10-CM

## 2011-11-03 DIAGNOSIS — I10 Essential (primary) hypertension: Secondary | ICD-10-CM

## 2011-11-03 NOTE — Assessment & Plan Note (Signed)
Will discontinue for 3 days prior spinal injection at  [facility]. Restart afterwards.

## 2011-11-03 NOTE — Assessment & Plan Note (Signed)
His blood pressure may be a little low at this point in his life. I'll discontinue his low dose quinapril. He will have blood pressure checked at rehabilitation.

## 2011-11-03 NOTE — Progress Notes (Signed)
HPI Eric [name] returns today for evaluation and management of his multiple cardiac and vascular issues. Please see problem list.  His biggest complaint is fatigue. He would like  to stop some of his medications. I stop several over the last year.  He denies angina or symptoms of TIAs. Carotid Dopplers were stable. His weight has been stable. He denies orthopnea PND or edema. He fell recently when he tripped over a cord exercising. No head injury.  He is to have a sleep study to adjust his CPAP. He is sleeping better than he was several months ago however. He has had no further nosebleeds.  He would like to get a spinal injection at [facility] for chronic back pain. We'll need to stop his anticoagulation for 3 days.  Past Medical History  Diagnosis Date  . BPH (benign prostatic hypertrophy)   . DJD (degenerative joint disease)   . Hx of transient ischemic attack (TIA)   . AAA (abdominal aortic aneurysm)   . Cerebrovascular disease   . HLD (hyperlipidemia)   . HTN (hypertension)   . Second degree AV block, Mobitz type II     s/p PPM  . CAD (coronary artery disease)   . Persistent atrial fibrillation     on pradaxa  . Sleep apnea   . Gastric ulcer, acute 02/2011    multiple - NSAID/Pradaxa thought causative. associated gastroparesis-like problems  . Small bowel obstruction     multiple over the years    Current Outpatient Prescriptions  Medication Sig Dispense Refill  . venlafaxine XR (EFFEXOR-XR) 37.5 MG 24 hr capsule TAKE ONE CAPSULE ONCE A DAY FOR 30 DAYS,THEN INCREASE TO 75MG .  30 capsule  6  . aspirin 81 MG tablet Take 1 tablet (81 mg total) by mouth daily.      Marland Kitchen docusate sodium (COLACE) 100 MG capsule Take 100 mg by mouth daily.        . finasteride (PROSCAR) 5 MG tablet TAKE 1 TABLET EVERY DAY  30 tablet  6  . furosemide (LASIX) 20 MG tablet Take 1 tablet (20 mg total) by mouth daily.  30 tablet  7  . KLOR-CON M20 20 MEQ tablet TAKE 1 TABLET EVERY DAY  30 each  11  . Multiple  Vitamin (MULTIVITAMINS PO) Take 1 tablet by mouth daily.        . pantoprazole (PROTONIX) 40 MG tablet Take 1 tablet (40 mg total) by mouth daily.  30 tablet  6  . Rivaroxaban (XARELTO) 20 MG TABS Take 20 mg by mouth daily.  30 tablet  6  . simvastatin (ZOCOR) 20 MG tablet Take 1 tablet (20 mg total) by mouth every evening.  30 tablet  11  . Tamsulosin HCl (FLOMAX) 0.4 MG CAPS Take 1 capsule (0.4 mg total) by mouth daily after supper.  30 capsule  2  . DISCONTD: venlafaxine (EFFEXOR-XR) 75 MG 24 hr capsule Take 75 mg by mouth daily.        Allergies  Allergen Reactions  . Morphine   . Penicillins   . Promethazine Hcl     CHANGE IN BEHAVIOR    Family History  Problem Relation Age of Onset  . Coronary artery disease Father   . Colon cancer Brother     History   Social History  . Marital Status: Married    Spouse Name: N/A    Number of Children: N/A  . Years of Education: N/A   Occupational History  . retired  Social History Main Topics  . Smoking status: Former Smoker -- 1.0 packs/day for 5 years    Types: Cigarettes    Quit date: 06/07/1968  . Smokeless tobacco: Never Used  . Alcohol Use: No  . Drug Use: No  . Sexually Active: Not on file   Other Topics Concern  . Not on file   Social History Narrative  . No narrative on file    ROS ALL NEGATIVE EXCEPT THOSE NOTED IN HPI  PE  General Appearance: well developed, well nourished in no acute distress HEENT: symmetrical face, PERRLA, good dentition  Neck: no JVD, thyromegaly, or adenopathy, trachea midline Chest: symmetric without deformity Cardiac: PMI non-displaced, RRR, normal S1, S2, no gallop or murmur Lung: clear to ausculation and percussion Vascular: all pulses full without bruits  Abdominal: nondistended, nontender, good bowel sounds, no HSM, no bruits Extremities: no cyanosis, clubbing or edema, no sign of DVT, no varicosities  Skin: normal color, no rashes Neuro: alert and oriented x 3,  non-focal Pysch: normal affect  EKG Not repeated. Pacer checked trans-telephonically recently. BMET    Component Value Date/Time   NA 138 06/16/2011 1435   K 4.1 06/16/2011 1435   CL 104 06/16/2011 1435   CO2 27 06/16/2011 1435   GLUCOSE 98 06/16/2011 1435   BUN 28* 06/16/2011 1435   CREATININE 1.1 06/16/2011 1435   CALCIUM 8.7 06/16/2011 1435   GFRNONAA 74* 03/25/2011 0405   GFRAA 85* 03/25/2011 0405    Lipid Panel     Component Value Date/Time   CHOL 98 03/22/2011 0345   TRIG 52 03/24/2011 0625   HDL 56 02/11/2009 0000   CHOLHDL 2.2 Ratio 02/11/2009 0000   VLDL 8 02/11/2009 0000   LDLCALC 59 02/11/2009 0000    CBC    Component Value Date/Time   WBC 7.8 06/16/2011 1435   RBC 3.86* 06/16/2011 1435   HGB 11.3* 06/16/2011 1435   HCT 34.1* 06/16/2011 1435   PLT 172.0 06/16/2011 1435   MCV 88.4 06/16/2011 1435   MCH 29.0 03/25/2011 0405   MCHC 33.0 06/16/2011 1435   RDW 17.2* 06/16/2011 1435   LYMPHSABS 2.1 06/16/2011 1435   MONOABS 0.9 06/16/2011 1435   EOSABS 0.2 06/16/2011 1435   BASOSABS 0.0 06/16/2011 1435

## 2011-11-03 NOTE — Assessment & Plan Note (Signed)
Carotid Doppler stable. Repeat in one year.

## 2011-11-03 NOTE — Patient Instructions (Signed)
Your physician has recommended you make the following change in your medication:  Stop taking Quinapril  Your physician wants you to follow-up in: 3 months with Dr. Daleen Squibb. You will receive a reminder letter in the mail two months in advance. If you don't receive a letter, please call our office to schedule the follow-up appointment.  You may stop your Xarelto 3 days prior to your spinal injection

## 2011-11-03 NOTE — Assessment & Plan Note (Signed)
Ultrasound tomorrow. We'll check on results.

## 2011-11-04 ENCOUNTER — Encounter (INDEPENDENT_AMBULATORY_CARE_PROVIDER_SITE_OTHER): Payer: Medicare Other

## 2011-11-04 DIAGNOSIS — I714 Abdominal aortic aneurysm, without rupture: Secondary | ICD-10-CM

## 2011-11-06 ENCOUNTER — Ambulatory Visit: Payer: Self-pay | Admitting: Unknown Physician Specialty

## 2011-11-19 ENCOUNTER — Encounter: Payer: Self-pay | Admitting: *Deleted

## 2011-11-23 ENCOUNTER — Telehealth: Payer: Self-pay | Admitting: Internal Medicine

## 2011-11-23 NOTE — Telephone Encounter (Signed)
I was called by the patient cardiologist, Dr. Daleen Squibb. I then called the patient's wife. Overnight he had nausea and felt unwell, perhaps some mild abdominal pain and distention. He subsequently had a bowel movement and feels better he has tolerated some water but has not yet taken his medications. The patient's wife was wondering if this represented another bowel obstruction. There seemed to be no other problems at this time. I advised trying liquids and taking his medications and gradually advancing the diet and to call back if that failed to progress or if there were other problems. She is comfortable with that plan.

## 2011-11-25 ENCOUNTER — Telehealth: Payer: Self-pay | Admitting: Internal Medicine

## 2011-11-25 NOTE — Telephone Encounter (Signed)
Spoke to wife  He is eating and having bowel movements though stools a little smaller  He does not have pain or fever and has not vomited.   Has some mild abdominal distetion.  I advised MiraLax x 2 doses today and stay on low residue diet.  We will call him or they will call us in AM tomorrow, sooner prn

## 2011-11-26 NOTE — Telephone Encounter (Signed)
Patient's wife reports that he is doing much better.  He is having BMs and a lot less pain and distention.  She will call back for any further questions or concerns

## 2011-12-26 ENCOUNTER — Emergency Department: Payer: Self-pay | Admitting: Emergency Medicine

## 2011-12-27 ENCOUNTER — Telehealth: Payer: Self-pay

## 2011-12-27 ENCOUNTER — Telehealth: Payer: Self-pay | Admitting: Cardiology

## 2011-12-27 NOTE — Telephone Encounter (Signed)
LMTCB

## 2011-12-27 NOTE — Telephone Encounter (Signed)
Message copied by Marcelle Overlie on Mon Dec 27, 2011  2:33 PM ------      Message from: Antonieta Iba      Created: Mon Dec 27, 2011 12:52 PM       Brailyn Delman      Can recall Mr. santilli to see how he feels.      He presented to [facility] emergency room on Sunday with shortness of breath. Workup there was essentially negative and he wanted to go home. I told him that we would call on Monday to confirm that he is doing better.             Can we asked if he needs to be seen or if he is back to baseline?      Thanks      [employer]

## 2011-12-27 NOTE — Telephone Encounter (Signed)
Pt's wife called to say pt is feeling much better. Denies sob.  Will call us should she fell he needs to be seen sooner.

## 2011-12-27 NOTE — Telephone Encounter (Signed)
I called and his wife. He is better today. He saw Dr. Jenne Campus who ordered some blood work today. He thought he looked pale. He did not stay at the emergency room long enough for them to do blood work or chest x-ray. In fact he refused.  I am going to make a house call tomorrow.

## 2011-12-28 ENCOUNTER — Ambulatory Visit: Payer: Self-pay | Admitting: Oncology

## 2011-12-29 ENCOUNTER — Encounter: Payer: Self-pay | Admitting: Cardiology

## 2011-12-29 ENCOUNTER — Telehealth: Payer: Self-pay

## 2011-12-29 NOTE — Telephone Encounter (Signed)
Notified patient's wife Mr. Mckinnon needs to have a CT of chest done tomorrow at [facility]; arrive at 9:45 am on December 30, 2011.

## 2011-12-29 NOTE — Telephone Encounter (Signed)
Harriett Sine, Production designer, theatre/television/film, also tried texting Dr. Daleen Squibb  I then discussed with Dr. Mariah Milling who advised to scan CXR document and route to Dr. Daleen Squibb for him to review.

## 2011-12-29 NOTE — Telephone Encounter (Signed)
Paged Dr Wall 

## 2011-12-29 NOTE — Telephone Encounter (Signed)
FYI  Please read below Scanning in CXR done 12/28/11

## 2011-12-29 NOTE — Telephone Encounter (Signed)
Dr. Daleen Squibb called me back.  He was made aware of abnormal CXR results and gave order for CT scan of chest without contrast tomorrow at [facility].  He asks that we call pt's wife with instructions.  He will go ahead and call her to inform her of plan as well.

## 2011-12-29 NOTE — Telephone Encounter (Signed)
I rec'd CXR results via fax from 7/23 This showed right lung base pneumonia I am not sure if Dr. Daleen Squibb is aware/has contacted pt I tried calling pt at all 3 #s listed and LMTCB  I also paged Dr. Daleen Squibb Awaiting call back.

## 2011-12-30 ENCOUNTER — Ambulatory Visit: Payer: Self-pay | Admitting: Cardiology

## 2011-12-30 ENCOUNTER — Other Ambulatory Visit: Payer: Self-pay

## 2011-12-30 DIAGNOSIS — R9389 Abnormal findings on diagnostic imaging of other specified body structures: Secondary | ICD-10-CM

## 2011-12-30 DIAGNOSIS — R0602 Shortness of breath: Secondary | ICD-10-CM

## 2011-12-31 ENCOUNTER — Telehealth: Payer: Self-pay | Admitting: Internal Medicine

## 2011-12-31 ENCOUNTER — Other Ambulatory Visit: Payer: Self-pay | Admitting: Cardiology

## 2011-12-31 DIAGNOSIS — R9389 Abnormal findings on diagnostic imaging of other specified body structures: Secondary | ICD-10-CM

## 2011-12-31 DIAGNOSIS — R0602 Shortness of breath: Secondary | ICD-10-CM

## 2011-12-31 NOTE — Telephone Encounter (Signed)
Forward 9 pages to Dr. Stan Head for review on 12-31-11 ym

## 2011-12-31 NOTE — Telephone Encounter (Signed)
Spoke with patient's wife and scheduled him on Wednesday at 9:15 AM.

## 2011-12-31 NOTE — Telephone Encounter (Signed)
Called Dr. Alcide Clever

## 2011-12-31 NOTE — Telephone Encounter (Signed)
Called Dr. Alcide Clever and Dr. Faye Ramsay office and requested records to be faxed.

## 2011-12-31 NOTE — Telephone Encounter (Signed)
Eric [name] has had Hgb in 6 range, heme + stool. He has been transfused 3 u RBC in [place].  He feels better.  No signs of hemorrhage.  I need to see him next week  Probably Thora Lance - please let his wife know I spoke to Dr. Daleen Squibb and arrange this.  I will need copies of labs and CT chest he had - believe most through Dr. Koleen Nimrod.but also Dr. Jenne Campus of ENT in [place].  Check to see if in Epic but doubt.  Thanks

## 2012-01-05 ENCOUNTER — Other Ambulatory Visit (INDEPENDENT_AMBULATORY_CARE_PROVIDER_SITE_OTHER): Payer: Medicare Other

## 2012-01-05 ENCOUNTER — Ambulatory Visit (INDEPENDENT_AMBULATORY_CARE_PROVIDER_SITE_OTHER): Payer: Medicare Other | Admitting: Internal Medicine

## 2012-01-05 ENCOUNTER — Encounter: Payer: Self-pay | Admitting: Internal Medicine

## 2012-01-05 VITALS — BP 134/80 | HR 72 | Ht 70.0 in | Wt 164.2 lb

## 2012-01-05 DIAGNOSIS — R195 Other fecal abnormalities: Secondary | ICD-10-CM

## 2012-01-05 DIAGNOSIS — D509 Iron deficiency anemia, unspecified: Secondary | ICD-10-CM

## 2012-01-05 LAB — CBC WITH DIFFERENTIAL/PLATELET
Basophils Relative: 0.3 % (ref 0.0–3.0)
Eosinophils Absolute: 0.1 10*3/uL (ref 0.0–0.7)
Lymphocytes Relative: 22.2 % (ref 12.0–46.0)
MCHC: 31 g/dL (ref 30.0–36.0)
Neutrophils Relative %: 64.1 % (ref 43.0–77.0)
Platelets: 175 10*3/uL (ref 150.0–400.0)
RBC: 3.82 Mil/uL — ABNORMAL LOW (ref 4.22–5.81)
WBC: 6.2 10*3/uL (ref 4.5–10.5)

## 2012-01-05 NOTE — Patient Instructions (Addendum)
You have been scheduled for an endoscopy at [facility]. Please follow written instructions given to you at your visit today. If you use inhalers (even only as needed), please bring them with you on the day of your procedure.  Continue to hold your Xarelto per Dr. Leone Payor.  Your physician has requested that you go to the basement for the following lab work before leaving today: CBC  Thank you for choosing me and [facility].  Iva Boop, M.D., [facility]

## 2012-01-05 NOTE — Progress Notes (Signed)
Quick Note:  Please let patient/wife know Hgb is 9.2. This is ok and I expect it should improve more after the iron infusion he had. I will see him at EGD next week ______

## 2012-01-05 NOTE — Progress Notes (Signed)
Subjective:    Patient ID: Eric [name], male    DOB: 09/26/1925, 78 y.o.   MRN: [id number]  HPI The patient is here with his wife because of a new anemia and heme positive stool. He is an elderly white man status post hemigastrectomy who had problems last year with ulcers in the gastric pouch and near the anastomosis. He also had bleeding with epistaxis. He was anemic related to that though his hemoglobin improved to the 11 range in January. He had been on iron but that was stopped. He was at the gym and a friend of his, and hearing nose and throat physician in [place] thought he looked pale. Hemoglobin was checked and it was 6.8. He was found to have Hemoccult positive stools but was not noted to have any melena or hematochezia and he and his wife agree with this. He was given a transfusion of 3 units of packed red cells. His ferritin was found to be 8 by Dr. Choski and he has been given a transfusion of iron. Xarelto was held. He also had a little bit of oral bleeding related to some dental procedures. In April, he had some bleeding in the oral cavity, or on the pillow and ENT evaluation did not turn out much of anything other than some prominent veins and dry mouth.Humidification was recommended with his CPAP. This was in April. He feels much better after the blood. Allergies  Allergen Reactions  . Morphine   . Penicillins   . Promethazine Hcl     CHANGE IN BEHAVIOR   Outpatient Prescriptions Prior to Visit  Medication Sig Dispense Refill  . aspirin 81 MG tablet Take 1 tablet (81 mg total) by mouth daily.      . docusate sodium (COLACE) 100 MG capsule Take 100 mg by mouth daily.        . finasteride (PROSCAR) 5 MG tablet TAKE 1 TABLET EVERY DAY  30 tablet  6  . furosemide (LASIX) 20 MG tablet Take 1 tablet (20 mg total) by mouth daily.  30 tablet  7  . KLOR-CON M20 20 MEQ tablet TAKE 1 TABLET EVERY DAY  30 each  11  . Multiple Vitamin (MULTIVITAMINS PO) Take 1 tablet by mouth daily.         . pantoprazole (PROTONIX) 40 MG tablet Take 1 tablet (40 mg total) by mouth daily.  30 tablet  6  . simvastatin (ZOCOR) 20 MG tablet Take 1 tablet (20 mg total) by mouth every evening.  30 tablet  11  . Tamsulosin HCl (FLOMAX) 0.4 MG CAPS Take 1 capsule (0.4 mg total) by mouth daily after supper.  30 capsule  2  . venlafaxine XR (EFFEXOR-XR) 37.5 MG 24 hr capsule TAKE ONE CAPSULE ONCE A DAY FOR 30 DAYS,THEN INCREASE TO 75MG.  30 capsule  6  . Rivaroxaban (XARELTO) 20 MG TABS Take 20 mg by mouth daily.  30 tablet  6   Past Medical History  Diagnosis Date  . BPH (benign prostatic hypertrophy)   . DJD (degenerative joint disease)   . Hx of transient ischemic attack (TIA)   . AAA (abdominal aortic aneurysm)   . Cerebrovascular disease   . HLD (hyperlipidemia)   . HTN (hypertension)   . Second degree AV block, Mobitz type II     s/p PPM  . CAD (coronary artery disease)   . Persistent atrial fibrillation     on pradaxa  . Sleep apnea     cpap   machine  . Gastric ulcer, acute 02/2011    multiple - NSAID/Pradaxa thought causative. associated gastroparesis-like problems  . Small bowel obstruction     multiple over the years   Past Surgical History  Procedure Date  . Pacemaker insertion 01/24/10    Adapta Medtronic   . Coronary artery bypass graft   . Appendectomy   . Gastrectomy   . Knee arthroscopy     right, with medial and lateral meniscal  . Finger surgery      Left middle finger dorsal lesion  . Upper gastrointestinal endoscopy 03/12/2011    stomach ulcer, esophageal erosion   History   Social History  . Marital Status: Married    Spouse Name: [name]    Number of Children: N/A  . Years of Education: N/A   Occupational History  . retired  textile mill owner, still visit in business daily    Social History Main Topics  . Smoking status: Former Smoker -- 1.0 packs/day for 5 years    Types: Cigarettes    Quit date: 06/07/1968  . Smokeless tobacco: Never Used  . Alcohol  Use: No  . Drug Use: No    Family History  Problem Relation Age of Onset  . Coronary artery disease Father   . Colon cancer Brother         Review of Systems As per history of present illness, he also had some dyspnea and heart failure symptoms prior to the blood transfusion. These have resolved the    Objective:   Physical Exam General:  NAD Eyes:   anicteric Lungs:  clear Heart:  S1S2 no rubs, murmurs or gallops Abdomen:  soft and nontender, BS+ Ext:   no edema    Data Reviewed:  ENT Notes, CBC 12/27/2011. BUN was 26 creatinine was 0.99. INR was 1.3. Dr. Choski's notes were reviewed as well, from [facility]. Ferritin was 8. CT of the chest had been performed on July 25 which demonstrated a small moderate right pleural effusion emphysema, bronchiectasis and his pacemaker and CABG changes.        Assessment & Plan:   1. Iron deficiency anemia, unspecified   Cause of this likely multifactorial. With his hemigastrectomy he will not absorb iron well. He walks a lot blood last year, he may not o'clock with his iron stores even though his hemoglobin had normalized or nearly normalized. I think he will need chronic iron supplementation either orally or IV. We'll determine that later. I think an upper endoscopy to look for persistent ulceration and cause of blood loss is appropriate. I am less enthusiastic about colonoscopy. The heme positive stool is of unclear significance though if an obvious cause is not found on the upper endoscopy, we'll need to discuss possible colonoscopy. The risks and benefits as well as alternatives of endoscopic procedure(s) have been discussed and reviewed. All questions answered. The patient agrees to proceed.  Repeat CBC today.   2. Heme positive stool   As above. We'll continue to hold the Xarelto. Await EGD. Further plans pending that. I need to track down his previous colonoscopy report.    I appreciate the opportunity to care for this  patient.   CC: Thomas Wall, MD, C. Mcqueen, MD, Janak K. Choski, MD   Lab Results  Component Value Date   WBC 6.2 01/05/2012   HGB 9.2* 01/05/2012   HCT 29.7* 01/05/2012   MCV 77.8* 01/05/2012   PLT 175.0 01/05/2012    

## 2012-01-06 ENCOUNTER — Ambulatory Visit: Payer: Self-pay | Admitting: Oncology

## 2012-01-11 ENCOUNTER — Telehealth: Payer: Self-pay | Admitting: Internal Medicine

## 2012-01-11 ENCOUNTER — Other Ambulatory Visit: Payer: Self-pay

## 2012-01-11 DIAGNOSIS — K625 Hemorrhage of anus and rectum: Secondary | ICD-10-CM

## 2012-01-11 NOTE — Telephone Encounter (Signed)
Please add a flex sig to his procedure - order an enema to be given at the hospital  Dx rectal bleeding

## 2012-01-11 NOTE — Telephone Encounter (Signed)
Flex has been added.  I have notified the patient's wife

## 2012-01-11 NOTE — Telephone Encounter (Signed)
Wife reports that the blood was around the stool.  A small amount.  Per wife the patient has no complaints otherwise.

## 2012-01-13 ENCOUNTER — Encounter (HOSPITAL_COMMUNITY)
Admission: RE | Disposition: A | Discharge status: Home / Self Care | Payer: Self-pay | Source: Ambulatory Visit | Attending: Internal Medicine

## 2012-01-13 ENCOUNTER — Encounter (HOSPITAL_COMMUNITY): Payer: Self-pay

## 2012-01-13 ENCOUNTER — Ambulatory Visit (HOSPITAL_COMMUNITY)
Admission: RE | Admit: 2012-01-13 | Discharge: 2012-01-13 | Disposition: A | Discharge status: Home / Self Care | Payer: Medicare Other | Source: Ambulatory Visit | Attending: Internal Medicine | Admitting: Internal Medicine

## 2012-01-13 DIAGNOSIS — D509 Iron deficiency anemia, unspecified: Secondary | ICD-10-CM | POA: Insufficient documentation

## 2012-01-13 DIAGNOSIS — K648 Other hemorrhoids: Secondary | ICD-10-CM | POA: Diagnosis present

## 2012-01-13 DIAGNOSIS — G4730 Sleep apnea, unspecified: Secondary | ICD-10-CM | POA: Insufficient documentation

## 2012-01-13 DIAGNOSIS — K2990 Gastroduodenitis, unspecified, without bleeding: Secondary | ICD-10-CM | POA: Insufficient documentation

## 2012-01-13 DIAGNOSIS — Z79899 Other long term (current) drug therapy: Secondary | ICD-10-CM | POA: Insufficient documentation

## 2012-01-13 DIAGNOSIS — I10 Essential (primary) hypertension: Secondary | ICD-10-CM | POA: Insufficient documentation

## 2012-01-13 DIAGNOSIS — R195 Other fecal abnormalities: Secondary | ICD-10-CM | POA: Insufficient documentation

## 2012-01-13 DIAGNOSIS — K625 Hemorrhage of anus and rectum: Secondary | ICD-10-CM | POA: Insufficient documentation

## 2012-01-13 DIAGNOSIS — K2970 Gastritis, unspecified, without bleeding: Secondary | ICD-10-CM | POA: Diagnosis present

## 2012-01-13 DIAGNOSIS — Z931 Gastrostomy status: Secondary | ICD-10-CM | POA: Insufficient documentation

## 2012-01-13 DIAGNOSIS — E785 Hyperlipidemia, unspecified: Secondary | ICD-10-CM | POA: Insufficient documentation

## 2012-01-13 HISTORY — PX: FLEXIBLE SIGMOIDOSCOPY: SHX5431

## 2012-01-13 HISTORY — PX: ESOPHAGOGASTRODUODENOSCOPY: SHX5428

## 2012-01-13 HISTORY — DX: Anemia, unspecified: D64.9

## 2012-01-13 SURGERY — EGD (ESOPHAGOGASTRODUODENOSCOPY)
Anesthesia: Moderate Sedation

## 2012-01-13 MED ORDER — FLEET ENEMA 7-19 GM/118ML RE ENEM
1.0000 | ENEMA | Freq: Once | RECTAL | Status: AC
  Administered 2012-01-13: 12:00:00 via RECTAL

## 2012-01-13 MED ORDER — SODIUM CHLORIDE 0.9 % IV SOLN
INTRAVENOUS | Status: DC
  Administered 2012-01-13: 12:00:00 via INTRAVENOUS

## 2012-01-13 MED ORDER — MIDAZOLAM HCL 10 MG/2ML IJ SOLN
INTRAMUSCULAR | Status: AC
  Filled 2012-01-13: qty 2

## 2012-01-13 MED ORDER — FENTANYL CITRATE 0.05 MG/ML IJ SOLN
INTRAMUSCULAR | Status: DC | PRN
  Administered 2012-01-13: 25 ug via INTRAVENOUS

## 2012-01-13 MED ORDER — BUTAMBEN-TETRACAINE-BENZOCAINE 2-2-14 % EX AERO
INHALATION_SPRAY | CUTANEOUS | Status: DC | PRN
  Administered 2012-01-13: 2 via TOPICAL

## 2012-01-13 MED ORDER — RIVAROXABAN 20 MG PO TABS: 20.0000 mg | ORAL_TABLET | Freq: Every day | ORAL | Status: DC

## 2012-01-13 MED ORDER — MIDAZOLAM HCL 10 MG/2ML IJ SOLN
INTRAMUSCULAR | Status: DC | PRN
  Administered 2012-01-13 (×2): 1 mg via INTRAVENOUS

## 2012-01-13 MED ORDER — FENTANYL CITRATE 0.05 MG/ML IJ SOLN
INTRAMUSCULAR | Status: AC
  Filled 2012-01-13: qty 2

## 2012-01-13 MED ORDER — FLEET ENEMA 7-19 GM/118ML RE ENEM
ENEMA | RECTAL | Status: AC
  Filled 2012-01-13: qty 1

## 2012-01-13 NOTE — Op Note (Signed)
[facility] [address] [place], [place]  [address]  FLEXIBLE SIGMOIDOSCOPY PROCEDURE REPORT  PATIENT:  Eric [name], Eric [name]  MR#:  [id number] BIRTHDATE:  10-17-1925, 86 yrs. old  GENDER:  male  ENDOSCOPIST:  Iva Boop, MD, [facility]  PROCEDURE DATE:  01/13/2012 PROCEDURE:  Flexible Sigmoidoscopy, diagnostic ASA CLASS:  Class III INDICATIONS:  rectal bleeding  MEDICATIONS:   There was residual sedation effect present from prior procedure.  DESCRIPTION OF PROCEDURE:   After the risks benefits and alternatives of the procedure were thoroughly explained, informed consent was obtained.  Digital rectal exam was performed and revealed no abnormalities.   The [id number] endoscope was introduced through the anus and advanced to the sigmoid colon, without limitations.  The quality of the prep was excellent.  The instrument was then slowly withdrawn as the mucosa was fully examined. <<PROCEDUREIMAGES>>  Internal hemorrhoids were found in the rectum.  The examination was otherwise normal.   Retroflexion was not performed.  The scope was then withdrawn from the patient and the procedure terminated.  COMPLICATIONS:  None  ENDOSCOPIC IMPRESSION: 1) Internal hemorrhoids in the rectum 2) Otherwise normal examination. RECOMMENDATIONS: Should be ok to resume Xaretlto and to get regular iron infusions. Heme + stool probably from hemorrhoids and iron-deficiency most likely due to impaired iron absorption after gastric resection.  REPEAT EXAM:  No  Iva Boop, MD, Clementeen Graham  CC:  Gaylord Shih, MD, J.Choksi MD, Learta Codding, MD and  The Patient  n. eSIGNED:   Iva Boop at 01/13/2012 01:14 PM  Cheral Almas, [phone]

## 2012-01-13 NOTE — Interval H&P Note (Signed)
History and Physical Interval Note:  01/13/2012 12:29 PM  Eric [name]  has presented today for surgery, with the diagnosis of Anemia [285.9]  He has also had rectal bleeding x 1 since i saw him in the office. The various methods of treatment have been discussed with the patient and family. After consideration of risks, benefits and other options for treatment, the patient has consented to  Procedure(s) (LRB): ESOPHAGOGASTRODUODENOSCOPY (EGD) (N/A) FLEXIBLE SIGMOIDOSCOPY (N/A) as a surgical intervention .  The patient's history has been reviewed, patient examined, no change in status, stable for surgery.  I have reviewed the patient's chart and labs.  Questions were answered to the patient's satisfaction.     Stan Head. MD. Clementeen Graham

## 2012-01-13 NOTE — Progress Notes (Signed)
Fleet enema given and tolerated enema well.  Ambulated to bathroom with assistance to expel enema.

## 2012-01-13 NOTE — Op Note (Signed)
[facility] [address] [place], [place]  [address]  ENDOSCOPY PROCEDURE REPORT  PATIENT:  Eric [name], Eric [name]  MR#:  [id number] BIRTHDATE:  02-18-1926, 86 yrs. old  GENDER:  male  ENDOSCOPIST:  Iva Boop, MD, [facility]  PROCEDURE DATE:  01/13/2012 PROCEDURE:  EGD, diagnostic [phone] ASA CLASS:  Class III INDICATIONS:  iron deficiency anemia, hemoccult positive stool  MEDICATIONS:   Fentanyl 25 mcg, Versed 2 mg IV TOPICAL ANESTHETIC:  Cetacaine Spray  DESCRIPTION OF PROCEDURE:   After the risks benefits and alternatives of the procedure were thoroughly explained, informed consent was obtained.  The Pentax Gastroscope [id number] endoscope was introduced through the mouth and advanced to the proximal jejunum, without limitations.  The instrument was slowly withdrawn as the mucosa was fully examined. <<PROCEDUREIMAGES>>  Post-operative change was noted with prior gastrojejunostomy. Moderate gastritis was found in the body of the stomach. Erythema and prominent folds.  Otherwise the examination was normal. Retroflexed views revealed no abnormalities.    The scope was then withdrawn from the patient and the procedure completed.  COMPLICATIONS:  None  ENDOSCOPIC IMPRESSION: 1) Post-operative change - gastro-jejunostomy 2) Moderate gastritis in the body of the stomach 3) Otherwise normal examination RECOMMENDATIONS: Sigmoidoscopy next.  Iva Boop, MD, Clementeen Graham  CC:  Gaylord Shih, MD, Levin Erp, MD, Learta Codding, MD and The Patient  n. eSIGNED:   Iva Boop at 01/13/2012 01:09 PM  Mitchell Heir, [phone]

## 2012-01-13 NOTE — H&P (View-Only) (Signed)
Subjective:    Patient ID: Eric [name], male    DOB: 01/22/1926, 78 y.o.   MRN: [id number]  HPI The patient is here with his wife because of a new anemia and heme positive stool. He is an elderly white man status post hemigastrectomy who had problems last year with ulcers in the gastric pouch and near the anastomosis. He also had bleeding with epistaxis. He was anemic related to that though his hemoglobin improved to the 11 range in January. He had been on iron but that was stopped. He was at the gym and a friend of his, and hearing nose and throat physician in [place] thought he looked pale. Hemoglobin was checked and it was 6.8. He was found to have Hemoccult positive stools but was not noted to have any melena or hematochezia and he and his wife agree with this. He was given a transfusion of 3 units of packed red cells. His ferritin was found to be 8 by Dr. Koleen Nimrod and he has been given a transfusion of iron. Xarelto was held. He also had a little bit of oral bleeding related to some dental procedures. In April, he had some bleeding in the oral cavity, or on the pillow and ENT evaluation did not turn out much of anything other than some prominent veins and dry mouth.Humidification was recommended with his CPAP. This was in April. He feels much better after the blood. Allergies  Allergen Reactions  . Morphine   . Penicillins   . Promethazine Hcl     CHANGE IN BEHAVIOR   Outpatient Prescriptions Prior to Visit  Medication Sig Dispense Refill  . aspirin 81 MG tablet Take 1 tablet (81 mg total) by mouth daily.      Marland Kitchen docusate sodium (COLACE) 100 MG capsule Take 100 mg by mouth daily.        . finasteride (PROSCAR) 5 MG tablet TAKE 1 TABLET EVERY DAY  30 tablet  6  . furosemide (LASIX) 20 MG tablet Take 1 tablet (20 mg total) by mouth daily.  30 tablet  7  . KLOR-CON M20 20 MEQ tablet TAKE 1 TABLET EVERY DAY  30 each  11  . Multiple Vitamin (MULTIVITAMINS PO) Take 1 tablet by mouth daily.         . pantoprazole (PROTONIX) 40 MG tablet Take 1 tablet (40 mg total) by mouth daily.  30 tablet  6  . simvastatin (ZOCOR) 20 MG tablet Take 1 tablet (20 mg total) by mouth every evening.  30 tablet  11  . Tamsulosin HCl (FLOMAX) 0.4 MG CAPS Take 1 capsule (0.4 mg total) by mouth daily after supper.  30 capsule  2  . venlafaxine XR (EFFEXOR-XR) 37.5 MG 24 hr capsule TAKE ONE CAPSULE ONCE A DAY FOR 30 DAYS,THEN INCREASE TO 75MG .  30 capsule  6  . Rivaroxaban (XARELTO) 20 MG TABS Take 20 mg by mouth daily.  30 tablet  6   Past Medical History  Diagnosis Date  . BPH (benign prostatic hypertrophy)   . DJD (degenerative joint disease)   . Hx of transient ischemic attack (TIA)   . AAA (abdominal aortic aneurysm)   . Cerebrovascular disease   . HLD (hyperlipidemia)   . HTN (hypertension)   . Second degree AV block, Mobitz type II     s/p PPM  . CAD (coronary artery disease)   . Persistent atrial fibrillation     on pradaxa  . Sleep apnea     cpap  machine  . Gastric ulcer, acute 02/2011    multiple - NSAID/Pradaxa thought causative. associated gastroparesis-like problems  . Small bowel obstruction     multiple over the years   Past Surgical History  Procedure Date  . Pacemaker insertion 01/24/10    Adapta Medtronic   . Coronary artery bypass graft   . Appendectomy   . Gastrectomy   . Knee arthroscopy     right, with medial and lateral meniscal  . Finger surgery      Left middle finger dorsal lesion  . Upper gastrointestinal endoscopy 03/12/2011    stomach ulcer, esophageal erosion   History   Social History  . Marital Status: Married    Spouse Name: [name]    Number of Children: N/A  . Years of Education: N/A   Occupational History  . retired  Clinical cytogeneticist, still visit in business daily    Social History Main Topics  . Smoking status: Former Smoker -- 1.0 packs/day for 5 years    Types: Cigarettes    Quit date: 06/07/1968  . Smokeless tobacco: Never Used  . Alcohol  Use: No  . Drug Use: No    Family History  Problem Relation Age of Onset  . Coronary artery disease Father   . Colon cancer Brother         Review of Systems As per history of present illness, he also had some dyspnea and heart failure symptoms prior to the blood transfusion. These have resolved the    Objective:   Physical Exam General:  NAD Eyes:   anicteric Lungs:  clear Heart:  S1S2 no rubs, murmurs or gallops Abdomen:  soft and nontender, BS+ Ext:   no edema    Data Reviewed:  ENT Notes, CBC 12/27/2011. BUN was 26 creatinine was 0.99. INR was 1.3. Dr. Alcide Clever notes were reviewed as well, from [facility]. Ferritin was 8. CT of the chest had been performed on July 25 which demonstrated a small moderate right pleural effusion emphysema, bronchiectasis and his pacemaker and CABG changes.        Assessment & Plan:   1. Iron deficiency anemia, unspecified   Cause of this likely multifactorial. With his hemigastrectomy he will not absorb iron well. He walks a lot blood last year, he may not o'clock with his iron stores even though his hemoglobin had normalized or nearly normalized. I think he will need chronic iron supplementation either orally or IV. We'll determine that later. I think an upper endoscopy to look for persistent ulceration and cause of blood loss is appropriate. I am less enthusiastic about colonoscopy. The heme positive stool is of unclear significance though if an obvious cause is not found on the upper endoscopy, we'll need to discuss possible colonoscopy. The risks and benefits as well as alternatives of endoscopic procedure(s) have been discussed and reviewed. All questions answered. The patient agrees to proceed.  Repeat CBC today.   2. Heme positive stool   As above. We'll continue to hold the Xarelto. Await EGD. Further plans pending that. I need to track down his previous colonoscopy report.    I appreciate the opportunity to care for this  patient.   CC: Valera Castle, MD, Learta Codding, MD, Gerome Sam. Choski, MD   Lab Results  Component Value Date   WBC 6.2 01/05/2012   HGB 9.2* 01/05/2012   HCT 29.7* 01/05/2012   MCV 77.8* 01/05/2012   PLT 175.0 01/05/2012

## 2012-01-14 ENCOUNTER — Encounter (HOSPITAL_COMMUNITY): Payer: Self-pay | Admitting: Internal Medicine

## 2012-01-24 ENCOUNTER — Encounter: Payer: Medicare Other | Admitting: Internal Medicine

## 2012-01-28 ENCOUNTER — Other Ambulatory Visit: Payer: Self-pay

## 2012-01-28 ENCOUNTER — Encounter: Payer: Self-pay | Admitting: Cardiovascular Disease

## 2012-01-28 ENCOUNTER — Ambulatory Visit (INDEPENDENT_AMBULATORY_CARE_PROVIDER_SITE_OTHER): Payer: Medicare Other | Admitting: Cardiovascular Disease

## 2012-01-28 ENCOUNTER — Other Ambulatory Visit: Payer: Self-pay | Admitting: Cardiovascular Disease

## 2012-01-28 ENCOUNTER — Telehealth: Payer: Self-pay | Admitting: Cardiovascular Disease

## 2012-01-28 VITALS — BP 132/80 | HR 60 | Ht 70.5 in | Wt 171.2 lb

## 2012-01-28 DIAGNOSIS — I5032 Chronic diastolic (congestive) heart failure: Secondary | ICD-10-CM

## 2012-01-28 DIAGNOSIS — R0602 Shortness of breath: Secondary | ICD-10-CM

## 2012-01-28 DIAGNOSIS — I4891 Unspecified atrial fibrillation: Secondary | ICD-10-CM

## 2012-01-28 DIAGNOSIS — I25810 Atherosclerosis of coronary artery bypass graft(s) without angina pectoris: Secondary | ICD-10-CM

## 2012-01-28 DIAGNOSIS — R231 Pallor: Secondary | ICD-10-CM

## 2012-01-28 DIAGNOSIS — D509 Iron deficiency anemia, unspecified: Secondary | ICD-10-CM

## 2012-01-28 NOTE — Telephone Encounter (Signed)
Dr. Mariah Milling wants pt seen this pm at 3:30/3:45 by either Dr. Oleta Mouse.  I tried reaching wife at work but was unable to reach her.  I will try to reach them at [facility] lab when he gets there for stat CBC.

## 2012-01-28 NOTE — Telephone Encounter (Signed)
Pt's wife called back.  She says her nephew called her at work. Says pt was walking down hall and looks pale and is sob. Wife asks if Dr. Mariah Milling could "listen" to pt to make sure he is not accumulating fluid.  I explained, with pt's recent hx of GI bleed, it may be r/t anemia/bleed. She confirms he did have dark stool this am but is also on iron.  She is unsure as to which is causing dark stools.  I had her hold while I spoke with Dr. Mariah Milling.

## 2012-01-28 NOTE — Patient Instructions (Addendum)
Your physician has requested that you have an echocardiogram. Echocardiography is a painless test that uses sound waves to create images of your heart. It provides your doctor with information about the size and shape of your heart and how well your heart's chambers and valves are working. This procedure takes approximately one hour. There are no restrictions for this procedure.  Keep follow up with Dr. Daleen Squibb.

## 2012-01-28 NOTE — Telephone Encounter (Signed)
Patient's wife called and said Dr. Daleen Squibb told her to call and see if Eric [name] could be seen by Dr. Mariah Milling today.  He is pale and having increased shortness of breath.  Please call her back at work [phone]

## 2012-01-28 NOTE — Telephone Encounter (Signed)
I spoke with wife, told her we are willing to see pt at 3:30. She says she will keep appt but will text Dr. Daleen Squibb first to see what he wants pt to do. She states, "I just want someone to listen to his lungs". I explained we will do that at appt, but we do not even have a doctor in office until 2 pm. She says she will call Dr. Daleen Squibb and will call us back if he is unable to keep appt.

## 2012-01-28 NOTE — Telephone Encounter (Signed)
Wife called to confirm they are coming today at 3:30 pm

## 2012-01-28 NOTE — Telephone Encounter (Signed)
I discussed with Dr. Mariah Milling, who suggests pt come in for stat CBC.  Based on these results, we will go from there.   I advised wife to have pt to go [facility] for stat CBC and wait for [place] to call with results. Understanding verb.

## 2012-01-30 NOTE — Assessment & Plan Note (Signed)
This seems to be permanent. His CHADS 2 score is very high and thus there is a concern about stopping anticoagulation in the setting of ongoing anemia. It's probably easier to treat the effect of anemia than dealing with a disabling stroke. Thus, Xarelto will be continued after discussion with Dr. wall.

## 2012-01-30 NOTE — Progress Notes (Signed)
HPI  This is a pleasant 78 year old male who is followed closely by Dr. wall for complex cardiac and medical problems which are listed below. I saw him today on an urgent basis upon the request of Dr. wall. The patient was noted to be pale by family members and was complaining of increased fatigue as well as dyspnea. He has been having problems with anemia without obvious source of bleeding. He underwent extensive GI evaluation. He is followed at the cancer Center with regular iron injections. His hemoglobin last month was 9.2. It was checked stat today and it came back 8.4. He denies any blood in the stool or dark stool. He reports no chest pain, orthopnea or PND. He does endorse increased dyspnea and lower extremity edema.  Allergies  Allergen Reactions  . Morphine   . Penicillins   . Promethazine Hcl     CHANGE IN BEHAVIOR     Current Outpatient Prescriptions on File Prior to Visit  Medication Sig Dispense Refill  . aspirin 81 MG tablet Take 1 tablet (81 mg total) by mouth daily.      Marland Kitchen docusate sodium (COLACE) 100 MG capsule Take 100 mg by mouth daily.        . finasteride (PROSCAR) 5 MG tablet TAKE 1 TABLET EVERY DAY  30 tablet  6  . furosemide (LASIX) 20 MG tablet Take 1 tablet (20 mg total) by mouth daily.  30 tablet  7  . KLOR-CON M20 20 MEQ tablet TAKE 1 TABLET EVERY DAY  30 each  11  . Multiple Vitamin (MULTIVITAMINS PO) Take 1 tablet by mouth daily.        . pantoprazole (PROTONIX) 40 MG tablet Take 1 tablet (40 mg total) by mouth daily.  30 tablet  6  . Rivaroxaban (XARELTO) 20 MG TABS Take 1 tablet (20 mg total) by mouth daily. CONTINUE TO HOLD THIS MEDICATION UNTIL YOU HEAR FROM DR. WALL  30 tablet  6  . simvastatin (ZOCOR) 20 MG tablet Take 1 tablet (20 mg total) by mouth every evening.  30 tablet  11  . Tamsulosin HCl (FLOMAX) 0.4 MG CAPS Take 1 capsule (0.4 mg total) by mouth daily after supper.  30 capsule  2  . venlafaxine XR (EFFEXOR-XR) 37.5 MG 24 hr capsule TAKE  ONE CAPSULE ONCE A DAY FOR 30 DAYS,THEN INCREASE TO 75MG .  30 capsule  6     Past Medical History  Diagnosis Date  . BPH (benign prostatic hypertrophy)   . DJD (degenerative joint disease)   . Hx of transient ischemic attack (TIA)   . AAA (abdominal aortic aneurysm)   . Cerebrovascular disease   . HLD (hyperlipidemia)   . HTN (hypertension)   . Second degree AV block, Mobitz type II     s/p PPM  . CAD (coronary artery disease)   . Persistent atrial fibrillation     on pradaxa  . Sleep apnea     cpap machine  . Gastric ulcer, acute 02/2011    multiple - NSAID/Pradaxa thought causative. associated gastroparesis-like problems  . Small bowel obstruction     multiple over the years  . Anemia   . Anemia      Past Surgical History  Procedure Date  . Pacemaker insertion 01/24/10    Adapta Medtronic   . Coronary artery bypass graft   . Appendectomy   . Gastrectomy   . Knee arthroscopy     right, with medial and lateral meniscal  .  Finger surgery      Left middle finger dorsal lesion  . Upper gastrointestinal endoscopy 03/12/2011    stomach ulcer, esophageal erosion  . Esophagogastroduodenoscopy 01/13/2012    Procedure: ESOPHAGOGASTRODUODENOSCOPY (EGD);  Surgeon: Iva Boop, MD;  Location: [facility];  Service: Endoscopy;  Laterality: N/A;  . Flexible sigmoidoscopy 01/13/2012    Procedure: FLEXIBLE SIGMOIDOSCOPY;  Surgeon: Iva Boop, MD;  Location: [facility];  Service: Endoscopy;  Laterality: N/A;  Fleets enema on arrival     Family History  Problem Relation Age of Onset  . Coronary artery disease Father   . Colon cancer Brother      History   Social History  . Marital Status: Married    Spouse Name: N/A    Number of Children: N/A  . Years of Education: N/A   Occupational History  . retired    Social History Main Topics  . Smoking status: Former Smoker -- 1.0 packs/day for 5 years    Types: Cigarettes    Quit date: 06/07/1968  . Smokeless tobacco:  Never Used  . Alcohol Use: No  . Drug Use: No  . Sexually Active: Not on file   Other Topics Concern  . Not on file   Social History Narrative  . No narrative on file     PHYSICAL EXAM   BP 132/80  Pulse 60  Ht 5' 10.5" (1.791 m)  Wt 171 lb 4 oz (77.678 kg)  BMI 24.22 kg/m2 Constitutional: He is oriented to person, place, and time. He appears well-developed and well-nourished. No distress.  HENT: No nasal discharge.  Head: Normocephalic and atraumatic.  Eyes: Pupils are equal and round. Right eye exhibits no discharge. Left eye exhibits no discharge.  Neck: Normal range of motion. Neck supple. No JVD present. No thyromegaly present.  Cardiovascular: Normal rate, regular rhythm, normal heart sounds and. Exam reveals no gallop and no friction rub. No murmur heard.  Pulmonary/Chest: Effort normal and breath sounds normal. No stridor. No respiratory distress. He has no wheezes. He has no rales. He exhibits no tenderness.  Abdominal: Soft. Bowel sounds are normal. He exhibits no distension. There is no tenderness. There is no rebound and no guarding.  Musculoskeletal: Normal range of motion. He exhibits +1 edema and no tenderness.  Neurological: He is alert and oriented to person, place, and time. Coordination normal.  Skin: Skin is warm and dry. No rash noted. He is not diaphoretic. No erythema. No pallor.  Psychiatric: He has a normal mood and affect. His behavior is normal. Judgment and thought content normal.       EKG: Atrial fibrillation with ventricular paced rhythm.   ASSESSMENT AND PLAN

## 2012-01-30 NOTE — Assessment & Plan Note (Signed)
The patient reports increased dyspnea and lower extremity edema. I suspect that the anemia is contributing to this. I will request an echocardiogram for evaluation given that he had not had one in a while.

## 2012-01-30 NOTE — Assessment & Plan Note (Signed)
The patient might need further evaluation of this. Dr. Daleen Squibb will arrange for followup at the cancer Center regarding this soon.

## 2012-01-30 NOTE — Assessment & Plan Note (Signed)
He has no clear symptoms of angina. Continue medical therapy. 

## 2012-02-01 ENCOUNTER — Encounter: Payer: Self-pay | Admitting: Cardiology

## 2012-02-01 ENCOUNTER — Ambulatory Visit (INDEPENDENT_AMBULATORY_CARE_PROVIDER_SITE_OTHER): Payer: Medicare Other | Admitting: Cardiology

## 2012-02-01 ENCOUNTER — Other Ambulatory Visit: Payer: Self-pay | Admitting: Internal Medicine

## 2012-02-01 VITALS — BP 122/72 | HR 66 | Ht 70.0 in | Wt 168.0 lb

## 2012-02-01 DIAGNOSIS — I4891 Unspecified atrial fibrillation: Secondary | ICD-10-CM

## 2012-02-01 DIAGNOSIS — Z950 Presence of cardiac pacemaker: Secondary | ICD-10-CM

## 2012-02-01 LAB — PACEMAKER DEVICE OBSERVATION
BAMS-0001: 150 {beats}/min
BRDY-0002RV: 60 {beats}/min
BRDY-0003RV: 130 {beats}/min
VENTRICULAR PACING PM: 95

## 2012-02-01 NOTE — Patient Instructions (Addendum)
Remote monitoring is used to monitor your Pacemaker of ICD from home. This monitoring reduces the number of office visits required to check your device to one time per year. It allows us to keep an eye on the functioning of your device to ensure it is working properly. You are scheduled for a device check from home on May 08, 2012. You may send your transmission at any time that day. If you have a wireless device, the transmission will be sent automatically. After your physician reviews your transmission, you will receive a postcard with your next transmission date.  Your physician wants you to follow-up in: 1 year with Dr Allred.  You will receive a reminder letter in the mail two months in advance. If you don't receive a letter, please call our office to schedule the follow-up appointment.  

## 2012-02-01 NOTE — Progress Notes (Signed)
ELECTROPHYSIOLOGY OFFICE NOTE  Patient ID: Eric [name] MRN: [id number], DOB/AGE: January 16, 1926   Date of Visit: 02/01/2012  Primary Cardiologist: Valera Castle, MD Primary EP: Hillis Range, MD Reason for Visit: Device follow-up  History of Present Illness Eric [name] is an 78 year old gentleman with Mobitz II AV block s/p PPM, permanent AFib, CAD, HTN and dyslipidemia who presents today for device follow-up. He reports he is feeling well, like his usual self, and has no complaints. He denies CP, SOB, palpitations, dizziness, near syncope or syncope.  Past Medical History  Diagnosis Date  . BPH (benign prostatic hypertrophy)   . DJD (degenerative joint disease)   . Hx of transient ischemic attack (TIA)   . AAA (abdominal aortic aneurysm)   . Cerebrovascular disease   . HLD (hyperlipidemia)   . HTN (hypertension)   . Second degree AV block, Mobitz type II     s/p PPM  . CAD (coronary artery disease)   . Persistent atrial fibrillation     on pradaxa  . Sleep apnea     cpap machine  . Gastric ulcer, acute 02/2011    multiple - NSAID/Pradaxa thought causative. associated gastroparesis-like problems  . Small bowel obstruction     multiple over the years  . Anemia   . Anemia     Past Surgical History  Procedure Date  . Pacemaker insertion 01/24/10    Adapta Medtronic   . Coronary artery bypass graft   . Appendectomy   . Gastrectomy   . Knee arthroscopy     right, with medial and lateral meniscal  . Finger surgery      Left middle finger dorsal lesion  . Upper gastrointestinal endoscopy 03/12/2011    stomach ulcer, esophageal erosion  . Esophagogastroduodenoscopy 01/13/2012    Procedure: ESOPHAGOGASTRODUODENOSCOPY (EGD);  Surgeon: Iva Boop, MD;  Location: [facility];  Service: Endoscopy;  Laterality: N/A;  . Flexible sigmoidoscopy 01/13/2012    Procedure: FLEXIBLE SIGMOIDOSCOPY;  Surgeon: Iva Boop, MD;  Location: [facility];  Service: Endoscopy;  Laterality: N/A;   Fleets enema on arrival     Allergies/Intolerances Allergies  Allergen Reactions  . Morphine   . Penicillins   . Promethazine Hcl     CHANGE IN BEHAVIOR    Current Home Medications Current Outpatient Prescriptions  Medication Sig Dispense Refill  . aspirin 81 MG tablet Take 1 tablet (81 mg total) by mouth daily.      Marland Kitchen docusate sodium (COLACE) 100 MG capsule Take 100 mg by mouth daily.        . finasteride (PROSCAR) 5 MG tablet TAKE 1 TABLET EVERY DAY  30 tablet  6  . furosemide (LASIX) 20 MG tablet Take 1 tablet (20 mg total) by mouth daily.  30 tablet  7  . KLOR-CON M20 20 MEQ tablet TAKE 1 TABLET EVERY DAY  30 each  11  . Multiple Vitamin (MULTIVITAMINS PO) Take 1 tablet by mouth daily.        . pantoprazole (PROTONIX) 40 MG tablet Take 1 tablet (40 mg total) by mouth daily.  30 tablet  6  . Rivaroxaban (XARELTO) 20 MG TABS Take 1 tablet (20 mg total) by mouth daily. CONTINUE TO HOLD THIS MEDICATION UNTIL YOU HEAR FROM DR. WALL  30 tablet  6  . simvastatin (ZOCOR) 20 MG tablet Take 1 tablet (20 mg total) by mouth every evening.  30 tablet  11  . Tamsulosin HCl (FLOMAX) 0.4 MG CAPS Take 1 capsule (  0.4 mg total) by mouth daily after supper.  30 capsule  2  . traMADol (ULTRAM) 50 MG tablet as needed.      . venlafaxine XR (EFFEXOR-XR) 37.5 MG 24 hr capsule TAKE ONE CAPSULE ONCE A DAY FOR 30 DAYS,THEN INCREASE TO 75MG .  30 capsule  6    Social History Social History  . Marital Status: Married   Occupational History  . retired    Social History Main Topics  . Smoking status: Former Smoker -- 1.0 packs/day for 5 years    Types: Cigarettes    Quit date: 06/07/1968  . Smokeless tobacco: Never Used  . Alcohol Use: No  . Drug Use: No   Review of Systems General: No chills, fever, night sweats or weight changes Cardiovascular: No chest pain, dyspnea on exertion, edema, orthopnea, palpitations, paroxysmal nocturnal dyspnea Dermatological: No rash, lesions or masses Respiratory:  No cough, dyspnea Urologic: No hematuria, dysuria Abdominal: No nausea, vomiting, diarrhea, bright red blood per rectum, melena, or hematemesis Neurologic: No visual changes, weakness, changes in mental status All other systems reviewed and are otherwise negative except as noted above.  Physical Exam Blood pressure 122/72, pulse 66, height 5\' 10"  (1.778 m), weight 168 lb (76.204 kg), SpO2 94.00%.  General: Well developed, well appearing, elderly 78 year old male in no acute distress. HEENT: Normocephalic, atraumatic. EOMs intact. Sclera nonicteric. Oropharynx clear.  Neck: Supple. No JVD. Lungs:  Respirations regular and unlabored, CTA bilaterally. No wheezes, rales or rhonchi. Heart: S1, S2 present. No murmurs, rub, S3 or S4. Abdomen: Soft, non-distended.  Extremities: No clubbing, cyanosis or edema.  Psych: Normal affect. Neuro: Alert and oriented X 3. Moves all extremities spontaneously.   Diagnostics Device interrogation shows normal PPM function with good battery status and stable lead measurements/parameters; patient is in permanent AFib; reprogrammed mode to VVIR; no other programming changes made; see PaceArt report  Assessment and Plan 1. Mobitz II AV block s/p PPM - device function normal; changed mode to VVIR as patient is in permanent AFib; no other programming changes made; continue routine remote device checks every 3 months; follow-up with Dr. Johney Frame in one year unless needed sooner 2. Atrial fibrillation - permanent, stable; reprogrammed pacemaker mode to VVIR; continue Xarelto for embolic prophylaxis  Eric [name] expressed verbal understanding and agrees with this plan of care. Signed, Rick Duff, PA-C 02/01/2012, 2:04 PM

## 2012-02-06 ENCOUNTER — Ambulatory Visit: Payer: Self-pay | Admitting: Oncology

## 2012-02-08 ENCOUNTER — Encounter: Payer: Self-pay | Admitting: Cardiology

## 2012-02-08 ENCOUNTER — Ambulatory Visit (INDEPENDENT_AMBULATORY_CARE_PROVIDER_SITE_OTHER): Payer: Medicare Other | Admitting: Cardiology

## 2012-02-08 VITALS — BP 142/80 | HR 65 | Ht 70.0 in | Wt 167.0 lb

## 2012-02-08 DIAGNOSIS — I4891 Unspecified atrial fibrillation: Secondary | ICD-10-CM

## 2012-02-08 DIAGNOSIS — I714 Abdominal aortic aneurysm, without rupture, unspecified: Secondary | ICD-10-CM

## 2012-02-08 DIAGNOSIS — I5032 Chronic diastolic (congestive) heart failure: Secondary | ICD-10-CM

## 2012-02-08 DIAGNOSIS — E785 Hyperlipidemia, unspecified: Secondary | ICD-10-CM

## 2012-02-08 DIAGNOSIS — D509 Iron deficiency anemia, unspecified: Secondary | ICD-10-CM

## 2012-02-08 DIAGNOSIS — I10 Essential (primary) hypertension: Secondary | ICD-10-CM

## 2012-02-08 DIAGNOSIS — I25810 Atherosclerosis of coronary artery bypass graft(s) without angina pectoris: Secondary | ICD-10-CM

## 2012-02-08 DIAGNOSIS — Z950 Presence of cardiac pacemaker: Secondary | ICD-10-CM

## 2012-02-08 DIAGNOSIS — G4733 Obstructive sleep apnea (adult) (pediatric): Secondary | ICD-10-CM

## 2012-02-08 DIAGNOSIS — I452 Bifascicular block: Secondary | ICD-10-CM

## 2012-02-08 DIAGNOSIS — Z7901 Long term (current) use of anticoagulants: Secondary | ICD-10-CM

## 2012-02-08 NOTE — Progress Notes (Signed)
HPI Eric [name] returns today for close followup. He has had increased shortness of breath and some edema recently. His hemoglobin was down in the low eights. I had him seen by Dr.Muhammed in my absence. Please see his note.  He and I discussed this and felt like he was really more related to his anemia. He has been receiving transfusions when necessary at [facility] with Dr Koleen Nimrod. He is also receiving iron transfusions. Because of concern of possible occult bleeding, he had endoscopy and colonoscopy by Dr. Leone Payor. These were negative except for some hemorrhoids.  He received a unit of blood last week and feels remarkably better. He is following up with them on Friday.  He denies orthopnea, PND or change in his weight. His appetite is good.  Past Medical History  Diagnosis Date  . BPH (benign prostatic hypertrophy)   . DJD (degenerative joint disease)   . Hx of transient ischemic attack (TIA)   . AAA (abdominal aortic aneurysm)   . Cerebrovascular disease   . HLD (hyperlipidemia)   . HTN (hypertension)   . Second degree AV block, Mobitz type II     s/p PPM  . CAD (coronary artery disease)   . Persistent atrial fibrillation     on pradaxa  . Sleep apnea     cpap machine  . Gastric ulcer, acute 02/2011    multiple - NSAID/Pradaxa thought causative. associated gastroparesis-like problems  . Small bowel obstruction     multiple over the years  . Anemia   . Anemia     Current Outpatient Prescriptions  Medication Sig Dispense Refill  . aspirin 81 MG tablet Take 1 tablet (81 mg total) by mouth daily.      Marland Kitchen docusate sodium (COLACE) 100 MG capsule Take 100 mg by mouth daily.        . finasteride (PROSCAR) 5 MG tablet TAKE 1 TABLET EVERY DAY  30 tablet  6  . furosemide (LASIX) 20 MG tablet Take 1 tablet (20 mg total) by mouth daily.  30 tablet  7  . KLOR-CON M20 20 MEQ tablet TAKE 1 TABLET EVERY DAY  30 each  11  . Multiple Vitamin (MULTIVITAMINS PO) Take 1 tablet by mouth daily.         . pantoprazole (PROTONIX) 40 MG tablet Take 1 tablet (40 mg total) by mouth daily.  30 tablet  6  . Rivaroxaban (XARELTO) 20 MG TABS Take 1 tablet (20 mg total) by mouth daily. CONTINUE TO HOLD THIS MEDICATION UNTIL YOU HEAR FROM DR. Latrell Reitan  30 tablet  6  . simvastatin (ZOCOR) 20 MG tablet Take 1 tablet (20 mg total) by mouth every evening.  30 tablet  11  . Tamsulosin HCl (FLOMAX) 0.4 MG CAPS Take 1 capsule (0.4 mg total) by mouth daily after supper.  30 capsule  2  . traMADol (ULTRAM) 50 MG tablet as needed.      . venlafaxine XR (EFFEXOR-XR) 37.5 MG 24 hr capsule TAKE ONE CAPSULE ONCE A DAY FOR 30 DAYS,THEN INCREASE TO 75MG .  30 capsule  6    Allergies  Allergen Reactions  . Morphine   . Penicillins   . Promethazine Hcl     CHANGE IN BEHAVIOR    Family History  Problem Relation Age of Onset  . Coronary artery disease Father   . Colon cancer Brother     History   Social History  . Marital Status: Married    Spouse Name: N/A  Number of Children: N/A  . Years of Education: N/A   Occupational History  . retired    Social History Main Topics  . Smoking status: Former Smoker -- 1.0 packs/day for 5 years    Types: Cigarettes    Quit date: 06/07/1968  . Smokeless tobacco: Never Used  . Alcohol Use: No  . Drug Use: No  . Sexually Active: Not on file   Other Topics Concern  . Not on file   Social History Narrative  . No narrative on file    ROS ALL NEGATIVE EXCEPT THOSE NOTED IN HPI  PE  General Appearance: well developed, well nourished in no acute distress, frail HEENT: symmetrical face, PERRLA, good dentition  Neck: no JVD, thyromegaly, or adenopathy, trachea midline Chest: symmetric without deformity Cardiac: PMI non-displaced, RRR, normal S1, S2, no gallop or murmur Lung: clear to ausculation and percussion Vascular: all pulses full without bruits  Abdominal: nondistended, nontender, good bowel sounds, no HSM, no bruits Extremities: no cyanosis,  clubbing or edema, no sign of DVT, no varicosities  Skin: normal color, no rashes, pale Neuro: alert and oriented x 3, non-focal Pysch: normal affect  EKG  BMET    Component Value Date/Time   NA 138 06/16/2011 1435   K 4.1 06/16/2011 1435   CL 104 06/16/2011 1435   CO2 27 06/16/2011 1435   GLUCOSE 98 06/16/2011 1435   BUN 28* 06/16/2011 1435   CREATININE 1.1 06/16/2011 1435   CALCIUM 8.7 06/16/2011 1435   GFRNONAA 74* 03/25/2011 0405   GFRAA 85* 03/25/2011 0405    Lipid Panel     Component Value Date/Time   CHOL 98 03/22/2011 0345   TRIG 52 03/24/2011 0625   HDL 56 02/11/2009 0000   CHOLHDL 2.2 Ratio 02/11/2009 0000   VLDL 8 02/11/2009 0000   LDLCALC 59 02/11/2009 0000    CBC    Component Value Date/Time   WBC 6.2 01/05/2012 1032   RBC 3.82* 01/05/2012 1032   HGB 9.2* 01/05/2012 1032   HCT 29.7* 01/05/2012 1032   PLT 175.0 01/05/2012 1032   MCV 77.8* 01/05/2012 1032   MCH 29.0 03/25/2011 0405   MCHC 31.0 01/05/2012 1032   RDW 21.7* 01/05/2012 1032   LYMPHSABS 1.4 01/05/2012 1032   MONOABS 0.7 01/05/2012 1032   EOSABS 0.1 01/05/2012 1032   BASOSABS 0.0 01/05/2012 1032

## 2012-02-08 NOTE — Assessment & Plan Note (Signed)
With his high risk of thromboembolic events, we'll continue his anticoagulation. He has some significant dental work being done once again. Because of perioperative bleeding in the past, we'll hold Xarelto for 2 days prior and 2 days after. The stress of losing more blood in the future is too great to risk being more aggressive starting it back. He and his wife understand this.

## 2012-02-08 NOTE — Assessment & Plan Note (Signed)
Stable. No change in medical therapy. 

## 2012-02-08 NOTE — Patient Instructions (Signed)
Return to see Dr.Wall in 3 month.

## 2012-02-09 ENCOUNTER — Other Ambulatory Visit: Payer: Medicare Other

## 2012-02-09 ENCOUNTER — Encounter: Payer: Self-pay | Admitting: Internal Medicine

## 2012-02-21 ENCOUNTER — Other Ambulatory Visit: Payer: Medicare Other

## 2012-03-06 ENCOUNTER — Other Ambulatory Visit: Payer: Self-pay | Admitting: Cardiology

## 2012-03-07 ENCOUNTER — Ambulatory Visit: Payer: Self-pay | Admitting: Oncology

## 2012-03-08 ENCOUNTER — Inpatient Hospital Stay (HOSPITAL_COMMUNITY)
Admission: EM | Admit: 2012-03-08 | Discharge: 2012-03-14 | DRG: 086 | Disposition: A | Discharge status: Rehabilitation Facility | Payer: Medicare Other | Source: Other Acute Inpatient Hospital | Attending: Critical Care Medicine | Admitting: Critical Care Medicine

## 2012-03-08 ENCOUNTER — Inpatient Hospital Stay (HOSPITAL_COMMUNITY): Payer: Medicare Other

## 2012-03-08 ENCOUNTER — Encounter (HOSPITAL_COMMUNITY): Payer: Self-pay | Admitting: Critical Care Medicine

## 2012-03-08 ENCOUNTER — Other Ambulatory Visit: Payer: Self-pay

## 2012-03-08 ENCOUNTER — Emergency Department: Payer: Self-pay | Admitting: Unknown Physician Specialty

## 2012-03-08 DIAGNOSIS — Z87891 Personal history of nicotine dependence: Secondary | ICD-10-CM

## 2012-03-08 DIAGNOSIS — I25810 Atherosclerosis of coronary artery bypass graft(s) without angina pectoris: Secondary | ICD-10-CM

## 2012-03-08 DIAGNOSIS — K912 Postsurgical malabsorption, not elsewhere classified: Secondary | ICD-10-CM | POA: Diagnosis present

## 2012-03-08 DIAGNOSIS — R634 Abnormal weight loss: Secondary | ICD-10-CM | POA: Diagnosis present

## 2012-03-08 DIAGNOSIS — F3289 Other specified depressive episodes: Secondary | ICD-10-CM | POA: Diagnosis present

## 2012-03-08 DIAGNOSIS — I679 Cerebrovascular disease, unspecified: Secondary | ICD-10-CM

## 2012-03-08 DIAGNOSIS — R791 Abnormal coagulation profile: Secondary | ICD-10-CM | POA: Diagnosis present

## 2012-03-08 DIAGNOSIS — I714 Abdominal aortic aneurysm, without rupture, unspecified: Secondary | ICD-10-CM

## 2012-03-08 DIAGNOSIS — Z23 Encounter for immunization: Secondary | ICD-10-CM

## 2012-03-08 DIAGNOSIS — Z87898 Personal history of other specified conditions: Secondary | ICD-10-CM

## 2012-03-08 DIAGNOSIS — Z8679 Personal history of other diseases of the circulatory system: Secondary | ICD-10-CM

## 2012-03-08 DIAGNOSIS — Z950 Presence of cardiac pacemaker: Secondary | ICD-10-CM

## 2012-03-08 DIAGNOSIS — R911 Solitary pulmonary nodule: Secondary | ICD-10-CM

## 2012-03-08 DIAGNOSIS — I452 Bifascicular block: Secondary | ICD-10-CM

## 2012-03-08 DIAGNOSIS — Z951 Presence of aortocoronary bypass graft: Secondary | ICD-10-CM

## 2012-03-08 DIAGNOSIS — I441 Atrioventricular block, second degree: Secondary | ICD-10-CM

## 2012-03-08 DIAGNOSIS — Z8673 Personal history of transient ischemic attack (TIA), and cerebral infarction without residual deficits: Secondary | ICD-10-CM

## 2012-03-08 DIAGNOSIS — W1809XA Striking against other object with subsequent fall, initial encounter: Secondary | ICD-10-CM | POA: Diagnosis present

## 2012-03-08 DIAGNOSIS — Z7901 Long term (current) use of anticoagulants: Secondary | ICD-10-CM

## 2012-03-08 DIAGNOSIS — M1990 Unspecified osteoarthritis, unspecified site: Secondary | ICD-10-CM

## 2012-03-08 DIAGNOSIS — I5032 Chronic diastolic (congestive) heart failure: Secondary | ICD-10-CM

## 2012-03-08 DIAGNOSIS — S43429A Sprain of unspecified rotator cuff capsule, initial encounter: Secondary | ICD-10-CM | POA: Diagnosis present

## 2012-03-08 DIAGNOSIS — K2970 Gastritis, unspecified, without bleeding: Secondary | ICD-10-CM

## 2012-03-08 DIAGNOSIS — F329 Major depressive disorder, single episode, unspecified: Secondary | ICD-10-CM | POA: Diagnosis present

## 2012-03-08 DIAGNOSIS — D509 Iron deficiency anemia, unspecified: Secondary | ICD-10-CM

## 2012-03-08 DIAGNOSIS — E785 Hyperlipidemia, unspecified: Secondary | ICD-10-CM

## 2012-03-08 DIAGNOSIS — I10 Essential (primary) hypertension: Secondary | ICD-10-CM | POA: Diagnosis present

## 2012-03-08 DIAGNOSIS — Z79899 Other long term (current) drug therapy: Secondary | ICD-10-CM

## 2012-03-08 DIAGNOSIS — S0003XA Contusion of scalp, initial encounter: Secondary | ICD-10-CM | POA: Diagnosis present

## 2012-03-08 DIAGNOSIS — K3184 Gastroparesis: Secondary | ICD-10-CM

## 2012-03-08 DIAGNOSIS — I4891 Unspecified atrial fibrillation: Secondary | ICD-10-CM

## 2012-03-08 DIAGNOSIS — I2510 Atherosclerotic heart disease of native coronary artery without angina pectoris: Secondary | ICD-10-CM | POA: Diagnosis present

## 2012-03-08 DIAGNOSIS — G4733 Obstructive sleep apnea (adult) (pediatric): Secondary | ICD-10-CM | POA: Diagnosis present

## 2012-03-08 DIAGNOSIS — K56609 Unspecified intestinal obstruction, unspecified as to partial versus complete obstruction: Secondary | ICD-10-CM

## 2012-03-08 DIAGNOSIS — S06330A Contusion and laceration of cerebrum, unspecified, without loss of consciousness, initial encounter: Principal | ICD-10-CM | POA: Diagnosis present

## 2012-03-08 DIAGNOSIS — Z7982 Long term (current) use of aspirin: Secondary | ICD-10-CM

## 2012-03-08 DIAGNOSIS — R1310 Dysphagia, unspecified: Secondary | ICD-10-CM | POA: Diagnosis present

## 2012-03-08 DIAGNOSIS — N400 Enlarged prostate without lower urinary tract symptoms: Secondary | ICD-10-CM | POA: Diagnosis present

## 2012-03-08 DIAGNOSIS — I619 Nontraumatic intracerebral hemorrhage, unspecified: Secondary | ICD-10-CM | POA: Diagnosis present

## 2012-03-08 DIAGNOSIS — J90 Pleural effusion, not elsewhere classified: Secondary | ICD-10-CM | POA: Diagnosis present

## 2012-03-08 DIAGNOSIS — K648 Other hemorrhoids: Secondary | ICD-10-CM

## 2012-03-08 LAB — PROTIME-INR
INR: 1.9 — ABNORMAL HIGH (ref 0.00–1.49)
INR: 1.28 (ref 0.00–1.49)
Prothrombin Time: 21.1 seconds — ABNORMAL HIGH (ref 11.6–15.2)

## 2012-03-08 LAB — BASIC METABOLIC PANEL
GFR calc Af Amer: 87 mL/min — ABNORMAL LOW (ref >90)
GFR calc non Af Amer: 75 mL/min — ABNORMAL LOW (ref >90)
Glucose, Bld: 130 mg/dL — ABNORMAL HIGH (ref 70–99)
Potassium: 4.4 mEq/L (ref 3.5–5.1)
Sodium: 136 mEq/L (ref 135–145)

## 2012-03-08 LAB — HEPARIN LEVEL (UNFRACTIONATED): Heparin Unfractionated: >2 IU/mL — ABNORMAL HIGH (ref 0.30–0.70)

## 2012-03-08 LAB — TYPE AND SCREEN: ABO/RH(D): A POS

## 2012-03-08 LAB — CBC
Hemoglobin: 8.1 g/dL — ABNORMAL LOW (ref 13.0–17.0)
RBC: 3.24 MIL/uL — ABNORMAL LOW (ref 4.22–5.81)

## 2012-03-08 LAB — ABO/RH: ABO/RH(D): A POS

## 2012-03-08 MED ORDER — BACITRACIN-NEOMYCIN-POLYMYXIN OINTMENT TUBE
TOPICAL_OINTMENT | CUTANEOUS | Status: DC | PRN
  Administered 2012-03-09: 15 via TOPICAL
  Administered 2012-03-09 – 2012-03-10 (×2): 1 via TOPICAL
  Filled 2012-03-08: qty 15

## 2012-03-08 MED ORDER — BACITRACIN-NEOMYCIN-POLYMYXIN 400-5-5000 EX OINT
1.0000 "application " | TOPICAL_OINTMENT | CUTANEOUS | Status: DC | PRN
  Filled 2012-03-08: qty 1

## 2012-03-08 MED ORDER — SODIUM CHLORIDE 0.9 % IV SOLN
INTRAVENOUS | Status: DC
  Administered 2012-03-09: 08:00:00 via INTRAVENOUS

## 2012-03-08 MED ORDER — PANTOPRAZOLE SODIUM 40 MG IV SOLR
40.0000 mg | Freq: Every day | INTRAVENOUS | Status: DC
  Administered 2012-03-08: 40 mg via INTRAVENOUS
  Filled 2012-03-08 (×2): qty 40

## 2012-03-08 MED ORDER — EMPTY CONTAINERS FLEXIBLE MISC
1948.0000 [IU] | Status: AC
  Administered 2012-03-08: 1948 [IU] via INTRAVENOUS
  Filled 2012-03-08 (×2): qty 1948

## 2012-03-08 NOTE — H&P (Addendum)
Name: Eric [name] MRN: [id number] DOB: 1926/04/06    LOS: 0  Referring Provider:  [facility] ER Reason for Referral:  [facility]  PULMONARY / CRITICAL CARE MEDICINE  HPI:  78 yo male transferred from [facility] 03/08/2012 after fall with traumatic Rt frontal ICH.  He is on xarelto for chronic a fib.  PCCM asked to admit to ICU.  Pt was with his wife going to the gym.  They got out of car, and his turned around but didn't seem him.  She went around the car, and saw him laying on the ground.  She thinks he was "out" for a few seconds.  He then came to, and tried to get up.  His wife insisted that he stay on the ground until 911 could be called.  There was no report of seizure activity.  His wife reports that he did not have any new complaints prior to this event.    He was taken to [facility] and found to have Rt frontal ICH.  He was then transferred to [facility] for further neurosurgical evaluation.  He has mild headache.  He denies nasal congestion, chest pain, dyspnea, or abdominal pain.  He had b/l hip discomfort.   Past Medical History  Diagnosis Date  . BPH (benign prostatic hypertrophy)   . DJD (degenerative joint disease)   . Hx of transient ischemic attack (TIA)   . AAA (abdominal aortic aneurysm)   . Cerebrovascular disease   . HLD (hyperlipidemia)   . HTN (hypertension)   . Second degree AV block, Mobitz type II     s/p PPM  . CAD (coronary artery disease)   . Persistent atrial fibrillation     on pradaxa  . Sleep apnea     cpap machine  . Gastric ulcer, acute 02/2011    multiple - NSAID/Pradaxa thought causative. associated gastroparesis-like problems  . Small bowel obstruction     multiple over the years  . Anemia   . Anemia    Past Surgical History  Procedure Date  . Pacemaker insertion 01/24/10    Adapta Medtronic   . Coronary artery bypass graft   . Appendectomy   . Gastrectomy   . Knee arthroscopy     right, with medial and lateral meniscal  . Finger surgery      Left middle  finger dorsal lesion  . Upper gastrointestinal endoscopy 03/12/2011    stomach ulcer, esophageal erosion  . Esophagogastroduodenoscopy 01/13/2012    Procedure: ESOPHAGOGASTRODUODENOSCOPY (EGD);  Surgeon: Iva Boop, MD;  Location: [facility];  Service: Endoscopy;  Laterality: N/A;  . Flexible sigmoidoscopy 01/13/2012    Procedure: FLEXIBLE SIGMOIDOSCOPY;  Surgeon: Iva Boop, MD;  Location: [facility]OSCOPY;  Service: Endoscopy;  Laterality: N/A;  Fleets enema on arrival   Prior to Admission medications   Medication Sig Start Date End Date Taking? Authorizing Provider  aspirin 81 MG tablet Take 1 tablet (81 mg total) by mouth daily. 05/04/11   Gaylord Shih, MD  docusate sodium (COLACE) 100 MG capsule Take 100 mg by mouth daily.      Historical Provider, MD  finasteride (PROSCAR) 5 MG tablet TAKE 1 TABLET EVERY DAY 09/01/11   Gaylord Shih, MD  furosemide (LASIX) 20 MG tablet Take 1 tablet (20 mg total) by mouth daily. 11/02/11   Gaylord Shih, MD  KLOR-CON M20 20 MEQ tablet TAKE 1 TABLET EVERY DAY 06/18/11   Gaylord Shih, MD  Multiple Vitamin (MULTIVITAMINS PO) Take 1 tablet  by mouth daily.      Historical Provider, MD  pantoprazole (PROTONIX) 40 MG tablet TAKE ONE TABLET EVERY MORNING 03/06/12   Gaylord Shih, MD  Rivaroxaban (XARELTO) 20 MG TABS Take 1 tablet (20 mg total) by mouth daily. CONTINUE TO HOLD THIS MEDICATION UNTIL YOU HEAR FROM DR. WALL 01/13/12   Iva Boop, MD  simvastatin (ZOCOR) 20 MG tablet Take 1 tablet (20 mg total) by mouth every evening. 06/16/11 06/15/12  Gaylord Shih, MD  Tamsulosin HCl (FLOMAX) 0.4 MG CAPS Take 1 capsule (0.4 mg total) by mouth daily after supper. 03/12/11   Iva Boop, MD  traMADol (ULTRAM) 50 MG tablet as needed. 11/05/11   Historical Provider, MD  venlafaxine XR (EFFEXOR-XR) 37.5 MG 24 hr capsule TAKE ONE CAPSULE ONCE A DAY FOR 30 DAYS,THEN INCREASE TO 75MG . 10/18/11   Gaylord Shih, MD   Allergies Allergies  Allergen Reactions  . Morphine   .  Penicillins   . Promethazine Hcl     CHANGE IN BEHAVIOR    Family History Family History  Problem Relation Age of Onset  . Coronary artery disease Father   . Colon cancer Brother    Social History  reports that he quit smoking about 43 years ago. His smoking use included Cigarettes. He has a 5 pack-year smoking history. He has never used smokeless tobacco. He reports that he does not drink alcohol or use illicit drugs.  Events Since Admission:  Current Status: Seen in ICU, C-collar in place.  Vital Signs: Temp:  [97.7 F (36.5 C)-98.6 F (37 C)] 98 F (36.7 C) (10/02 1828) Pulse Rate:  [63-73] 70  (10/02 1828) Resp:  [17-20] 20  (10/02 1828) BP: (151-173)/(75-84) 167/84 mmHg (10/02 1828) SpO2:  [97 %] 97 % (10/02 1800) Weight:  [167 lb 12.3 oz (76.1 kg)] 167 lb 12.3 oz (76.1 kg) (10/02 1700)  Physical Examination: General - no distress HEENT - Peri-orbital ecchymosis on Rt with surrounding edema, bruising over Rt face, C collar in place Cardiac - irregular, no murmur, pulses symmetric Chest - decreased breath sounds Rt bases, no wheeze Abd - soft, decreased bowel sounds, non-tender, no masses GU - condom cath in place, no lesions Ext - no edema Skin - raised, red rash Rt anterior thigh Neuro - awake, follows commands, moves all extremities, agitated at times  Dg Pelvis Portable  03/08/2012  *RADIOLOGY REPORT*  Clinical Data: 78 year old male with pelvic pain following fall.  PORTABLE PELVIS  Comparison: 03/24/2011 and prior radiographs  Findings: There is no evidence of acute fracture, subluxation or dislocation. Heterotopic ossification along both hips are noted. Diffuse osteopenia is present. No focal bony lesions are identified. Degenerative changes in both hips are present.  IMPRESSION: No evidence of acute abnormality.  Bilateral hip degenerative changes with surrounding heterotopic ossification.   Original Report Authenticated By: Rosendo Gros, M.D.    Portable  Chest Xray  03/08/2012  *RADIOLOGY REPORT*  Clinical Data: Fall with chest pain and shortness of breath.  PORTABLE CHEST - 1 VIEW  Comparison: 03/22/2011 chest radiograph  Findings: Cardiomegaly with evidence of previous cardiac surgery and left-sided pacemaker again noted. Pulmonary vascular congestion is present. A right pleural effusion is present with right lower lung opacity - likely atelectasis. There is no evidence of pneumothorax or acute bony abnormality.  IMPRESSION: New right pleural effusion and right mid-lower lung opacity - question atelectasis.  Cardiomegaly with pulmonary vascular congestion.   Original Report Authenticated By: JEFFREY T.  HU, M.D.     ASSESSMENT AND PLAN  PULMONARY  A:  Recurrent Rt pleural effusion likely related to heart failure. Hx of OSA>>followed by Dr. Shelle Iron as outpt. P:   Oxygen to keep SpO2 > 92% F/u CXR  CARDIOVASCULAR  ECG:  A fib Lines: PIV  A: Hx of CAD, A fib, HTN, Hyperlipidemia, Mobitz II s/p PM, chronic diastolic dysfx.  Followed Dr. Daleen Squibb as outpt. P:  Hold ASA Hold xarelto>>likely will not restart Keep in even fluid balance  RENAL  Lab 03/08/12 1730  NA 136  K 4.4  CL 103  CO2 26  BUN 19  CREATININE 0.92  CALCIUM 9.1  MG --  PHOS --   Intake/Output      10/01 0701 - 10/02 0700 10/02 0701 - 10/03 0700   I.V. (mL/kg)  50 (0.7)   Blood  312.5   IV Piggyback  40   Total Intake(mL/kg)  402.5 (5.3)   Urine (mL/kg/hr)  175 (0.2)   Total Output  175   Net  +227.5        Urine Occurrence  1 x    Foley:  Condom Cath 10/02>>  A:  No active issues. P:   Monitor renal fx, urine outpt, electrolytes Hold proscar, flomax for now  GASTROINTESTINAL   A: Weight loss, chronic diarrhea, malabsorption in setting of hemigastrectomy. P:   NPO for now Will need speech therapy evaluation prior to initiating oral feedings  HEMATOLOGIC  Lab 03/08/12 1730  HGB 8.1*  HCT 26.3*  PLT 151  INR 1.90*  APTT 40*   A:  Iron  deficiency anemia 2nd to malabsorption after hemigastrectomy>>has been getting frequent transfusions and IV iron as outpt. Coagulopathy 2nd to Xarelto. P:  F/u CBC Transfuse for Hb < 7 FFP per rapid reversal protocol for xarelto  INFECTIOUS  Lab 03/08/12 1730  WBC 7.3  PROCALCITON --   Cultures: None Antibiotics: None  A: No evidence for infection. P:   Monitor clinically  ENDOCRINE  A:  No issues  NEUROLOGIC  CT head 10/02>>3 x 1.7 cm Rt frontal hemorrhage, chronic ischemic changes CT face 10/02>>soft tissue swelling/laceration Rt frontal and orbital region  A: Traumatic Rt frontal hemorrhage. P:   F/u CT head 10/02 and then decide if neurosurgical intervention needed Hold effexor for now  BEST PRACTICE / DISPOSITION Level of Care:  ICU Primary Service:  PCCM Consultants:  Neurosurgery Wynetta Emery) Code Status: Full Diet: NPO DVT Px: SCD GI Px: Protonix  Updated family at bedside.  Critical care time 40 minutes.  Coralyn Helling, MD [facility] Pulmonary/Critical Care 03/08/2012, 6:47 PM Pager:  [phone] After 3pm call: [phone]

## 2012-03-08 NOTE — Progress Notes (Signed)
eLink Physician-Brief Progress Note Patient Name: BRYLEE MCGREAL DOB: 10-25-1925 MRN: [id number]  Date of Service  03/08/2012   HPI/Events of Note   PCCM will admit this pt s/p fall with ICH, hematoma   eICU Interventions  See full note to follow.  Admit orders processed.   Intervention Category Major Interventions: Other: (ICH)  Shan Levans 03/08/2012, 5:15 PM

## 2012-03-08 NOTE — Consult Note (Signed)
Reason for Consult: Intracerebral hemorrhage Referring Physician: Dr. Shan [name] critical-care medicine  Eric [name] is an 78 y.o. male.  HPI: Patient is an 78 year old gentleman who had sustained a ground-level fall earlier today striking a curb at the right side of his head he didn't lose consciousness he is amnestic of the event he went to the [facility] emergency department was evaluated with a head CT has been transferred here and except to the critical care medicine. Currently the patient is awake he is complaining of headache he denies any neck pain denies any numbness tingling his arms or his legs.  Past Medical History  Diagnosis Date  . BPH (benign prostatic hypertrophy)   . DJD (degenerative joint disease)   . Hx of transient ischemic attack (TIA)   . AAA (abdominal aortic aneurysm)   . Cerebrovascular disease   . HLD (hyperlipidemia)   . HTN (hypertension)   . Second degree AV block, Mobitz type II     s/[name] PPM  . CAD (coronary artery disease)   . Persistent atrial fibrillation     on pradaxa  . Sleep apnea     cpap machine  . Gastric ulcer, acute 02/2011    multiple - NSAID/Pradaxa thought causative. associated gastroparesis-like problems  . Small bowel obstruction     multiple over the years  . Anemia   . Anemia     Past Surgical History  Procedure Date  . Pacemaker insertion 01/24/10    Adapta Medtronic   . Coronary artery bypass graft   . Appendectomy   . Gastrectomy   . Knee arthroscopy     right, with medial and lateral meniscal  . Finger surgery      Left middle finger dorsal lesion  . Upper gastrointestinal endoscopy 03/12/2011    stomach ulcer, esophageal erosion  . Esophagogastroduodenoscopy 01/13/2012    Procedure: ESOPHAGOGASTRODUODENOSCOPY (EGD);  Surgeon: Eric Boop, MD;  Location: [facility];  Service: Endoscopy;  Laterality: N/A;  . Flexible sigmoidoscopy 01/13/2012    Procedure: FLEXIBLE SIGMOIDOSCOPY;  Surgeon: Eric Boop,  MD;  Location: [facility]COPY;  Service: Endoscopy;  Laterality: N/A;  Fleets enema on arrival    Family History  Problem Relation Age of Onset  . Coronary artery disease Father   . Colon cancer Brother     Social History:  reports that he quit smoking about 43 years ago. His smoking use included Cigarettes. He has a 5 pack-year smoking history. He has never used smokeless tobacco. He reports that he does not drink alcohol or use illicit drugs.  Allergies:  Allergies  Allergen Reactions  . Morphine   . Penicillins   . Promethazine Hcl     CHANGE IN BEHAVIOR    Medications: I have reviewed the patient's current medications.  No results found for this or any previous visit (from the past 48 hour(s)).  No results found.  @ROS @ There were no vitals taken for this visit. Patient is awake and alert left pupil is 3 and reactive right pupil is difficult to visualize secondary to his periorbital edema and ecchymoses although extra compartments are intact in both eyes. Strength is 5 out of 5 in his upper and lower extremities reflexes are nonpathologic his neck is nontender he does have a laceration over his right eye and that was repaired to the aliments ER there is still some mild to moderate amount of oozing from the super aspect the incision more than likely related to his anticoagulation from  the xeralto  Assessment/Plan: 78 year old woman presents for evaluation of intracerebral hemorrhage anticoagulated on several time. Patient be evaluated medicine as well as cardiology the patient's unknown patient of Dr. Juanito [name]. Will follow followup head CT in 2 hours as well as will initiate a rapid reversal protocol to correct his anticoagulant pain. Laboratory from the outside hospital revealed an INR 1.8 PT in excess of 21 PTT 38. Preliminary report of a CT of his neck is negative will maintain a cervical collar for the time being until he can flex and extend and rule out ligamentous injury. We'll  obtain pelvic films to evaluate for occult tell that her hip fracture he does have a hematoma on the outside of his right hip.  Eric [name] 03/08/2012, 5:08 PM

## 2012-03-09 ENCOUNTER — Inpatient Hospital Stay (HOSPITAL_COMMUNITY): Payer: Medicare Other

## 2012-03-09 ENCOUNTER — Encounter (HOSPITAL_COMMUNITY): Payer: Self-pay | Admitting: Radiology

## 2012-03-09 ENCOUNTER — Ambulatory Visit: Payer: Self-pay | Admitting: Oncology

## 2012-03-09 DIAGNOSIS — I4891 Unspecified atrial fibrillation: Secondary | ICD-10-CM

## 2012-03-09 DIAGNOSIS — I5032 Chronic diastolic (congestive) heart failure: Secondary | ICD-10-CM

## 2012-03-09 DIAGNOSIS — I619 Nontraumatic intracerebral hemorrhage, unspecified: Secondary | ICD-10-CM

## 2012-03-09 LAB — PREPARE FRESH FROZEN PLASMA

## 2012-03-09 LAB — BASIC METABOLIC PANEL
Chloride: 105 mEq/L (ref 96–112)
GFR calc Af Amer: 87 mL/min — ABNORMAL LOW (ref >90)
Potassium: 4.4 mEq/L (ref 3.5–5.1)

## 2012-03-09 LAB — CBC
Platelets: 149 10*3/uL — ABNORMAL LOW (ref 150–400)
RDW: 21 % — ABNORMAL HIGH (ref 11.5–15.5)
WBC: 6.4 10*3/uL (ref 4.0–10.5)

## 2012-03-09 MED ORDER — FUROSEMIDE 20 MG PO TABS
20.0000 mg | ORAL_TABLET | Freq: Every day | ORAL | Status: DC
  Administered 2012-03-09 – 2012-03-14 (×6): 20 mg via ORAL
  Filled 2012-03-09 (×6): qty 1

## 2012-03-09 MED ORDER — DOCUSATE SODIUM 100 MG PO CAPS
100.0000 mg | ORAL_CAPSULE | Freq: Every day | ORAL | Status: DC
  Administered 2012-03-09 – 2012-03-13 (×5): 100 mg via ORAL
  Filled 2012-03-09 (×5): qty 1

## 2012-03-09 MED ORDER — FINASTERIDE 5 MG PO TABS
5.0000 mg | ORAL_TABLET | Freq: Every day | ORAL | Status: DC
  Administered 2012-03-09 – 2012-03-14 (×6): 5 mg via ORAL
  Filled 2012-03-09 (×6): qty 1

## 2012-03-09 MED ORDER — SIMVASTATIN 20 MG PO TABS
20.0000 mg | ORAL_TABLET | Freq: Every evening | ORAL | Status: DC
  Administered 2012-03-09 – 2012-03-13 (×5): 20 mg via ORAL
  Filled 2012-03-09 (×6): qty 1

## 2012-03-09 MED ORDER — BIOTENE DRY MOUTH MT LIQD
15.0000 mL | OROMUCOSAL | Status: DC
  Administered 2012-03-09 – 2012-03-11 (×15): 15 mL via OROMUCOSAL

## 2012-03-09 MED ORDER — PANTOPRAZOLE SODIUM 40 MG PO TBEC
40.0000 mg | DELAYED_RELEASE_TABLET | Freq: Every day | ORAL | Status: DC
  Administered 2012-03-09 – 2012-03-14 (×6): 40 mg via ORAL
  Filled 2012-03-09 (×6): qty 1

## 2012-03-09 MED ORDER — INFLUENZA VIRUS VACC SPLIT PF IM SUSP
0.5000 mL | INTRAMUSCULAR | Status: AC
  Administered 2012-03-10: 0.5 mL via INTRAMUSCULAR
  Filled 2012-03-09: qty 0.5

## 2012-03-09 NOTE — Progress Notes (Signed)
Subjective: Patient reports He is doing well this morning with no complaints mild headache denies any numbness tingling his arms or his legs  Objective: Vital signs in last 24 hours: Temp:  [97.4 F (36.3 C)-98.6 F (37 C)] 97.7 F (36.5 C) (10/03 0400) Pulse Rate:  [58-86] 69  (10/03 0600) Resp:  [14-22] 19  (10/03 0600) BP: (108-173)/(63-92) 142/70 mmHg (10/03 0600) SpO2:  [93 %-99 %] 94 % (10/03 0600) Weight:  [76.1 kg (167 lb 12.3 oz)-78 kg (171 lb 15.3 oz)] 78 kg (171 lb 15.3 oz) (10/03 0443)  Intake/Output from previous day: 10/02 0701 - 10/03 0700 In: 1022.5 [I.V.:670; Blood:312.5; IV Piggyback:40] Out: 975 [Urine:975] Intake/Output this shift: Total I/O In: 570 [I.V.:570] Out: 800 [Urine:800]  Awake alert oriented pupils are equal strength is 5 out of 5 no pronator drift laceration has stopped bleeding.  Lab Results:  Basename 03/09/12 0430 03/08/12 1730  WBC 6.4 7.3  HGB 7.4* 8.1*  HCT 24.0* 26.3*  PLT 149* 151   BMET  Basename 03/09/12 0430 03/08/12 1730  NA 139 136  K 4.4 4.4  CL 105 103  CO2 27 26  GLUCOSE 108* 130*  BUN 17 19  CREATININE 0.92 0.92  CALCIUM 9.0 9.1    Studies/Results: Ct Head Wo Contrast  03/08/2012  *RADIOLOGY REPORT*  Clinical Data: Intracranial hemorrhage  CT HEAD WITHOUT CONTRAST  Technique:  Contiguous axial images were obtained from the base of the skull through the vertex without contrast.  Comparison: 03/11/2010.  Outside imaging demonstrating known right ICH not available.  Findings: Intraparenchymal hemorrhage centered within the anterior right frontal lobe measures 2.7 x 2.0 cm.  Medial to the dominant hemorrhage, there is a 0.8 x 1.1 cm focus of intraparenchymal hemorrhage abutting the anterior falx. Just superior to the dominant hemorrhage, there is a 1.3 x 1.2 cm focus.  All three of the hematomas demonstrate mild surrounding edema with local sulcal effacement.  However, no significant global mass effect.  There is mild high  attenuation along the left tentorium as seen on series 3, image 13.  Attention on follow-up as this may reflect a small amount of extra-axial blood.  Prominence of the sulci, cisterns, and ventricles, in keeping with volume loss. There are subcortical and periventricular white matter hypodensities, a nonspecific finding most often seen with chronic microangiopathic changes.  There is no overt hydrocephalus. No definite CT evidence of additional acute infarction. 8 mm partially calcified focus along the right frontal bone as seen on series 4 image 46 may reflect a meningioma or hyperostosis.  The visualized paranasal sinuses and mastoid air cells are predominately clear.  Right frontal scalp and preseptal hematoma. Underlying calvarial fracture.  Advanced atherosclerotic vascular disease.  IMPRESSION: Three foci of intraparenchymal hemorrhage within the right frontal lobe, the largest of which measures 2.7 x 2.0 cm.  Mild local edema.  No global mass effect or midline shift.  Subtle high attenuation along the left tentorium may reflect mineralization or a trace amount extra-axial blood.  Attention at follow-up.  Right frontal scalp and preseptal hematoma.  No underlying calvarial fracture.   Original Report Authenticated By: Waneta Martins, M.D.    Dg Pelvis Portable  03/08/2012  *RADIOLOGY REPORT*  Clinical Data: 78 year old male with pelvic pain following fall.  PORTABLE PELVIS  Comparison: 03/24/2011 and prior radiographs  Findings: There is no evidence of acute fracture, subluxation or dislocation. Heterotopic ossification along both hips are noted. Diffuse osteopenia is present. No focal bony lesions are  identified. Degenerative changes in both hips are present.  IMPRESSION: No evidence of acute abnormality.  Bilateral hip degenerative changes with surrounding heterotopic ossification.   Original Report Authenticated By: Rosendo Gros, M.D.    Portable Chest Xray  03/08/2012  *RADIOLOGY REPORT*   Clinical Data: Fall with chest pain and shortness of breath.  PORTABLE CHEST - 1 VIEW  Comparison: 03/22/2011 chest radiograph  Findings: Cardiomegaly with evidence of previous cardiac surgery and left-sided pacemaker again noted. Pulmonary vascular congestion is present. A right pleural effusion is present with right lower lung opacity - likely atelectasis. There is no evidence of pneumothorax or acute bony abnormality.  IMPRESSION: New right pleural effusion and right mid-lower lung opacity - question atelectasis.  Cardiomegaly with pulmonary vascular congestion.   Original Report Authenticated By: Rosendo Gros, M.D.     Assessment/Plan: Post injury day run from a right frontal intracerebral hemorrhage stable on followup CT scan. I think we can advance his diet slowly mobilize with physical therapy I will order flexion extension cervical spine x-rays if these are negative we can discontinue his cervical collar.  LOS: 1 day     Ching Rabideau P 03/09/2012, 6:51 AM

## 2012-03-09 NOTE — Progress Notes (Signed)
Name: Eric [name] MRN: [id number] DOB: 09-11-1925    LOS: 1  Referring Provider:  [facility] ER Reason for Referral:  [facility]  PULMONARY / CRITICAL CARE MEDICINE  HPI:  78 yo male transferred from [facility] 03/08/2012 after fall with traumatic Rt frontal ICH.  He is on xarelto for chronic a fib.  PCCM asked to admit to ICU.  Pt was with his wife going to the gym.  They got out of car, and his turned around but didn't seem him.  She went around the car, and saw him laying on the ground.  She thinks he was "out" for a few seconds.  He then came to, and tried to get up.  His wife insisted that he stay on the ground until 911 could be called.  There was no report of seizure activity.  His wife reports that he did not have any new complaints prior to this event.    He was taken to [facility] and found to have Rt frontal ICH.  Intraparenchymal hemorrhage  noted in the right frontal cortex  measuring 29 x 20 mm. No midline shift is noted. Small amount of hemorrhage is noted in the posterior horn of the right lateral ventricle . Small amount of subarachnoid hemorrhage is seen overlying the right parietal cortex  He was then transferred to [facility] for further neurosurgical evaluation.    Events Since Admission: 10/3 head CT >>no significant change   Current Status:  C-collar in place. He has mild headache.  He denies nasal congestion, chest pain, dyspnea, or abdominal pain. denies hip discomfort.  Vital Signs: Temp:  [97.4 F (36.3 C)-98.6 F (37 C)] 98.6 F (37 C) (10/03 0700) Pulse Rate:  [58-86] 62  (10/03 0700) Resp:  [14-22] 18  (10/03 0700) BP: (108-173)/(63-92) 145/68 mmHg (10/03 0700) SpO2:  [93 %-99 %] 96 % (10/03 0700) Weight:  [76.1 kg (167 lb 12.3 oz)-78 kg (171 lb 15.3 oz)] 78 kg (171 lb 15.3 oz) (10/03 0443)  Physical Examination: General - no distress HEENT - Peri-orbital ecchymosis on Rt with surrounding edema, bruising over Rt face, C collar in place Cardiac - irregular, no murmur, pulses  symmetric Chest - decreased breath sounds Rt bases, no wheeze Abd - soft, decreased bowel sounds, non-tender, no masses GU - condom cath in place, no lesions Ext - no edema Skin - raised, red rash Rt anterior thigh Neuro - awake, follows commands, moves all extremities, non focal  Ct Head Wo Contrast  03/08/2012  *RADIOLOGY REPORT*  Clinical Data: Intracranial hemorrhage  CT HEAD WITHOUT CONTRAST  Technique:  Contiguous axial images were obtained from the base of the skull through the vertex without contrast.  Comparison: 03/11/2010.  Outside imaging demonstrating known right ICH not available.  Findings: Intraparenchymal hemorrhage centered within the anterior right frontal lobe measures 2.7 x 2.0 cm.  Medial to the dominant hemorrhage, there is a 0.8 x 1.1 cm focus of intraparenchymal hemorrhage abutting the anterior falx. Just superior to the dominant hemorrhage, there is a 1.3 x 1.2 cm focus.  All three of the hematomas demonstrate mild surrounding edema with local sulcal effacement.  However, no significant global mass effect.  There is mild high attenuation along the left tentorium as seen on series 3, image 13.  Attention on follow-up as this may reflect a small amount of extra-axial blood.  Prominence of the sulci, cisterns, and ventricles, in keeping with volume loss. There are subcortical and periventricular white matter hypodensities, a nonspecific finding most  often seen with chronic microangiopathic changes.  There is no overt hydrocephalus. No definite CT evidence of additional acute infarction. 8 mm partially calcified focus along the right frontal bone as seen on series 4 image 46 may reflect a meningioma or hyperostosis.  The visualized paranasal sinuses and mastoid air cells are predominately clear.  Right frontal scalp and preseptal hematoma. Underlying calvarial fracture.  Advanced atherosclerotic vascular disease.  IMPRESSION: Three foci of intraparenchymal hemorrhage within the right  frontal lobe, the largest of which measures 2.7 x 2.0 cm.  Mild local edema.  No global mass effect or midline shift.  Subtle high attenuation along the left tentorium may reflect mineralization or a trace amount extra-axial blood.  Attention at follow-up.  Right frontal scalp and preseptal hematoma.  No underlying calvarial fracture.   Original Report Authenticated By: Waneta Martins, M.D.    Dg Pelvis Portable  03/08/2012  *RADIOLOGY REPORT*  Clinical Data: 78 year old male with pelvic pain following fall.  PORTABLE PELVIS  Comparison: 03/24/2011 and prior radiographs  Findings: There is no evidence of acute fracture, subluxation or dislocation. Heterotopic ossification along both hips are noted. Diffuse osteopenia is present. No focal bony lesions are identified. Degenerative changes in both hips are present.  IMPRESSION: No evidence of acute abnormality.  Bilateral hip degenerative changes with surrounding heterotopic ossification.   Original Report Authenticated By: Rosendo Gros, M.D.    Portable Chest Xray  03/08/2012  *RADIOLOGY REPORT*  Clinical Data: Fall with chest pain and shortness of breath.  PORTABLE CHEST - 1 VIEW  Comparison: 03/22/2011 chest radiograph  Findings: Cardiomegaly with evidence of previous cardiac surgery and left-sided pacemaker again noted. Pulmonary vascular congestion is present. A right pleural effusion is present with right lower lung opacity - likely atelectasis. There is no evidence of pneumothorax or acute bony abnormality.  IMPRESSION: New right pleural effusion and right mid-lower lung opacity - question atelectasis.  Cardiomegaly with pulmonary vascular congestion.   Original Report Authenticated By: Rosendo Gros, M.D.     ASSESSMENT AND PLAN  PULMONARY  A:  Recurrent Rt pleural effusion likely related to heart failure. Hx of OSA>>followed by Dr. Shelle Iron as outpt. P:   Oxygen to keep SpO2 > 92% F/u CXR  CARDIOVASCULAR  ECG:  A fib Lines: PIV  A:  Hx of CAD, A fib, HTN, Hyperlipidemia, Mobitz II s/p PM, chronic diastolic dysfx.  Followed Dr. Daleen Squibb as outpt. P:  Hold ASA Hold xarelto>>likely will not restart Keep in even fluid balance, dc IVFs  RENAL  Lab 03/09/12 0430 03/08/12 1730  NA 139 136  K 4.4 4.4  CL 105 103  CO2 27 26  BUN 17 19  CREATININE 0.92 0.92  CALCIUM 9.0 9.1  MG -- --  PHOS -- --   Intake/Output      10/02 0701 - 10/03 0700 10/03 0701 - 10/04 0700   I.V. (mL/kg) 720 (9.2)    Blood 312.5    IV Piggyback 40    Total Intake(mL/kg) 1072.5 (13.8)    Urine (mL/kg/hr) 975 (0.5)    Total Output 975    Net +97.5         Urine Occurrence 1 x     Foley:  Condom Cath 10/02>>  A:  No active issues. P:   Monitor renal fx, urine outpt, electrolytes resume proscar, flomax for now  GASTROINTESTINAL   A: Weight loss, chronic diarrhea, malabsorption in setting of hemigastrectomy. P:   dys 3 diet per speech  recomm  HEMATOLOGIC  Lab 03/09/12 0430 03/08/12 2117 03/08/12 1730  HGB 7.4* -- 8.1*  HCT 24.0* -- 26.3*  PLT 149* -- 151  INR 1.38 1.28 1.90*  APTT -- -- 40*   A:  Iron deficiency anemia 2nd to malabsorption after hemigastrectomy>>has been getting frequent transfusions and IV iron as outpt. Coagulopathy 2nd to Xarelto. P:  F/u CBC/ INR - expect xarelto to go away in 2 ds Transfuse for Hb < 7 FFP per rapid reversal protocol for xarelto  INFECTIOUS  Lab 03/09/12 0430 03/08/12 1730  WBC 6.4 7.3  PROCALCITON -- --   Cultures: None Antibiotics: None  A: No evidence for infection. P:   Monitor clinically  ENDOCRINE  A:  No issues  NEUROLOGIC  CT head 10/02>>3 x 1.7 cm Rt frontal hemorrhage, chronic ischemic changes CT face 10/02>>soft tissue swelling/laceration Rt frontal and orbital region  A: Traumatic Rt frontal hemorrhage. P:   F/u CT head stavle, noneurosurgical intervention needed Hold effexor for now  BEST PRACTICE / DISPOSITION Level of Care:  ICU Primary Service:   PCCM Consultants:  Neurosurgery Wynetta Emery) Code Status: Full Diet: NPO DVT Px: SCD GI Px: Protonix  Updated family at bedside.  Critical care time 31 minutes.  Cyril Mourning MD. Tonny Bollman. [facility] Pager [phone] If no response call [phone]

## 2012-03-09 NOTE — Evaluation (Signed)
Physical Therapy Evaluation Patient Details Name: Eric [name] MRN: [id number] DOB: [birth year] Today's Date: 03/09/2012 Time: 1415-1450 PT Time Calculation (min): 35 min  PT Assessment / Plan / Recommendation Clinical Impression  pt adm after fall sustaining a right frontal ich.  Mobility limited by weakness, gait and balance disturbances, but expect pt to make steady progress.  Will see on acute.    PT Assessment  Patient needs continued PT services    Follow Up Recommendations  Home health PT;Other (comment) (transfer to OPPT as needed)    Barriers to Discharge None      Equipment Recommendations  Other (comment) (TBD)    Recommendations for Other Services     Frequency Min 4X/week    Precautions / Restrictions Precautions Precautions: Fall   Pertinent Vitals/Pain       Mobility  Bed Mobility Bed Mobility: Supine to Sit;Sitting - Scoot to Edge of Bed Supine to Sit: 3: Mod assist;HOB elevated Sitting - Scoot to Edge of Bed: 3: Mod assist Details for Bed Mobility Assistance: vc's for technique, guidance through his lethargy; truncal assist to come up Transfers Transfers: Sit to Stand;Stand to Sit Sit to Stand: 1: +2 Total assist;With upper extremity assist;From bed Sit to Stand: Patient Percentage: 60% Stand to Sit: 1: +2 Total assist;With upper extremity assist;To chair/3-in-1 Stand to Sit: Patient Percentage: 60% Details for Transfer Assistance: vc's for hand placement; steadying assist and assist to come forward Ambulation/Gait Ambulation/Gait Assistance: 1: +2 Total assist Ambulation/Gait: Patient Percentage: 60% Ambulation Distance (Feet): 60 Feet Assistive device: 2 person hand held assist Ambulation/Gait Assistance Details: mildly staggered, flexed posture, tends to lean too far forward and get ahead of himself Gait Pattern: Step-through pattern;Decreased step length - right;Decreased step length - left;Decreased stride length;Shuffle;Trunk flexed Gait  velocity: slow cadence Stairs: No Wheelchair Mobility Wheelchair Mobility: No    Shoulder Instructions     Exercises     PT Diagnosis: Difficulty walking;Generalized weakness  PT Problem List: Decreased strength;Decreased activity tolerance;Decreased balance;Decreased knowledge of use of DME;Decreased mobility;Decreased coordination PT Treatment Interventions: DME instruction;Gait training;Functional mobility training;Therapeutic activities;Balance training;Patient/family education   PT Goals Acute Rehab PT Goals PT Goal Formulation: With patient Time For Goal Achievement: 03/23/12 Potential to Achieve Goals: Good Pt will go Supine/Side to Sit: with supervision PT Goal: Supine/Side to Sit - Progress: Goal set today Pt will go Sit to Stand: with supervision PT Goal: Sit to Stand - Progress: Goal set today Pt will Transfer Bed to Chair/Chair to Bed: with supervision PT Transfer Goal: Bed to Chair/Chair to Bed - Progress: Goal set today Pt will Ambulate: 51 - 150 feet;Other (comment);with least restrictive assistive device (min guard) PT Goal: Ambulate - Progress: Goal set today Pt will Go Up / Down Stairs: 1-2 stairs;with min assist;with rail(s) PT Goal: Up/Down Stairs - Progress: Goal set today  Visit Information  Last PT Received On: 03/09/12 Assistance Needed: +2    Subjective Data  Patient Stated Goal: home independent   Prior Functioning  Home Living Lives With: Spouse Available Help at Discharge: Family Type of Home: House Home Access: Stairs to enter Secretary/administrator of Steps: 1 Entrance Stairs-Rails: Right;Left Home Layout: One level Bathroom Shower/Tub: Other (comment) Bathroom Toilet: Handicapped height Bathroom Accessibility: Yes How Accessible: Accessible via walker Home Adaptive Equipment: Bedside commode/3-in-1;Grab bars around toilet;Grab bars in shower;Walker - rolling;Straight cane Prior Function Level of Independence: Independent Able to Take  Stairs?:  (not as good on stairs latel) Driving: No Vocation: Part time employment Communication  Communication: No difficulties Dominant Hand: Right    Cognition  Overall Cognitive Status: Appears within functional limits for tasks assessed/performed Arousal/Alertness: Lethargic Orientation Level: Appears intact for tasks assessed Behavior During Session: [facility] for tasks performed    Extremity/Trunk Assessment Right Lower Extremity Assessment RLE ROM/Strength/Tone: [facility] for tasks assessed Left Lower Extremity Assessment LLE ROM/Strength/Tone: [facility] for tasks assessed;Deficits LLE ROM/Strength/Tone Deficits: Difficult to assess due to not follow all command in his state of lethargy Trunk Assessment Trunk Assessment: Normal   Balance Balance Balance Assessed: Yes Static Sitting Balance Static Sitting - Balance Support: Right upper extremity supported;Left upper extremity supported;Feet supported Static Sitting - Level of Assistance: 4: Min assist Static Standing Balance Static Standing - Balance Support: Bilateral upper extremity supported;During functional activity Static Standing - Level of Assistance: 4: Min assist  End of Session PT - End of Session Equipment Utilized During Treatment: Gait belt Activity Tolerance: Patient tolerated treatment well Patient left: in chair;with call bell/phone within reach;with family/visitor present Nurse Communication: Mobility status  GP     Dajanique Robley, Eliseo Gum 03/09/2012, 4:34 PM  03/09/2012  [place], [place] [phone] (pager)

## 2012-03-09 NOTE — Evaluation (Signed)
Clinical/Bedside Swallow Evaluation Patient Details  Name: Eric [name] MRN: [id number] Date of Birth: Sep 30, 1925  Today's Date: 03/09/2012 Time: 5284-1324 SLP Time Calculation (min): 18 min  Past Medical History:  Past Medical History  Diagnosis Date  . BPH (benign prostatic hypertrophy)   . DJD (degenerative joint disease)   . Hx of transient ischemic attack (TIA)   . AAA (abdominal aortic aneurysm)   . Cerebrovascular disease   . HLD (hyperlipidemia)   . HTN (hypertension)   . Second degree AV block, Mobitz type II     s/p PPM  . CAD (coronary artery disease)   . Persistent atrial fibrillation     on pradaxa  . Sleep apnea     cpap machine  . Gastric ulcer, acute 02/2011    multiple - NSAID/Pradaxa thought causative. associated gastroparesis-like problems  . Small bowel obstruction     multiple over the years  . Anemia   . Anemia    Past Surgical History:  Past Surgical History  Procedure Date  . Pacemaker insertion 01/24/10    Adapta [employer]   . Coronary artery bypass graft   . Appendectomy   . Gastrectomy   . Knee arthroscopy     right, with medial and lateral meniscal  . Finger surgery      Left middle finger dorsal lesion  . Upper gastrointestinal endoscopy 03/12/2011    stomach ulcer, esophageal erosion  . Esophagogastroduodenoscopy 01/13/2012    Procedure: ESOPHAGOGASTRODUODENOSCOPY (EGD);  Surgeon: Iva Boop, MD;  Location: [facility];  Service: Endoscopy;  Laterality: N/A;  . Flexible sigmoidoscopy 01/13/2012    Procedure: FLEXIBLE SIGMOIDOSCOPY;  Surgeon: Iva Boop, MD;  Location: [facility];  Service: Endoscopy;  Laterality: N/A;  Fleets enema on arrival   HPI:  78 yr old admitted after fall getting out of his car going to the gym.  Pt. sustained a right ICH without intubation or surgical intervention.  PMH:  TIA, gastric ulcer, gastrectomy, CABG.  CXR with questionable atelectasis, pleural effusion.   Assessment / Plan /  Recommendation Clinical Impression  Pt. demonstrated mildly delayed oral transit with cracker.  Delayed cough x 1 with straw sip thin.  Small cup sips water appeared safe with pt. in upright position.  Recommend Dys 3 diet to facilitate energy conservation and thin liquids, no straws, pills whole in applesauce.      Aspiration Risk  Mild    Diet Recommendation Dysphagia 3 (Mechanical Soft);Thin liquid   Liquid Administration via: Cup;No straw Medication Administration: Whole meds with puree Compensations: Slow rate;Small sips/bites Postural Changes and/or Swallow Maneuvers: Seated upright 90 degrees;Upright 30-60 min after meal    Other  Recommendations Oral Care Recommendations: Oral care BID   Follow Up Recommendations  Inpatient Rehab    Frequency and Duration min 2x/week  2 weeks        SLP Swallow Goals Patient will consume recommended diet without observed clinical signs of aspiration with: Minimal cueing Patient will utilize recommended strategies during swallow to increase swallowing safety with: Minimal cueing   Swallow Study Prior Functional Status       General HPI: 78 yr old admitted after fall getting out of his car going to the gym.  Pt. sustained a right ICH without intubation or surgical intervention.  PMH:  TIA, gastric ulcer, gastrectomy, CABG.  CXR with questionable atelectasis, pleural effusion. Type of Study: Bedside swallow evaluation Diet Prior to this Study: NPO Temperature Spikes Noted: No Respiratory Status: Supplemental O2 delivered  via (comment) History of Recent Intubation: No Behavior/Cognition: Pleasant mood;Cooperative;Lethargic Oral Cavity - Dentition: Dentures, top Self-Feeding Abilities: Needs assist Patient Positioning: Upright in bed Baseline Vocal Quality: Hoarse;Low vocal intensity;Clear Volitional Cough:  (mildly weak) Volitional Swallow: Able to elicit    Oral/Motor/Sensory Function Overall Oral Motor/Sensory Function:   (generalized weakness)   Ice Chips Ice chips: Within functional limits   Thin Liquid Thin Liquid: Impaired Presentation: Cup;Straw;Spoon Pharyngeal  Phase Impairments: Cough - Delayed    Nectar Thick Nectar Thick Liquid: Not tested   Honey Thick Honey Thick Liquid: Not tested   Puree Puree: Within functional limits   Solid       Solid: Impaired Oral Phase Impairments: Reduced lingual movement/coordination Oral Phase Functional Implications:  (mildly delayed swallow transit)       Royce Macadamia M.Ed [employer] [phone]  03/09/2012

## 2012-03-09 NOTE — Progress Notes (Signed)
UR COMPLETED  

## 2012-03-09 NOTE — Progress Notes (Signed)
Sitter arrived.  Family going home to get rest.  EKG obtained and faxed to CCM.  Aspen collar applied.  New dressing applied to R head laceration at this time.  Neosporin ordered via Dr. Wynetta Emery.  PT/INR for 0500am ordered as well.  Pt. stable and comfortable at this time.  Will continue to monitor.

## 2012-03-10 ENCOUNTER — Inpatient Hospital Stay (HOSPITAL_COMMUNITY): Payer: Medicare Other

## 2012-03-10 DIAGNOSIS — J90 Pleural effusion, not elsewhere classified: Secondary | ICD-10-CM

## 2012-03-10 DIAGNOSIS — G4733 Obstructive sleep apnea (adult) (pediatric): Secondary | ICD-10-CM

## 2012-03-10 LAB — BASIC METABOLIC PANEL
BUN: 20 mg/dL (ref 6–23)
Calcium: 9.2 mg/dL (ref 8.4–10.5)
GFR calc non Af Amer: 73 mL/min — ABNORMAL LOW (ref >90)
Glucose, Bld: 119 mg/dL — ABNORMAL HIGH (ref 70–99)
Potassium: 3.9 mEq/L (ref 3.5–5.1)

## 2012-03-10 LAB — CBC
Hemoglobin: 7.8 g/dL — ABNORMAL LOW (ref 13.0–17.0)
MCH: 24.6 pg — ABNORMAL LOW (ref 26.0–34.0)
MCHC: 30.2 g/dL (ref 30.0–36.0)

## 2012-03-10 LAB — PROTIME-INR
INR: 1.25 (ref 0.00–1.49)
Prothrombin Time: 15.5 seconds — ABNORMAL HIGH (ref 11.6–15.2)

## 2012-03-10 MED ORDER — ACETAMINOPHEN 325 MG PO TABS
650.0000 mg | ORAL_TABLET | ORAL | Status: DC | PRN
  Administered 2012-03-10 – 2012-03-13 (×5): 650 mg via ORAL
  Filled 2012-03-10 (×5): qty 2

## 2012-03-10 NOTE — Progress Notes (Signed)
Subjective: Patient reports Is feeling better this morning and is improved with regard to his headache and his nausea. He is complaining of right shoulder pain that is new for him  Objective: Vital signs in last 24 hours: Temp:  [97.5 F (36.4 C)-99.2 F (37.3 C)] 98.5 F (36.9 C) (10/04 0000) Pulse Rate:  [58-74] 63  (10/04 0700) Resp:  [14-25] 21  (10/04 0700) BP: (109-162)/(51-97) 156/75 mmHg (10/04 0700) SpO2:  [91 %-100 %] 96 % (10/04 0700) Weight:  [76.2 kg (167 lb 15.9 oz)] 76.2 kg (167 lb 15.9 oz) (10/04 0500)  Intake/Output from previous day: 10/03 0701 - 10/04 0700 In: 530 [P.O.:480; I.V.:50] Out: 1305 [Urine:1305] Intake/Output this shift:    The patient is awake alert pupils are equal extraocular movements are intact the swelling is going down over his right eye the laceration is clean and dry he does have a lot of pain on movement of the right shoulder  Lab Results:  Basename 03/09/12 0430 03/08/12 1730  WBC 6.4 7.3  HGB 7.4* 8.1*  HCT 24.0* 26.3*  PLT 149* 151   BMET  Basename 03/09/12 0430 03/08/12 1730  NA 139 136  K 4.4 4.4  CL 105 103  CO2 27 26  GLUCOSE 108* 130*  BUN 17 19  CREATININE 0.92 0.92  CALCIUM 9.0 9.1    Studies/Results: Ct Head Wo Contrast  03/09/2012  *RADIOLOGY REPORT*  Clinical Data: Intracranial hemorrhage.  CT HEAD WITHOUT CONTRAST  Technique:  Contiguous axial images were obtained from the base of the skull through the vertex without contrast.  Comparison: March 08, 2012.  Findings: Bony calvarium is intact.  Diffuse cortical atrophy is noted.  Chronic ischemic white matter disease is noted.  Scalp hematoma overlies right frontal skull.  Intraparenchymal hemorrhage is again noted in the right frontal cortex which is not significantly changed compared to prior exam, measuring 29 x 20 mm. No midline shift is noted.  Small amount of hemorrhage is noted in the posterior horn of the right lateral ventricle which is unchanged compared to  prior exam.  Small amount of subarachnoid hemorrhage is seen overlying the right parietal cortex.  There is noted mildly asymmetric high density overlying the left tentorium which may represent small subdural hemorrhage in this area. No definite mass lesion or acute infarction is noted.  IMPRESSION: Overall, there is no significant change compared to prior exam in terms of the right frontal intraparenchymal hemorrhage. No midline shift is noted. Small amount of subarachnoid hemorrhage is seen over the right parietal cortex as well as small amount of hemorrhage in the posterior horn of right lateral ventricle.  No change is noted in the asymmetric high density concerning for small subdural hemorrhage.   Original Report Authenticated By: Venita Sheffield., M.D.    Ct Head Wo Contrast  03/08/2012  *RADIOLOGY REPORT*  Clinical Data: Intracranial hemorrhage  CT HEAD WITHOUT CONTRAST  Technique:  Contiguous axial images were obtained from the base of the skull through the vertex without contrast.  Comparison: 03/11/2010.  Outside imaging demonstrating known right ICH not available.  Findings: Intraparenchymal hemorrhage centered within the anterior right frontal lobe measures 2.7 x 2.0 cm.  Medial to the dominant hemorrhage, there is a 0.8 x 1.1 cm focus of intraparenchymal hemorrhage abutting the anterior falx. Just superior to the dominant hemorrhage, there is a 1.3 x 1.2 cm focus.  All three of the hematomas demonstrate mild surrounding edema with local sulcal effacement.  However, no significant  global mass effect.  There is mild high attenuation along the left tentorium as seen on series 3, image 13.  Attention on follow-up as this may reflect a small amount of extra-axial blood.  Prominence of the sulci, cisterns, and ventricles, in keeping with volume loss. There are subcortical and periventricular white matter hypodensities, a nonspecific finding most often seen with chronic microangiopathic changes.  There is  no overt hydrocephalus. No definite CT evidence of additional acute infarction. 8 mm partially calcified focus along the right frontal bone as seen on series 4 image 46 may reflect a meningioma or hyperostosis.  The visualized paranasal sinuses and mastoid air cells are predominately clear.  Right frontal scalp and preseptal hematoma. Underlying calvarial fracture.  Advanced atherosclerotic vascular disease.  IMPRESSION: Three foci of intraparenchymal hemorrhage within the right frontal lobe, the largest of which measures 2.7 x 2.0 cm.  Mild local edema.  No global mass effect or midline shift.  Subtle high attenuation along the left tentorium may reflect mineralization or a trace amount extra-axial blood.  Attention at follow-up.  Right frontal scalp and preseptal hematoma.  No underlying calvarial fracture.   Original Report Authenticated By: Waneta Martins, M.D.    Dg Pelvis Portable  03/08/2012  *RADIOLOGY REPORT*  Clinical Data: 78 year old male with pelvic pain following fall.  PORTABLE PELVIS  Comparison: 03/24/2011 and prior radiographs  Findings: There is no evidence of acute fracture, subluxation or dislocation. Heterotopic ossification along both hips are noted. Diffuse osteopenia is present. No focal bony lesions are identified. Degenerative changes in both hips are present.  IMPRESSION: No evidence of acute abnormality.  Bilateral hip degenerative changes with surrounding heterotopic ossification.   Original Report Authenticated By: Rosendo Gros, M.D.    Dg Chest Port 1 View  03/09/2012  *RADIOLOGY REPORT*  Clinical Data: Bilateral effusions.  PORTABLE CHEST - 1 VIEW  Comparison: 03/08/2012  Findings: Left pacer remains in place, unchanged.  Prior CABG. Stable right pleural effusion and right lung airspace disease. Stable cardiomegaly.  IMPRESSION: No significant change.   Original Report Authenticated By: Cyndie Chime, M.D.    Portable Chest Xray  03/08/2012  *RADIOLOGY REPORT*   Clinical Data: Fall with chest pain and shortness of breath.  PORTABLE CHEST - 1 VIEW  Comparison: 03/22/2011 chest radiograph  Findings: Cardiomegaly with evidence of previous cardiac surgery and left-sided pacemaker again noted. Pulmonary vascular congestion is present. A right pleural effusion is present with right lower lung opacity - likely atelectasis. There is no evidence of pneumothorax or acute bony abnormality.  IMPRESSION: New right pleural effusion and right mid-lower lung opacity - question atelectasis.  Cardiomegaly with pulmonary vascular congestion.   Original Report Authenticated By: Rosendo Gros, M.D.    Dg Cerv Spine Flex&ext Only  03/09/2012  *RADIOLOGY REPORT*  Clinical Data: Larey Seat  CERVICAL SPINE - FLEXION AND EXTENSION VIEWS ONLY  Comparison: 03/11/2010  Findings: There are 3-4 mm anterolisthesis C4-5 without evidence of dynamic instability on   flexion/extension lateral radiographs. Facet degenerative changes are suspected C3-4, C4-5, C5-6, C6-7. No definite fracture.  The cervicothoracic junction is not visualized.  Multiple missing teeth and restorations.  IMPRESSION:    3-4 mm anterolisthesis C4-5 without evidence of dynamic instability.   Original Report Authenticated By: Osa Craver, M.D.     Assessment/Plan: Postop day 2 from a fall closed head injury and right frontal intracerebral hemorrhage. Progressing well we'll continue to work with physical outpatient therapy his INR this morning  is 1.38 we'll need to repeat that in the morning if he gets over 1 point forward favor additional FFP. I would order x-rays of his right shoulder to rule out fracture.  LOS: 2 days     Novaleigh Kohlman P 03/10/2012, 7:17 AM

## 2012-03-10 NOTE — Progress Notes (Addendum)
Rehab Admissions Coordinator Note:  Patient was screened by Clois Dupes for appropriateness for an Inpatient Acute Rehab Consult.  PT and OT are recommending an inpt rehab admission prior to d/c home. At this time, we are recommending Inpatient Rehab consult. Please order and we will follow up with our assessment for possible admit when patient medically ready.  Clois Dupes 03/10/2012, 4:53 PM  I can be reached at [phone].

## 2012-03-10 NOTE — Progress Notes (Signed)
Speech Language Pathology Dysphagia Treatment Patient Details Name: Eric [name] MRN: [id number] DOB: 02/15/1926 Today's Date: 03/10/2012 Time: 9629-5284 SLP Time Calculation (min): 12 min  Assessment / Plan / Recommendation Clinical Impression  Pt. seen for dysphagia treatment with wife present and educated on bedside swallow assessment yesterday.  Pt. somewhat more confused this session.  Observed pt. with thin liquid with straw for upgrade of precautions.  He required min verbal cues for smaller sips.  He masticated graham cracker without difficulty.  ST will continue to follow for diet upgrade next week and continued safety with po's.  Pt. would benefit from speecch-cognitive assessment (please order if agree).      Diet Recommendation  Continue with Current Diet: Dysphagia 3 (mechanical soft);Thin liquid    SLP Plan Continue with current plan of care       Swallowing Goals  SLP Swallowing Goals Patient will consume recommended diet without observed clinical signs of aspiration with: Minimal cueing Swallow Study Goal #1 - Progress: Progressing toward goal Patient will utilize recommended strategies during swallow to increase swallowing safety with: Minimal cueing Swallow Study Goal #2 - Progress: Progressing toward goal  General Temperature Spikes Noted: No Respiratory Status: Supplemental O2 delivered via (comment) Behavior/Cognition: Alert;Cooperative;Requires cueing;Distractible;Impulsive Oral Cavity - Dentition: Dentures, top Patient Positioning: Upright in chair  Oral Cavity - Oral Hygiene Does patient have any of the following "at risk" factors?: Oxygen therapy - cannula, mask, simple oxygen devices Brush patient's teeth BID with toothbrush (using toothpaste with fluoride): Yes Patient is AT RISK - Oral Care Protocol followed (see row info): Yes   Dysphagia Treatment Treatment focused on: Skilled observation of diet tolerance;Patient/family/caregiver  Dealer Educated: wife Treatment Methods/Modalities: Skilled observation Patient observed directly with PO's: Yes Type of PO's observed: Dysphagia 3 (soft);Thin liquids Feeding: Able to feed self Liquids provided via: Cup;Straw Type of cueing: Verbal Amount of cueing: Moderate        Breck Coons Burns.[employer] [phone]  03/10/2012

## 2012-03-10 NOTE — Evaluation (Signed)
Occupational Therapy Evaluation Patient Details Name: Eric [name] MRN: [id number] DOB: 1926/03/04 Today's Date: 03/10/2012 Time: 4098-1191 OT Time Calculation (min): 32 min  OT Assessment / Plan / Recommendation Clinical Impression  Pt with right frontal ICH after fall and presents with difficulty with initiation and processing in addition to limitation by pain and generalized weakness. Pt will benefit from skilled OT in the acute setting followed by CIR to maximize I with ADL and ADL mobility prior to d/c    OT Assessment  Patient needs continued OT Services    Follow Up Recommendations  Inpatient Rehab    Barriers to Discharge      Equipment Recommendations  None recommended by OT    Recommendations for Other Services Rehab consult  Frequency  Min 2X/week    Precautions / Restrictions Precautions Precautions: Fall Restrictions Weight Bearing Restrictions: No   Pertinent Vitals/Pain Pt reports Rt shoulder pain with movement but did not rate. RN aware. Pt RUE placed on pillow for pain relief    ADL  Grooming: Simulated;Moderate assistance Where Assessed - Grooming: Supported sitting Upper Body Dressing: Simulated;Moderate assistance Where Assessed - Upper Body Dressing: Supported sitting Lower Body Dressing: Performed;Maximal assistance Where Assessed - Lower Body Dressing: Supported sit to Pharmacist, hospital: Simulated;Moderate assistance Toilet Transfer Method: Sit to Barista:  (to/from chair) Toileting - Architect and Hygiene: Simulated;Maximal assistance Where Assessed - Engineer, mining and Hygiene: Sit to stand from 3-in-1 or toilet Equipment Used: Gait belt;Rolling walker Transfers/Ambulation Related to ADLs: Pt +2total A(pt=60%) with RW ambulation into hall and back. Pt fatigues easily. 02 remained stable in the 90's (on 2L 02). ADL Comments: Slow processing/difficulty initiating (per PT worse than eval)    OT Diagnosis: Cognitive deficits;Generalized weakness;Acute pain;Apraxia  OT Problem List: Decreased strength;Decreased activity tolerance;Decreased range of motion;Impaired balance (sitting and/or standing);Decreased coordination;Decreased cognition;Decreased safety awareness;Decreased knowledge of use of DME or AE;Decreased knowledge of precautions;Cardiopulmonary status limiting activity;Impaired UE functional use;Pain OT Treatment Interventions: Self-care/ADL training;DME and/or AE instruction;Therapeutic activities;Cognitive remediation/compensation;Patient/family education;Balance training   OT Goals Acute Rehab OT Goals OT Goal Formulation: With patient Time For Goal Achievement: 03/24/12 Potential to Achieve Goals: Good ADL Goals Pt Will Perform Grooming: with supervision;Sitting at sink;Standing at sink ADL Goal: Grooming - Progress: Goal set today Pt Will Perform Upper Body Dressing: with set-up;with supervision;Sitting, bed;Sitting, chair ADL Goal: Upper Body Dressing - Progress: Goal set today Pt Will Perform Lower Body Dressing: with min assist;Sit to stand from chair;Sit to stand from bed ADL Goal: Lower Body Dressing - Progress: Goal set today Pt Will Transfer to Toilet: with supervision;Ambulation;with DME ADL Goal: Toilet Transfer - Progress: Goal set today Pt Will Perform Toileting - Clothing Manipulation: with supervision;Standing ADL Goal: Toileting - Clothing Manipulation - Progress: Goal set today Pt Will Perform Toileting - Hygiene: with supervision;Standing at 3-in-1/toilet ADL Goal: Toileting - Hygiene - Progress: Goal set today Pt Will Perform Tub/Shower Transfer: Shower transfer;with min assist;Ambulation;with DME ADL Goal: Web designer - Progress: Goal set today Additional ADL Goal #1: Pt will initiate ADL activity within 20sec of first mention with one cue. ADL Goal: Additional Goal #1 - Progress: Goal set today  Visit Information  Last OT Received  On: 03/10/12 Assistance Needed: +2 PT/OT Co-Evaluation/Treatment: Yes    Subjective Data  Subjective: Pt agreeable to OOB with therapy Patient Stated Goal: Return home with wife   Prior Functioning     Home Living Lives With: Spouse Available Help at Discharge: Family Type  of Home: House Home Access: Stairs to enter [employer] of Steps: 1 Entrance Stairs-Rails: Right;Left Home Layout: One level Bathroom Shower/Tub: Other (comment) (walk-in tub) Bathroom Toilet: Handicapped height Bathroom Accessibility: Yes How Accessible: Accessible via walker Home Adaptive Equipment: Bedside commode/3-in-1;Grab bars around toilet;Grab bars in shower;Walker - rolling;Straight cane Prior Function Level of Independence: Independent Able to Take Stairs?:  (difficulty with stairs lately) Driving: No Vocation: Part time employment Communication Communication: No difficulties Dominant Hand: Right         Vision/Perception Vision - Assessment Additional Comments: pt with burst blood vessels in right eye. reports it is blurry but denies double vision. peripheral vision appears intact on testing. Will continue to monitor   Cognition  Overall Cognitive Status: Impaired Area of Impairment: Attention;Following commands;Problem solving;Executive functioning Arousal/Alertness: Awake/alert Behavior During Session: [facility] for tasks performed Current Attention Level: Sustained Following Commands: Follows one step commands inconsistently Executive Functioning: trouble with initiation and also with perseveration of ideas    Extremity/Trunk Assessment Right Upper Extremity Assessment RUE ROM/Strength/Tone: Deficits;Unable to fully assess;Due to pain;Due to impaired cognition RUE ROM/Strength/Tone Deficits: Pt with h/o RC injury and unable to lift UE greater than 80degrees. Elbow and distally, strenght appears WFL- unsure about grasp RUE Sensation: WFL - Light Touch RUE Coordination:  Deficits Left Upper Extremity Assessment LUE ROM/Strength/Tone: Deficits;Unable to fully assess;Due to impaired cognition LUE ROM/Strength/Tone Deficits: Pt with h/o RC injury and unable to lift UE greater than 80degrees. Elbow and distally, strenght appears WFL LUE Sensation: WFL - Light Touch LUE Coordination: WFL - gross/fine motor     Mobility Bed Mobility Bed Mobility: Supine to Sit;Sitting - Scoot to Edge of Bed Supine to Sit: 3: Mod assist;HOB elevated Sitting - Scoot to Edge of Bed: 3: Mod assist Details for Bed Mobility Assistance: vc's for technique, repetition due to perseveration and decr initiation, truncal assist Transfers Sit to Stand: 3: Mod assist Stand to Sit: 3: Mod assist Details for Transfer Assistance: vc's for hand placement; steadying assist and assist to come forward     Shoulder Instructions     Exercise     Balance     End of Session OT - End of Session Equipment Utilized During Treatment: Gait belt Activity Tolerance: Patient tolerated treatment well;Patient limited by fatigue Patient left: in chair;with call bell/phone within reach;with family/visitor present Nurse Communication: Mobility status  GO     Dannah Ryles 03/10/2012, 2:57 PM

## 2012-03-10 NOTE — Progress Notes (Signed)
Name: Eric [name] MRN: [id number] DOB: 03-Apr-1926    LOS: 2  Referring Provider:  [facility] ER Reason for Referral:  ICH  PULMONARY / CRITICAL CARE MEDICINE  HPI:  78 yo male transferred from [facility] 03/08/2012 after fall with traumatic Rt frontal ICH.  He is on xarelto for chronic a fib.  PCCM asked to admit to ICU.  Pt was with his wife going to the gym.  They got out of car, and his turned around but didn't seem him.  She went around the car, and saw him laying on the ground.  She thinks he was "out" for a few seconds.  He then came to, and tried to get up.  His wife insisted that he stay on the ground until 911 could be called.  There was no report of seizure activity.  His wife reports that he did not have any new complaints prior to this event.    He was taken to [facility] and found to have Rt frontal ICH.  Intraparenchymal hemorrhage  noted in the right frontal cortex  measuring 29 x 20 mm. No midline shift is noted. Small amount of hemorrhage is noted in the posterior horn of the right lateral ventricle . Small amount of subarachnoid hemorrhage is seen overlying the right parietal cortex  He was then transferred to [facility] for further neurosurgical evaluation.    Events Since Admission: 10/3 head CT >>no significant change, c-collar cleared   Current Status: C/o mild headache.  He denies nasal congestion, chest pain, dyspnea, or abdominal pain. denies hip discomfort. C/o shoulder pain Did not rest well alst night  Vital Signs: Temp:  [97.5 F (36.4 C)-99.2 F (37.3 C)] 98.5 F (36.9 C) (10/04 0000) Pulse Rate:  [58-74] 63  (10/04 0700) Resp:  [14-25] 21  (10/04 0700) BP: (109-162)/(51-97) 156/75 mmHg (10/04 0700) SpO2:  [91 %-100 %] 96 % (10/04 0700) Weight:  [76.2 kg (167 lb 15.9 oz)] 76.2 kg (167 lb 15.9 oz) (10/04 0500)  Physical Examination: General - no distress HEENT - Peri-orbital ecchymosis on Rt with surrounding edema, bruising over Rt face Cardiac - irregular, no murmur,  pulses symmetric Chest - decreased breath sounds Rt bases, no wheeze Abd - soft, decreased bowel sounds, non-tender, no masses GU - condom cath in place, no lesions Ext - no edema Skin - raised, red rash Rt anterior thigh Neuro - awake, follows commands, moves all extremities, non focal  Ct Head Wo Contrast  03/09/2012  *RADIOLOGY REPORT*  Clinical Data: Intracranial hemorrhage.  CT HEAD WITHOUT CONTRAST  Technique:  Contiguous axial images were obtained from the base of the skull through the vertex without contrast.  Comparison: March 08, 2012.  Findings: Bony calvarium is intact.  Diffuse cortical atrophy is noted.  Chronic ischemic white matter disease is noted.  Scalp hematoma overlies right frontal skull.  Intraparenchymal hemorrhage is again noted in the right frontal cortex which is not significantly changed compared to prior exam, measuring 29 x 20 mm. No midline shift is noted.  Small amount of hemorrhage is noted in the posterior horn of the right lateral ventricle which is unchanged compared to prior exam.  Small amount of subarachnoid hemorrhage is seen overlying the right parietal cortex.  There is noted mildly asymmetric high density overlying the left tentorium which may represent small subdural hemorrhage in this area. No definite mass lesion or acute infarction is noted.  IMPRESSION: Overall, there is no significant change compared to prior exam in terms of  the right frontal intraparenchymal hemorrhage. No midline shift is noted. Small amount of subarachnoid hemorrhage is seen over the right parietal cortex as well as small amount of hemorrhage in the posterior horn of right lateral ventricle.  No change is noted in the asymmetric high density concerning for small subdural hemorrhage.   Original Report Authenticated By: Venita Sheffield., M.D.    Ct Head Wo Contrast  03/08/2012  *RADIOLOGY REPORT*  Clinical Data: Intracranial hemorrhage  CT HEAD WITHOUT CONTRAST  Technique:  Contiguous  axial images were obtained from the base of the skull through the vertex without contrast.  Comparison: 03/11/2010.  Outside imaging demonstrating known right ICH not available.  Findings: Intraparenchymal hemorrhage centered within the anterior right frontal lobe measures 2.7 x 2.0 cm.  Medial to the dominant hemorrhage, there is a 0.8 x 1.1 cm focus of intraparenchymal hemorrhage abutting the anterior falx. Just superior to the dominant hemorrhage, there is a 1.3 x 1.2 cm focus.  All three of the hematomas demonstrate mild surrounding edema with local sulcal effacement.  However, no significant global mass effect.  There is mild high attenuation along the left tentorium as seen on series 3, image 13.  Attention on follow-up as this may reflect a small amount of extra-axial blood.  Prominence of the sulci, cisterns, and ventricles, in keeping with volume loss. There are subcortical and periventricular white matter hypodensities, a nonspecific finding most often seen with chronic microangiopathic changes.  There is no overt hydrocephalus. No definite CT evidence of additional acute infarction. 8 mm partially calcified focus along the right frontal bone as seen on series 4 image 46 may reflect a meningioma or hyperostosis.  The visualized paranasal sinuses and mastoid air cells are predominately clear.  Right frontal scalp and preseptal hematoma. Underlying calvarial fracture.  Advanced atherosclerotic vascular disease.  IMPRESSION: Three foci of intraparenchymal hemorrhage within the right frontal lobe, the largest of which measures 2.7 x 2.0 cm.  Mild local edema.  No global mass effect or midline shift.  Subtle high attenuation along the left tentorium may reflect mineralization or a trace amount extra-axial blood.  Attention at follow-up.  Right frontal scalp and preseptal hematoma.  No underlying calvarial fracture.   Original Report Authenticated By: Waneta Martins, M.D.    Dg Pelvis Portable  03/08/2012   *RADIOLOGY REPORT*  Clinical Data: 78 year old male with pelvic pain following fall.  PORTABLE PELVIS  Comparison: 03/24/2011 and prior radiographs  Findings: There is no evidence of acute fracture, subluxation or dislocation. Heterotopic ossification along both hips are noted. Diffuse osteopenia is present. No focal bony lesions are identified. Degenerative changes in both hips are present.  IMPRESSION: No evidence of acute abnormality.  Bilateral hip degenerative changes with surrounding heterotopic ossification.   Original Report Authenticated By: Rosendo Gros, M.D.    Dg Chest Port 1 View  03/09/2012  *RADIOLOGY REPORT*  Clinical Data: Bilateral effusions.  PORTABLE CHEST - 1 VIEW  Comparison: 03/08/2012  Findings: Left pacer remains in place, unchanged.  Prior CABG. Stable right pleural effusion and right lung airspace disease. Stable cardiomegaly.  IMPRESSION: No significant change.   Original Report Authenticated By: Cyndie Chime, M.D.    Portable Chest Xray  03/08/2012  *RADIOLOGY REPORT*  Clinical Data: Fall with chest pain and shortness of breath.  PORTABLE CHEST - 1 VIEW  Comparison: 03/22/2011 chest radiograph  Findings: Cardiomegaly with evidence of previous cardiac surgery and left-sided pacemaker again noted. Pulmonary vascular congestion is present.  A right pleural effusion is present with right lower lung opacity - likely atelectasis. There is no evidence of pneumothorax or acute bony abnormality.  IMPRESSION: New right pleural effusion and right mid-lower lung opacity - question atelectasis.  Cardiomegaly with pulmonary vascular congestion.   Original Report Authenticated By: Rosendo Gros, M.D.    Dg Cerv Spine Flex&ext Only  03/09/2012  *RADIOLOGY REPORT*  Clinical Data: Larey Seat  CERVICAL SPINE - FLEXION AND EXTENSION VIEWS ONLY  Comparison: 03/11/2010  Findings: There are 3-4 mm anterolisthesis C4-5 without evidence of dynamic instability on   flexion/extension lateral radiographs.  Facet degenerative changes are suspected C3-4, C4-5, C5-6, C6-7. No definite fracture.  The cervicothoracic junction is not visualized.  Multiple missing teeth and restorations.  IMPRESSION:    3-4 mm anterolisthesis C4-5 without evidence of dynamic instability.   Original Report Authenticated By: Thora Lance III, M.D.     ASSESSMENT AND PLAN  PULMONARY  A:  Recurrent Rt pleural effusion likely related to heart failure. Hx of OSA>>followed by Dr. Shelle Iron as outpt. P:   Oxygen to keep SpO2 > 92% CPAP qhs & during sleep  CARDIOVASCULAR  ECG:  A fib Lines: PIV  A: Hx of CAD, A fib, HTN, Hyperlipidemia, Mobitz II s/p PM, chronic diastolic dysfx.  Followed Dr. Daleen Squibb as outpt. P:  Hold ASA dc xarelto>>likely will not restart Keep in even fluid balance, dc IVFs  RENAL  Lab 03/09/12 0430 03/08/12 1730  NA 139 136  K 4.4 4.4  CL 105 103  CO2 27 26  BUN 17 19  CREATININE 0.92 0.92  CALCIUM 9.0 9.1  MG -- --  PHOS -- --   Intake/Output      10/03 0701 - 10/04 0700 10/04 0701 - 10/05 0700   P.O. 480    I.V. (mL/kg) 50 (0.7)    Blood     IV Piggyback     Total Intake(mL/kg) 530 (7)    Urine (mL/kg/hr) 1305 (0.7)    Total Output 1305    Net -775          Foley:  Condom Cath 10/02>>  A:  No active issues. 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 P:   Full diet per speech recomm  HEMATOLOGIC  Lab 03/10/12 0650 03/09/12 0430 03/08/12 2117 03/08/12 1730  HGB 7.8* 7.4* -- 8.1*  HCT 25.8* 24.0* -- 26.3*  PLT 154 149* -- 151  INR 1.25 1.38 1.28 1.90*  APTT -- -- -- 40*   A:  Iron deficiency anemia 2nd to malabsorption after hemigastrectomy>>has been getting frequent transfusions and IV iron as outpt. Coagulopathy 2nd to Xarelto. P:  F/u CBC/ INR - expect xarelto to have gone away by now Transfuse for Hb < 7 FFP per rapid  reversal protocol for xarelto, more only if INR 1.5 or above  INFECTIOUS  Lab 03/10/12 0650 03/09/12 0430 03/08/12 1730  WBC 7.6 6.4 7.3  PROCALCITON -- -- --   Cultures: None Antibiotics: None  A: No evidence for infection. P:   Monitor clinically  ENDOCRINE  A:  No issues  NEUROLOGIC  CT head 10/02>>3 x 1.7 cm Rt frontal hemorrhage, chronic ischemic changes CT face 10/02>>soft tissue swelling/laceration Rt frontal and orbital region  A: Traumatic Rt frontal hemorrhage. P:   F/u CT head stable, noneurosurgical intervention needed Hold effexor for now  BEST PRACTICE /  DISPOSITION Level of Care:  ICU - tr outt o SDu in 24h Primary Service:  PCCM Consultants:  Neurosurgery Wynetta Emery) Code Status: Full Diet: NPO DVT Px: SCD GI Px: Protonix  Updated family at bedside.  Cyril Mourning MD. Tonny Bollman. [facility] Pager [phone] If no response call [phone]

## 2012-03-10 NOTE — Progress Notes (Signed)
Physical Therapy Treatment Patient Details Name: Eric [name] MRN: [id number] DOB: 11/15/1925 Today's Date: 03/10/2012 Time: 1340-1403 PT Time Calculation (min): 23 min  PT Assessment / Plan / Recommendation Comments on Treatment Session  pt having a greater problem with initiation and perseveration of ideas, ? motor planning problems.  Change disposition to CIR    Follow Up Recommendations  Post acute inpatient rehab    Barriers to Discharge        Equipment Recommendations  Other (comment) (TBD)    Recommendations for Other Services Rehab consult  Frequency     Plan Discharge plan needs to be updated;Frequency remains appropriate    Precautions / Restrictions Precautions Precautions: Fall Restrictions Weight Bearing Restrictions: No   Pertinent Vitals/Pain     Mobility  Bed Mobility Bed Mobility: Supine to Sit;Sitting - Scoot to Edge of Bed Supine to Sit: 3: Mod assist;HOB elevated Sitting - Scoot to Edge of Bed: 3: Mod assist Details for Bed Mobility Assistance: vc's for technique, repetition due to perseveration and decr initiation, truncal assist Transfers Transfers: Sit to Stand;Stand to Sit Sit to Stand: 3: Mod assist Stand to Sit: 3: Mod assist Details for Transfer Assistance: vc's for hand placement; steadying assist and assist to come forward Ambulation/Gait Ambulation/Gait Assistance: 1: +2 Total assist Ambulation/Gait: Patient Percentage: 60% Ambulation Distance (Feet): 75 Feet Assistive device: Other (Comment) (rolling Oxygen caddy) Ambulation/Gait Assistance Details: short shuffled, staggery steps with flexed posture; vc's for postural checks and improving step length; truncal stability, postural checks Gait Pattern: Step-through pattern;Decreased step length - right;Decreased step length - left;Decreased stride length;Shuffle;Trunk flexed Gait velocity: slow cadence Stairs: No Wheelchair Mobility Wheelchair Mobility: No    Exercises     PT  Diagnosis:    PT Problem List:   PT Treatment Interventions:     PT Goals Acute Rehab PT Goals Time For Goal Achievement: 03/23/12 Potential to Achieve Goals: Good PT Goal: Supine/Side to Sit - Progress: Progressing toward goal PT Goal: Sit to Stand - Progress: Progressing toward goal PT Transfer Goal: Bed to Chair/Chair to Bed - Progress: Progressing toward goal PT Goal: Ambulate - Progress: Progressing toward goal  Visit Information  Last PT Received On: 03/10/12    Subjective Data  Subjective: Okay, I will   Cognition  Overall Cognitive Status: Impaired Area of Impairment: Attention;Following commands;Problem solving;Executive functioning Arousal/Alertness: Awake/alert Behavior During Session: [facility] for tasks performed Current Attention Level: Sustained Following Commands: Follows one step commands inconsistently Executive Functioning: trouble with initiation and also with perseveration of ideas    Balance     End of Session PT - End of Session Equipment Utilized During Treatment: Gait belt Activity Tolerance: Patient tolerated treatment well Patient left: in chair;with call bell/phone within reach;with family/visitor present Nurse Communication: Mobility status   GP     Taliesin Hartlage, Eliseo Gum 03/10/2012, 2:24 PM

## 2012-03-11 LAB — CBC
HCT: 24.3 % — ABNORMAL LOW (ref 39.0–52.0)
Platelets: 159 10*3/uL (ref 150–400)
RDW: 20.6 % — ABNORMAL HIGH (ref 11.5–15.5)
WBC: 7.2 10*3/uL (ref 4.0–10.5)

## 2012-03-11 LAB — BASIC METABOLIC PANEL
Chloride: 105 mEq/L (ref 96–112)
Creatinine, Ser: 1.02 mg/dL (ref 0.50–1.35)
GFR calc Af Amer: 76 mL/min — ABNORMAL LOW (ref >90)

## 2012-03-11 LAB — PROTIME-INR: INR: 1.29 (ref 0.00–1.49)

## 2012-03-11 NOTE — Progress Notes (Signed)
Name: Eric [name] MRN: [id number] DOB: 05-06-1926    LOS: 3  Referring Provider:  [facility] ER Reason for Referral:  ICH  PULMONARY / CRITICAL CARE MEDICINE  HPI:  78 yo male transferred from [facility] 03/08/2012 after fall with traumatic Rt frontal ICH.  He is on xarelto for chronic a fib.  PCCM asked to admit to ICU.   Events Since Admission: 10/3 head CT >>no significant change, c-collar cleared   Current Status: C/o mild headache.  Mild rotator cuff tear on R shoulder films   Vital Signs: Temp:  [98.3 F (36.8 C)-99 F (37.2 C)] 98.5 F (36.9 C) (10/05 0400) Pulse Rate:  [58-70] 61  (10/05 0800) Resp:  [15-28] 18  (10/05 0800) BP: (114-165)/(54-99) 121/66 mmHg (10/05 0800) SpO2:  [93 %-100 %] 98 % (10/05 0800) Weight:  [74.6 kg (164 lb 7.4 oz)] 74.6 kg (164 lb 7.4 oz) (10/05 0500)  Physical Examination: General - no distress HEENT - Peri-orbital ecchymosis on Rt with surrounding edema, bruising over Rt face Cardiac - irregular, no murmur, pulses symmetric Chest - decreased breath sounds Rt bases, no wheeze Abd - soft, decreased bowel sounds, non-tender, no masses GU - condom cath in place, no lesions Ext - no edema Skin - raised, red rash Rt anterior thigh Neuro - awake, follows commands, moves all extremities, non focal   ASSESSMENT AND PLAN  PULMONARY  A:  Recurrent Rt pleural effusion likely related to heart failure. Hx of OSA>>followed by Dr. Shelle Iron as outpt. P:   Oxygen to keep SpO2 > 92% CPAP qhs & during sleep  CARDIOVASCULAR  ECG:  A fib Lines: PIV  A: Hx of CAD, A fib, HTN, Hyperlipidemia, Mobitz II s/p PM, chronic diastolic dysfx.  Followed Dr. Daleen Squibb as outpt. P:  Hold ASA dc xarelto>>likely will not restart Keep in even fluid balance  RENAL  Lab 03/11/12 0420 03/10/12 0650 03/09/12 0430 03/08/12 1730  NA 141 140 139 136  K 3.5 3.9 -- --  CL 105 104 105 103  CO2 28 27 27 26   BUN 21 20 17 19   CREATININE 1.02 0.99 0.92 0.92  CALCIUM 9.0 9.2  9.0 9.1  MG -- -- -- --  PHOS -- -- -- --   Intake/Output      10/04 0701 - 10/05 0700 10/05 0701 - 10/06 0700   P.O. 360    I.V. (mL/kg)     Total Intake(mL/kg) 360 (4.8)    Urine (mL/kg/hr) 1345 (0.8)    Total Output 1345    Net -985         Urine Occurrence 1 x     Foley:  Condom Cath 10/02>>  A:  No active issues. 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 P:   Full diet per speech recomm  HEMATOLOGIC  Lab 03/11/12 0420 03/10/12 0650 03/09/12 0430 03/08/12 2117 03/08/12 1730  HGB 7.3* 7.8* 7.4* -- 8.1*  HCT 24.3* 25.8* 24.0* -- 26.3*  PLT 159 154 149* -- 151  INR 1.29 1.25 1.38 1.28 1.90*  APTT -- -- -- -- 40*   A:  Iron deficiency anemia 2nd to malabsorption after hemigastrectomy>>has been getting frequent transfusions and IV iron as outpt. Coagulopathy 2nd to Xarelto. P:  F/u CBC    Transfuse for Hb < 7  INFECTIOUS  Lab 03/11/12 0420 03/10/12 0650 03/09/12 0430 03/08/12 1730  WBC  7.2 7.6 6.4 7.3  PROCALCITON -- -- -- --   Cultures: None Antibiotics: None  A: No evidence for infection. P:   Monitor clinically  ENDOCRINE  A:  No issues  NEUROLOGIC  CT head 10/04>>3 x 1.7 cm Rt frontal hemorrhage, chronic ischemic changes>>unchanged  A: Traumatic Rt frontal hemorrhage. P:   F/u CT head stable, noneurosurgical intervention needed Hold effexor for now  BEST PRACTICE / DISPOSITION Level of Care:  ICU - tr outt o SDu today 1-/5 Primary Service:  PCCM Consultants:  Neurosurgery Wynetta Emery) Code Status: Full Diet: NPO DVT Px: SCD GI Px: Protonix  Updated family at bedside.  Dorcas Carrow Beeper  [phone]  Cell  [phone]  If no response or cell goes to voicemail, call beeper [phone]

## 2012-03-11 NOTE — Progress Notes (Signed)
Placed patient on home cpap unit. Patient is tolerating setting well with 2lpm 02. RT will continue to monitor.

## 2012-03-11 NOTE — Progress Notes (Signed)
Subjective: Patient reports Is feeling better no significant headache no nausea no vomiting he's much clearer a good nice rest with the BiPAP  Objective: Vital signs in last 24 hours: Temp:  [98.3 F (36.8 C)-99 F (37.2 C)] 98.5 F (36.9 C) (10/05 0400) Pulse Rate:  [58-70] 61  (10/05 0800) Resp:  [15-28] 18  (10/05 0800) BP: (114-165)/(54-99) 121/66 mmHg (10/05 0800) SpO2:  [93 %-100 %] 98 % (10/05 0800) Weight:  [74.6 kg (164 lb 7.4 oz)] 74.6 kg (164 lb 7.4 oz) (10/05 0500)  Intake/Output from previous day: 10/04 0701 - 10/05 0700 In: 360 [P.O.:360] Out: 1345 [Urine:1345] Intake/Output this shift:    Awake alert and oriented nonfocal incision a scalp looks like healing well  Lab Results:  Basename 03/11/12 0420 03/10/12 0650  WBC 7.2 7.6  HGB 7.3* 7.8*  HCT 24.3* 25.8*  PLT 159 154   BMET  Basename 03/11/12 0420 03/10/12 0650  NA 141 140  K 3.5 3.9  CL 105 104  CO2 28 27  GLUCOSE 112* 119*  BUN 21 20  CREATININE 1.02 0.99  CALCIUM 9.0 9.2    Studies/Results: Ct Head Wo Contrast  03/10/2012  *RADIOLOGY REPORT*  Clinical Data: 78 year old male status post fall with intracranial hemorrhage.  CT HEAD WITHOUT CONTRAST  Technique:  Contiguous axial images were obtained from the base of the skull through the vertex without contrast.  Comparison: 03/09/2012 and earlier.  Findings: Paranasal sinuses and mastoids remain clear.  Stable right scalp hematoma.  No acute orbit soft tissue findings. Stable visualized osseous structures.  Calcified atherosclerosis at the skull base.  Stable multi focal right anterior frontal lobe hemorrhagic contusions.  There may be trace extra-axial blood along the right anterior frontal convexity as before.  Stable minimal mass effect.  Trace intraventricular hemorrhage.  No ventriculomegaly.  Probable trace subarachnoid hemorrhage along the posterior right cerebellum is stable.  Basilar cisterns are patent.  Confluent white matter hypodensity.   No new cortically based infarct.  Intracranial artery dolichoectasia.  IMPRESSION: 1.  Stable right anterior frontal lobe hemorrhagic contusions and trace extra-axial and intraventricular hemorrhage. 2.  No new intracranial abnormality.   Original Report Authenticated By: Harley Hallmark, M.D.    Ct Shoulder Right Wo Contrast  03/10/2012  *RADIOLOGY REPORT*  Clinical Data: Right shoulder injury.  Possible fracture.  CT OF THE RIGHT SHOULDER WITHOUT CONTRAST  Technique:  Multidetector CT imaging was performed according to the standard protocol. Multiplanar CT image reconstructions were also generated.  Comparison: Radiographs 03/10/2012.  Findings: No fracture of the humeral head or neck is identified. The scapula is intact and the clavicle is intact.  There is extensive chondrocalcinosis involving the glenohumeral joint and AC joint.  Other soft tissue calcifications are noted which are likely in the synovium.  Findings are most likely due to CPPD arthropathy. Synovial osteochondromatosis and HADD are less likely.  There is a moderate sized joint effusion.  A large full-thickness retracted rotator cuff tear is likely given the marked narrowing of the humeral acromial space and fluid in the subacromial bursa.  Noted in the visualized portion of the right lung is a large loculated appearing right pleural effusion. Small pulmonary nodules are also noted.  Dedicated chest CT is recommended for further evaluation these findings.  IMPRESSION:  1.  Extensive calcifications in the glenohumeral joint and AC joint suggesting chondrocalcinosis and probable CPPD. 2.  Suspect large retracted rotator cuff tear. 3.  No acute humeral head or neck fracture. 4.  Advanced glenohumeral and AC joint degenerative changes. 5.  Large right pleural effusion and small pulmonary nodules. Recommend dedicated chest CT for further evaluation.   Original Report Authenticated By: P. Loralie Champagne, M.D.    Dg Cerv Spine Flex&ext  Only  03/09/2012  *RADIOLOGY REPORT*  Clinical Data: Larey Seat  CERVICAL SPINE - FLEXION AND EXTENSION VIEWS ONLY  Comparison: 03/11/2010  Findings: There are 3-4 mm anterolisthesis C4-5 without evidence of dynamic instability on   flexion/extension lateral radiographs. Facet degenerative changes are suspected C3-4, C4-5, C5-6, C6-7. No definite fracture.  The cervicothoracic junction is not visualized.  Multiple missing teeth and restorations.  IMPRESSION:    3-4 mm anterolisthesis C4-5 without evidence of dynamic instability.   Original Report Authenticated By: Osa Craver, M.D.    Dg Shoulder Right Port  03/10/2012  *RADIOLOGY REPORT*  Clinical Data: Right shoulder pain status post fall.  PORTABLE RIGHT SHOULDER - 2+ VIEW  Comparison: Chest CT of 03/11/2010.  No dedicated shoulder films are available.  Findings: AP and attempted scapular Y views, portable.  There is moderate degenerative irregularity of the acromioclavicular joint. The humeral head is high riding, consistent with chronic rotator cuff insufficiency.  There is osseous irregularity about the superolateral aspect of the humeral head.  Moderate glenohumeral joint osteoarthritis.  Imaged right hemithorax demonstrates interstitial edema and a right- sided pleural effusion.  IMPRESSION:  1.  Degraded two-view exam secondary patient positioning. 2.  Extensive chronic degenerative irregularity of the acromioclavicular and glenohumeral joints.  Osseous irregularity of the humeral head which could be degenerative.  Impaction fracture cannot be excluded.  This could be more entirely characterized with non portable films or depending on clinical suspicion, CT.   Original Report Authenticated By: Consuello Bossier, M.D.     Assessment/Plan: Post injury day 3 progressing well stable CT scan of his head with slight retraction of the right frontal lobe and the sural hemorrhage CT of his shoulder showed mild degenerative changes and labral rotator cuff  tears however some this is old and chronic form certainly no evidence of acute fracture so as needed we can arrange outpatient follow up with orthopedics. Patient be transferred to the floor would recommend continued physical and occupational therapy  LOS: 3 days     Leodan Bolyard P 03/11/2012, 8:34 AM

## 2012-03-12 LAB — BASIC METABOLIC PANEL
CO2: 26 mEq/L (ref 19–32)
Calcium: 8.9 mg/dL (ref 8.4–10.5)
GFR calc Af Amer: 87 mL/min — ABNORMAL LOW (ref >90)
GFR calc non Af Amer: 75 mL/min — ABNORMAL LOW (ref >90)
Sodium: 141 mEq/L (ref 135–145)

## 2012-03-12 LAB — CBC
Platelets: 175 10*3/uL (ref 150–400)
RBC: 2.96 MIL/uL — ABNORMAL LOW (ref 4.22–5.81)
WBC: 8 10*3/uL (ref 4.0–10.5)

## 2012-03-12 LAB — PREPARE RBC (CROSSMATCH)

## 2012-03-12 NOTE — Progress Notes (Signed)
Subjective: Stable overnight.  Still mildly confused, but mostly appropriate with answers.  Family concerned about po intake.  Wearing cpap at HS  Objective: Vital signs in last 24 hours: Blood pressure 147/70, pulse 63, temperature 98 F (36.7 C), temperature source Oral, resp. rate 20, height 5\' 11"  (1.803 m), weight 74.6 kg (164 lb 7.4 oz), SpO2 99.00%.  Intake/Output from previous day: 10/05 0701 - 10/06 0700 In: 120 [P.O.:120] Out: 800 [Urine:800]   Physical Exam:   wd male in nad No skin breakdown or pressure necrosis from cpap mask Chest with decreased bs due to decreased depth of inspiration.  No significant crackles of wheezing Cor with regular rhythm, paced on telem LE without edema, no cyanosis Awake, mildly confused, moves all 4.    Lab Results:  Basename 03/12/12 0445 03/11/12 0420 03/10/12 0650  WBC 8.0 7.2 7.6  HGB 7.3* 7.3* 7.8*  HCT 24.0* 24.3* 25.8*  PLT 175 159 154   BMET  Basename 03/12/12 0445 03/11/12 0420 03/10/12 0650  NA 141 141 140  K 3.6 3.5 3.9  CL 106 105 104  CO2 26 28 27   GLUCOSE 115* 112* 119*  BUN 22 21 20   CREATININE 0.94 1.02 0.99  CALCIUM 8.9 9.0 9.2    Studies/Results: Ct Head Wo Contrast  03/10/2012  *RADIOLOGY REPORT*  Clinical Data: 78 year old male status post fall with intracranial hemorrhage.  CT HEAD WITHOUT CONTRAST  Technique:  Contiguous axial images were obtained from the base of the skull through the vertex without contrast.  Comparison: 03/09/2012 and earlier.  Findings: Paranasal sinuses and mastoids remain clear.  Stable right scalp hematoma.  No acute orbit soft tissue findings. Stable visualized osseous structures.  Calcified atherosclerosis at the skull base.  Stable multi focal right anterior frontal lobe hemorrhagic contusions.  There may be trace extra-axial blood along the right anterior frontal convexity as before.  Stable minimal mass effect.  Trace intraventricular hemorrhage.  No ventriculomegaly.  Probable  trace subarachnoid hemorrhage along the posterior right cerebellum is stable.  Basilar cisterns are patent.  Confluent white matter hypodensity.  No new cortically based infarct.  Intracranial artery dolichoectasia.  IMPRESSION: 1.  Stable right anterior frontal lobe hemorrhagic contusions and trace extra-axial and intraventricular hemorrhage. 2.  No new intracranial abnormality.   Original Report Authenticated By: Harley Hallmark, M.D.    Ct Shoulder Right Wo Contrast  03/10/2012  *RADIOLOGY REPORT*  Clinical Data: Right shoulder injury.  Possible fracture.  CT OF THE RIGHT SHOULDER WITHOUT CONTRAST  Technique:  Multidetector CT imaging was performed according to the standard protocol. Multiplanar CT image reconstructions were also generated.  Comparison: Radiographs 03/10/2012.  Findings: No fracture of the humeral head or neck is identified. The scapula is intact and the clavicle is intact.  There is extensive chondrocalcinosis involving the glenohumeral joint and AC joint.  Other soft tissue calcifications are noted which are likely in the synovium.  Findings are most likely due to CPPD arthropathy. Synovial osteochondromatosis and HADD are less likely.  There is a moderate sized joint effusion.  A large full-thickness retracted rotator cuff tear is likely given the marked narrowing of the humeral acromial space and fluid in the subacromial bursa.  Noted in the visualized portion of the right lung is a large loculated appearing right pleural effusion. Small pulmonary nodules are also noted.  Dedicated chest CT is recommended for further evaluation these findings.  IMPRESSION:  1.  Extensive calcifications in the glenohumeral joint and AC joint suggesting  chondrocalcinosis and probable CPPD. 2.  Suspect large retracted rotator cuff tear. 3.  No acute humeral head or neck fracture. 4.  Advanced glenohumeral and AC joint degenerative changes. 5.  Large right pleural effusion and small pulmonary nodules. Recommend  dedicated chest CT for further evaluation.   Original Report Authenticated By: P. Loralie Champagne, M.D.     Assessment/Plan: Patient Active Hospital Problem List     OBSTRUCTIVE SLEEP APNEA (01/22/2008) Pt is wearing cpap, and having no issues.    Atrial fibrillation (03/19/2010) No ongoing cardiac issues currently  Frontal hemorrhagic contusion The pt is mildly confused, and is being followed by neuro.  Will need to watch po intake, renal function, and may require supplemental fluids or nutrition if remains poor.  Anemia-chronic Monitor Hgb       Barbaraann Share, M.D. 03/12/2012, 11:24 AM

## 2012-03-12 NOTE — Progress Notes (Signed)
Subjective: Patient reports feeling better  Objective: Vital signs in last 24 hours: Temp:  [97.8 F (36.6 C)-98.7 F (37.1 C)] 98.7 F (37.1 C) (10/06 0250) Pulse Rate:  [58-63] 63  (10/05 2300) Resp:  [17-21] 20  (10/05 2300) BP: (121-137)/(48-69) 137/69 mmHg (10/05 2300) SpO2:  [94 %-99 %] 99 % (10/05 2300)  Intake/Output from previous day: 10/05 0701 - 10/06 0700 In: 120 [P.O.:120] Out: 600 [Urine:600] Intake/Output this shift:    sleeping - easily arouses -  FC all 4,  Scalp lac stable  Lab Results:  [facility] 03/12/12 0445 03/11/12 0420  WBC 8.0 7.2  HGB 7.3* 7.3*  HCT 24.0* 24.3*  PLT 175 159   BMET  Basename 03/12/12 0445 03/11/12 0420  NA 141 141  K 3.6 3.5  CL 106 105  CO2 26 28  GLUCOSE 115* 112*  BUN 22 21  CREATININE 0.94 1.02  CALCIUM 8.9 9.0    Studies/Results: Ct Head Wo Contrast  03/10/2012  *RADIOLOGY REPORT*  Clinical Data: 78 year old male status post fall with intracranial hemorrhage.  CT HEAD WITHOUT CONTRAST  Technique:  Contiguous axial images were obtained from the base of the skull through the vertex without contrast.  Comparison: 03/09/2012 and earlier.  Findings: Paranasal sinuses and mastoids remain clear.  Stable right scalp hematoma.  No acute orbit soft tissue findings. Stable visualized osseous structures.  Calcified atherosclerosis at the skull base.  Stable multi focal right anterior frontal lobe hemorrhagic contusions.  There may be trace extra-axial blood along the right anterior frontal convexity as before.  Stable minimal mass effect.  Trace intraventricular hemorrhage.  No ventriculomegaly.  Probable trace subarachnoid hemorrhage along the posterior right cerebellum is stable.  Basilar cisterns are patent.  Confluent white matter hypodensity.  No new cortically based infarct.  Intracranial artery dolichoectasia.  IMPRESSION: 1.  Stable right anterior frontal lobe hemorrhagic contusions and trace extra-axial and intraventricular  hemorrhage. 2.  No new intracranial abnormality.   Original Report Authenticated By: Harley Hallmark, M.D.    Ct Shoulder Right Wo Contrast  03/10/2012  *RADIOLOGY REPORT*  Clinical Data: Right shoulder injury.  Possible fracture.  CT OF THE RIGHT SHOULDER WITHOUT CONTRAST  Technique:  Multidetector CT imaging was performed according to the standard protocol. Multiplanar CT image reconstructions were also generated.  Comparison: Radiographs 03/10/2012.  Findings: No fracture of the humeral head or neck is identified. The scapula is intact and the clavicle is intact.  There is extensive chondrocalcinosis involving the glenohumeral joint and AC joint.  Other soft tissue calcifications are noted which are likely in the synovium.  Findings are most likely due to CPPD arthropathy. Synovial osteochondromatosis and HADD are less likely.  There is a moderate sized joint effusion.  A large full-thickness retracted rotator cuff tear is likely given the marked narrowing of the humeral acromial space and fluid in the subacromial bursa.  Noted in the visualized portion of the right lung is a large loculated appearing right pleural effusion. Small pulmonary nodules are also noted.  Dedicated chest CT is recommended for further evaluation these findings.  IMPRESSION:  1.  Extensive calcifications in the glenohumeral joint and AC joint suggesting chondrocalcinosis and probable CPPD. 2.  Suspect large retracted rotator cuff tear. 3.  No acute humeral head or neck fracture. 4.  Advanced glenohumeral and AC joint degenerative changes. 5.  Large right pleural effusion and small pulmonary nodules. Recommend dedicated chest CT for further evaluation.   Original Report Authenticated By: P. MARK  Pecolia Ades, M.D.    Dg Shoulder Right Port  03/10/2012  *RADIOLOGY REPORT*  Clinical Data: Right shoulder pain status post fall.  PORTABLE RIGHT SHOULDER - 2+ VIEW  Comparison: Chest CT of 03/11/2010.  No dedicated shoulder films are available.   Findings: AP and attempted scapular Y views, portable.  There is moderate degenerative irregularity of the acromioclavicular joint. The humeral head is high riding, consistent with chronic rotator cuff insufficiency.  There is osseous irregularity about the superolateral aspect of the humeral head.  Moderate glenohumeral joint osteoarthritis.  Imaged right hemithorax demonstrates interstitial edema and a right- sided pleural effusion.  IMPRESSION:  1.  Degraded two-view exam secondary patient positioning. 2.  Extensive chronic degenerative irregularity of the acromioclavicular and glenohumeral joints.  Osseous irregularity of the humeral head which could be degenerative.  Impaction fracture cannot be excluded.  This could be more entirely characterized with non portable films or depending on clinical suspicion, CT.   Original Report Authenticated By: Consuello Bossier, M.D.     Assessment/Plan: Pt stable - cont tx's  LOS: 4 days     Keyston Ardolino R, MD 03/12/2012, 6:07 AM

## 2012-03-13 DIAGNOSIS — S0083XA Contusion of other part of head, initial encounter: Secondary | ICD-10-CM

## 2012-03-13 DIAGNOSIS — W19XXXA Unspecified fall, initial encounter: Secondary | ICD-10-CM

## 2012-03-13 DIAGNOSIS — S069X9A Unspecified intracranial injury with loss of consciousness of unspecified duration, initial encounter: Secondary | ICD-10-CM

## 2012-03-13 DIAGNOSIS — R911 Solitary pulmonary nodule: Secondary | ICD-10-CM

## 2012-03-13 LAB — BASIC METABOLIC PANEL
CO2: 25 mEq/L (ref 19–32)
Calcium: 8.9 mg/dL (ref 8.4–10.5)
Chloride: 108 mEq/L (ref 96–112)
GFR calc Af Amer: 87 mL/min — ABNORMAL LOW (ref >90)
Sodium: 141 mEq/L (ref 135–145)

## 2012-03-13 LAB — CBC
HCT: 26.1 % — ABNORMAL LOW (ref 39.0–52.0)
Hemoglobin: 8 g/dL — ABNORMAL LOW (ref 13.0–17.0)
MCH: 24.8 pg — ABNORMAL LOW (ref 26.0–34.0)
MCHC: 30.7 g/dL (ref 30.0–36.0)
MCV: 81.1 fL (ref 78.0–100.0)
Platelets: 175 K/uL (ref 150–400)
RBC: 3.22 MIL/uL — ABNORMAL LOW (ref 4.22–5.81)
RDW: 19.9 % — ABNORMAL HIGH (ref 11.5–15.5)
WBC: 7 K/uL (ref 4.0–10.5)

## 2012-03-13 MED ORDER — MAGNESIUM HYDROXIDE 400 MG/5ML PO SUSP
15.0000 mL | Freq: Every day | ORAL | Status: AC
  Administered 2012-03-13 – 2012-03-14 (×2): 15 mL via ORAL
  Filled 2012-03-13 (×2): qty 30

## 2012-03-13 MED ORDER — POTASSIUM CHLORIDE CRYS ER 20 MEQ PO TBCR
40.0000 meq | EXTENDED_RELEASE_TABLET | Freq: Once | ORAL | Status: AC
  Administered 2012-03-13: 40 meq via ORAL
  Filled 2012-03-13 (×2): qty 1

## 2012-03-13 MED ORDER — SENNOSIDES-DOCUSATE SODIUM 8.6-50 MG PO TABS
1.0000 | ORAL_TABLET | Freq: Two times a day (BID) | ORAL | Status: DC
  Administered 2012-03-13 – 2012-03-14 (×3): 1 via ORAL
  Filled 2012-03-13 (×4): qty 1

## 2012-03-13 NOTE — Consult Note (Signed)
Physical Medicine and Rehabilitation Consult Reason for Consult: TBI with right frontal ICH Referring Physician:  Dr. Molli Knock.    HPI: Eric [name] is a 78 y.o. male  With history of A Fib on xarelto who sustained a ground level fall and struck his head against the curb on 03/08/12. Taken to [facility] where xrays done revealed R-frontal ICH. He was transferred to [facility] and evaluated by Dr. Wynetta Emery who recommended monitoring MS and serial CCT. MS has been stable. Last CT with stable right frontal hemorrhagic contusions. Right shoulder CT with suspicion of large retracted RTC tear with advanced degenerative changes as well as incidental note of large R-pleural effusion and small pulmonary nodules. Pulmonary feels patient with recurrent effusion likely due to heart failure.  He has had mild confusion and poor po intake reported. ST evaluation revealed mild oral transit delay and patient on D3, thin liquids.  PT evaluation done and patient with deficits in mobility. CIR consulted for progression.   Review of Systems  Eyes: Negative for blurred vision and double vision.  Respiratory: Negative for cough and shortness of breath.   Cardiovascular: Negative for chest pain and palpitations.  Musculoskeletal: Positive for back pain (chronic).       History of unsteady gait--doesn't use AD. Bilateral RTC tears.   Neurological: Negative for headaches.  Psychiatric/Behavioral: Positive for memory loss.   Past Medical History  Diagnosis Date  . BPH (benign prostatic hypertrophy)   . DJD (degenerative joint disease)   . Hx of transient ischemic attack (TIA)   . AAA (abdominal aortic aneurysm)   . Cerebrovascular disease   . HLD (hyperlipidemia)   . HTN (hypertension)   . Second degree AV block, Mobitz type II     s/p PPM  . CAD (coronary artery disease)   . Persistent atrial fibrillation     on pradaxa  . Sleep apnea     cpap machine  . Gastric ulcer, acute 02/2011    multiple - NSAID/Pradaxa thought  causative. associated gastroparesis-like problems  . Small bowel obstruction     multiple over the years  . Anemia   . Anemia    Past Surgical History  Procedure Date  . Pacemaker insertion 01/24/10    Adapta Medtronic   . Coronary artery bypass graft   . Appendectomy   . Gastrectomy   . Knee arthroscopy     right, with medial and lateral meniscal  . Finger surgery      Left middle finger dorsal lesion  . Upper gastrointestinal endoscopy 03/12/2011    stomach ulcer, esophageal erosion  . Esophagogastroduodenoscopy 01/13/2012    Procedure: ESOPHAGOGASTRODUODENOSCOPY (EGD);  Surgeon: Iva Boop, MD;  Location: [facility];  Service: Endoscopy;  Laterality: N/A;  . Flexible sigmoidoscopy 01/13/2012    Procedure: FLEXIBLE SIGMOIDOSCOPY;  Surgeon: Iva Boop, MD;  Location: [facility];  Service: Endoscopy;  Laterality: N/A;  Fleets enema on arrival   Family History  Problem Relation Age of Onset  . Coronary artery disease Father   . Colon cancer Brother    Social History: Married. Independent and active PTA-goes to the gym 4 days/wk and runs a hosiery mill.  Niece/nephew help out.  Wife reports that he quit smoking about 43 years ago. His smoking use included Cigarettes. He has a 5 pack-year smoking history. He has never used smokeless tobacco. He drinks a glass of wine occasionally. He does not use illicit drugs. Wife supportive.   Allergies  Allergen Reactions  . Morphine  Other (See Comments)    Reaction unknown  . Penicillins Other (See Comments)    Reaction unknown  . Promethazine Hcl Other (See Comments)    Change in behavior   Medications Prior to Admission  Medication Sig Dispense Refill  . aspirin 81 MG tablet Take 1 tablet (81 mg total) by mouth daily.      Marland Kitchen docusate sodium (COLACE) 100 MG capsule Take 100 mg by mouth daily.        . finasteride (PROSCAR) 5 MG tablet Take 5 mg by mouth daily.      . furosemide (LASIX) 20 MG tablet Take 1 tablet (20 mg total) by  mouth daily.  30 tablet  7  . Multiple Vitamin (MULTIVITAMINS PO) Take 1 tablet by mouth daily.        . pantoprazole (PROTONIX) 40 MG tablet Take 40 mg by mouth daily.      . potassium chloride SA (K-DUR,KLOR-CON) 20 MEQ tablet Take 20 mEq by mouth daily.      . Rivaroxaban (XARELTO) 20 MG TABS Take 1 tablet (20 mg total) by mouth daily. CONTINUE TO HOLD THIS MEDICATION UNTIL YOU HEAR FROM DR. WALL  30 tablet  6  . simvastatin (ZOCOR) 20 MG tablet Take 1 tablet (20 mg total) by mouth every evening.  30 tablet  11  . Tamsulosin HCl (FLOMAX) 0.4 MG CAPS Take 1 capsule (0.4 mg total) by mouth daily after supper.  30 capsule  2  . venlafaxine XR (EFFEXOR-XR) 37.5 MG 24 hr capsule Take 37.5 mg by mouth daily. Increase to 75mg  after 30 days        Home: Home Living Lives With: Spouse Available Help at Discharge: Family Type of Home: House Home Access: Stairs to enter Secretary/administrator of Steps: 1 Entrance Stairs-Rails: Right;Left Home Layout: One level Bathroom Shower/Tub: Other (comment) (walk-in tub) Bathroom Toilet: Handicapped height Bathroom Accessibility: Yes How Accessible: Accessible via walker Home Adaptive Equipment: Bedside commode/3-in-1;Grab bars around toilet;Grab bars in shower;Walker - rolling;Straight cane  Functional History: Prior Function Able to Take Stairs?:  (difficulty with stairs lately) Driving: No Vocation: Part time employment Functional Status:  Mobility: Bed Mobility Bed Mobility: Supine to Sit Supine to Sit: 1: +2 Total assist;HOB elevated (HOB 30 degrees.) Supine to Sit: Patient Percentage: 50% Sitting - Scoot to Edge of Bed: 3: Mod assist Transfers Transfers: Sit to Stand;Stand to Sit (2 trials.) Sit to Stand: 1: +2 Total assist;With upper extremity assist;From bed;From chair/3-in-1 Sit to Stand: Patient Percentage: 60% Stand to Sit: 1: +2 Total assist;With upper extremity assist;To chair/3-in-1 Stand to Sit: Patient Percentage:  60% Ambulation/Gait Ambulation/Gait Assistance: 1: +2 Total assist Ambulation/Gait: Patient Percentage: 60% Ambulation Distance (Feet): 100 Feet (60 feet and 40 feet.) Assistive device: Rolling walker Ambulation/Gait Assistance Details: Assist for balance to keep weight shifted anterior over BOS. Assist to also advance/direct RW. Cues for tall posture and safety with RW. Gait Pattern: Step-through pattern;Decreased step length - right;Decreased step length - left;Decreased stride length;Shuffle;Trunk flexed Gait velocity: slow cadence Stairs: No Wheelchair Mobility Wheelchair Mobility: No  ADL: ADL Grooming: Simulated;Moderate assistance Where Assessed - Grooming: Supported sitting Upper Body Dressing: Simulated;Moderate assistance Where Assessed - Upper Body Dressing: Supported sitting Lower Body Dressing: Performed;Maximal assistance Where Assessed - Lower Body Dressing: Supported sit to Pharmacist, hospital: Simulated;Moderate assistance Toilet Transfer Method: Sit to Barista:  (to/from chair) Equipment Used: Gait belt;Rolling walker Transfers/Ambulation Related to ADLs: Pt +2total A(pt=60%) with RW ambulation into hall and  back. Pt fatigues easily. 02 remained stable in the 90's (on 2L 02). ADL Comments: Slow processing/difficulty initiating (per PT worse than eval)  Cognition: Cognition Arousal/Alertness: Awake/alert Orientation Level: Oriented to person;Disoriented to place;Disoriented to time Cognition Overall Cognitive Status: Impaired Area of Impairment: Attention;Following commands;Problem solving;Executive functioning Arousal/Alertness: Awake/alert Orientation Level: Appears intact for tasks assessed Behavior During Session: [facility] for tasks performed Current Attention Level: Sustained Following Commands: Follows one step commands with increased time Problem Solving: Difficulty problem solving. Executive Functioning: Decreased initiation  throughout session.  Blood pressure 157/79, pulse 61, temperature 98.1 F (36.7 C), temperature source Oral, resp. rate 13, height 5\' 11"  (1.803 m), weight 74.6 kg (164 lb 7.4 oz), SpO2 93.00%. Physical Exam  Nursing note and vitals reviewed. Constitutional: He appears well-developed and well-nourished.  HENT:  Head: Normocephalic.       Ecchymotic area right forehead down to check. Hematoma/scabbed abrasion on right eyebrow.   Eyes: Pupils are equal, round, and reactive to light.  Neck: Normal range of motion.  Cardiovascular: Normal rate and regular rhythm.   Pulmonary/Chest: Effort normal. He has rales in the left lower field.  Abdominal: Soft. Bowel sounds are normal.  Musculoskeletal:       Diffuse ecchymosis right shoulder and upper arm with decreased ROM at shoulder. Right hip/thigh with large hematoma--tender to touch.   Neurological: He is alert.       Delayed processing with decreased verbal output. Unable to state date today or DOB. Perseveration noted.  Very poor insight and awareness.  Difficulty mobilizing RLE/RUE due to pain especially RUE. There appeared to be no neurological loss of strength on the left.    Psychiatric: His affect is blunt. He is slowed. Cognition and memory are impaired.    Results for orders placed during the hospital encounter of 03/08/12 (from the past 24 hour(s))  TYPE AND SCREEN     Status: Normal   Collection Time   03/12/12  5:05 PM      Component Value Range   ABO/RH(D) A POS     Antibody Screen NEG     Sample Expiration 03/15/2012     Unit Number [id number]     Blood Component Type RED CELLS,LR     Unit division 00     Status of Unit ISSUED,FINAL     Transfusion Status OK TO TRANSFUSE     Crossmatch Result Compatible    PREPARE RBC (CROSSMATCH)     Status: Normal   Collection Time   03/12/12  5:05 PM      Component Value Range   Order Confirmation ORDER PROCESSED BY BLOOD BANK    BASIC METABOLIC PANEL     Status: Abnormal    Collection Time   03/13/12  5:05 AM      Component Value Range   Sodium 141  135 - 145 mEq/L   Potassium 3.4 (*) 3.5 - 5.1 mEq/L   Chloride 108  96 - 112 mEq/L   CO2 25  19 - 32 mEq/L   Glucose, Bld 106 (*) 70 - 99 mg/dL   BUN 27 (*) 6 - 23 mg/dL   Creatinine, Ser 1.19  0.50 - 1.35 mg/dL   Calcium 8.9  8.4 - 14.7 mg/dL   GFR calc non Af Amer 75 (*) >90 mL/min   GFR calc Af Amer 87 (*) >90 mL/min  CBC     Status: Abnormal   Collection Time   03/13/12  5:05 AM      Component  Value Range   WBC 7.0  4.0 - 10.5 K/uL   RBC 3.22 (*) 4.22 - 5.81 MIL/uL   Hemoglobin 8.0 (*) 13.0 - 17.0 g/dL   HCT 16.1 (*) 09.6 - 04.5 %   MCV 81.1  78.0 - 100.0 fL   MCH 24.8 (*) 26.0 - 34.0 pg   MCHC 30.7  30.0 - 36.0 g/dL   RDW 40.9 (*) 81.1 - 91.4 %   Platelets 175  150 - 400 K/uL   No results found.  Assessment/Plan: Diagnosis: Right frontal contusions after fall 1. Does the need for close, 24 hr/day medical supervision in concert with the patient's rehab needs make it unreasonable for this patient to be served in a less intensive setting? Yes 2. Co-Morbidities requiring supervision/potential complications: OSA, af fib, cad, DJD, BPH 3. Due to bladder management, bowel management, safety, skin/wound care, disease management, medication administration, pain management and patient education, does the patient require 24 hr/day rehab nursing? Yes 4. Does the patient require coordinated care of a physician, rehab nurse, PT (1-2 hrs/day, 5 days/week), OT (1-2 hrs/day, 5 days/week) and SLP (1-2 hrs/day, 5 days/week) to address physical and functional deficits in the context of the above medical diagnosis(es)? Yes Addressing deficits in the following areas: balance, endurance, locomotion, strength, transferring, bowel/bladder control, bathing, dressing, feeding, grooming, toileting, cognition, speech, language, swallowing and psychosocial support 5. Can the patient actively participate in an intensive therapy  program of at least 3 hrs of therapy per day at least 5 days per week? Yes 6. The potential for patient to make measurable gains while on inpatient rehab is excellent 7. Anticipated functional outcomes upon discharge from inpatient rehab are minimal assist with PT, minimal to mod assist with OT, minimal assist with SLP. 8. Estimated rehab length of stay to reach the above functional goals is: 2 weeks 9. Does the patient have adequate social supports to accommodate these discharge functional goals? Yes 10. Anticipated D/C setting: Home 11. Anticipated post D/C treatments: HH therapy 12. Overall Rehab/Functional Prognosis: excellent  RECOMMENDATIONS: This patient's condition is appropriate for continued rehabilitative care in the following setting: CIR Patient has agreed to participate in recommended program. Yes Note that insurance prior authorization may be required for reimbursement for recommended care.  Comment:Rehab RN to follow up.   Ivory Broad, MD     03/13/2012

## 2012-03-13 NOTE — Progress Notes (Signed)
Speech Language Pathology Dysphagia Treatment Patient Details Name: Eric [name] MRN: [id number] DOB: [birth year] Today's Date: 03/13/2012 Time: 0108-0116 SLP Time Calculation (min): 8 min  Assessment / Plan / Recommendation Clinical Impression  Pt. seen for dysphagia treatment focusing on PO trials of upgraded textures. Per Pt. and his wife, he has not experienced difficulties with Dys. 3 diet or thin liquids. With cracker, Pt. able to masticate bolus effectively, though mastication is slightly prolonged. No s/s of aspiration observed at bedside. Given improved ability to masticate bolus and absent s/s of aspiration, recommend continuing thin liquids and advancing diet to regular consistency.  SLP also recommends inpatient rehab to assess and determine any cognitive-linguistic impairments. As Pt. is likely transferring to inpatient rehab,  defer  dysphagia treatment to next venue of care.     Diet Recommendation  Continue with Current Diet: Thin liquid Initiate / Change Diet: Regular;Thin liquid    SLP Plan Discharge SLP treatment due to (comment) (trnasferring to inpatient rehab)      Swallowing Goals  SLP Swallowing Goals Patient will consume recommended diet without observed clinical signs of aspiration with: Minimal cueing Swallow Study Goal #1 - Progress: Progressing toward goal Patient will utilize recommended strategies during swallow to increase swallowing safety with: Minimal cueing Swallow Study Goal #2 - Progress: Progressing toward goal  General Temperature Spikes Noted: No Respiratory Status: Room air Behavior/Cognition: Alert;Cooperative;Pleasant mood Oral Cavity - Dentition: Dentures, top Patient Positioning: Upright in chair  Oral Cavity - Oral Hygiene Does patient have any of the following "at risk" factors?: None of the above Patient is HIGH RISK - Oral Care Protocol followed (see row info): Yes Patient is AT RISK - Oral Care Protocol followed (see row info): Yes    Dysphagia Treatment Treatment focused on: Skilled observation of diet tolerance;Upgraded PO texture trials;Patient/family/caregiver education Family/Caregiver Educated: wife Treatment Methods/Modalities: Skilled observation Patient observed directly with PO's: Yes Type of PO's observed: Thin liquids;Regular Feeding: Able to feed self Liquids provided via: Cup;Straw Oral Phase Signs & Symptoms: Prolonged mastication Type of cueing: Verbal Amount of cueing: Minimal   GO     Eric [name] 03/13/2012, 3:09 PM

## 2012-03-13 NOTE — Progress Notes (Signed)
NSL rt hand removed per protocol.  Site unremarkable, cath removed intact. Attempted to restart NSL left hand unsuccessful.  Site no bleeding unremarkable.  IV therapy notified to restart.

## 2012-03-13 NOTE — Progress Notes (Signed)
Utilization review completed.  

## 2012-03-13 NOTE — Progress Notes (Signed)
Name: Eric [name] MRN: [id number] DOB: 02-16-1926    LOS: 5  Referring Provider:  [facility] ER Reason for Referral:  [facility]  PULMONARY / CRITICAL CARE MEDICINE  HPI:  78 yo male transferred from [facility] 03/08/2012 after fall with traumatic Rt frontal ICH.  He is on xarelto for chronic a fib.  PCCM asked to admit to ICU.   Events Since Admission: 10/3 head CT >>no significant change, c-collar cleared   Current Status: C/o mild headache.  Mild rotator cuff tear on R shoulder films   Vital Signs: Temp:  [97.4 F (36.3 C)-98.6 F (37 C)] 98.1 F (36.7 C) (10/07 0741) Pulse Rate:  [61-70] 61  (10/07 0336) Resp:  [13-27] 13  (10/07 0336) BP: (103-157)/(53-79) 157/79 mmHg (10/07 0336) SpO2:  [88 %-100 %] 93 % (10/07 0800)  Physical Examination: General - no distress HEENT - Peri-orbital ecchymosis on Rt with surrounding edema, bruising over Rt face Cardiac - irregular, no murmur, pulses symmetric Chest - decreased breath sounds Rt bases, no wheeze Abd - soft, decreased bowel sounds, non-tender, no masses GU - condom cath in place, no lesions Ext - no edema Skin - raised, red rash Rt anterior thigh Neuro - awake, follows commands, moves all extremities, non focal   ASSESSMENT AND PLAN  PULMONARY  A:  Recurrent Rt pleural effusion likely related to heart failure. Hx of OSA>>followed by Dr. Shelle Iron as outpt. P:   Oxygen to keep SpO2 > 92% CPAP qhs & during sleep  CARDIOVASCULAR  ECG:  A fib Lines: PIV  A: Hx of CAD, A fib, HTN, Hyperlipidemia, Mobitz II s/p PM, chronic diastolic dysfx.  Followed Dr. Daleen Squibb as outpt. P:  Hold ASA dc xarelto>>likely will not restart Keep in even fluid balance  RENAL  Lab 03/13/12 0505 03/12/12 0445 03/11/12 0420 03/10/12 0650 03/09/12 0430  NA 141 141 141 140 139  K 3.4* 3.6 -- -- --  CL 108 106 105 104 105  CO2 25 26 28 27 27   BUN 27* 22 21 20 17   CREATININE 0.92 0.94 1.02 0.99 0.92  CALCIUM 8.9 8.9 9.0 9.2 9.0  MG -- -- -- -- --    PHOS -- -- -- -- --   Intake/Output      10/06 0701 - 10/07 0700 10/07 0701 - 10/08 0700   P.O. 320 100   I.V. (mL/kg) 250 (3.4)    Blood 315    Total Intake(mL/kg) 885 (11.9) 100 (1.3)   Urine (mL/kg/hr) 650 (0.4) 700 (2.1)   Total Output 650 700   Net +235 -600        Urine Occurrence 2 x      Intake/Output Summary (Last 24 hours) at 03/13/12 1132 Last data filed at 03/13/12 1000  Gross per 24 hour  Intake    785 ml  Output   1250 ml  Net   -465 ml   Foley:  Condom Cath 10/02>>  A:  No active issues. 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 P:   Full diet per speech recomm Will perform calorie count and consult nutrition to suggest supplement if needed.  HEMATOLOGIC  Lab 03/13/12 0505 03/12/12 0445 03/11/12 0420 03/10/12 0650 03/09/12 0430 03/08/12 2117 03/08/12 1730  HGB 8.0* 7.3* 7.3* 7.8* 7.4* -- --  HCT 26.1* 24.0* 24.3* 25.8* 24.0* -- --  PLT 175 175 159 154 149* -- --  INR -- -- 1.29 1.25 1.38 1.28 1.90*  APTT -- -- -- -- -- -- 40*   A:  Iron deficiency anemia 2nd to malabsorption after hemigastrectomy>>has been getting frequent transfusions and IV iron as outpt. Coagulopathy 2nd to Xarelto. P:  F/u CBC    Transfuse for Hb < 7  INFECTIOUS  Lab 03/13/12 0505 03/12/12 0445 03/11/12 0420 03/10/12 0650 03/09/12 0430  WBC 7.0 8.0 7.2 7.6 6.4  PROCALCITON -- -- -- -- --   Cultures: None Antibiotics: None  A: No evidence for infection. P:   Monitor clinically  ENDOCRINE  A:  No issues  NEUROLOGIC  CT head 10/04>>3 x 1.7 cm Rt frontal hemorrhage, chronic ischemic changes>>unchanged  A: Traumatic Rt frontal hemorrhage. P:   F/u CT head stable, no neurosurgical intervention needed. Hold effexor for now. Will have PT/OT work with patient and once ready will transfer to inpatient rehab prior to d/c home.    Updated family at bedside.  Alyson Reedy, M.D. [facility] Pulmonary/Critical Care Medicine. Pager: [phone]. After hours pager: [phone].

## 2012-03-13 NOTE — Progress Notes (Signed)
Subjective: Patient reports Patient while no headache no significant nausea denies any numbness in his arms or his legs  Objective: Vital signs in last 24 hours: Temp:  [97.4 F (36.3 C)-98.6 F (37 C)] 98.1 F (36.7 C) (10/07 1229) Pulse Rate:  [61-70] 61  (10/07 0336) Resp:  [13-27] 13  (10/07 0336) BP: (103-157)/(53-79) 157/79 mmHg (10/07 0336) SpO2:  [93 %-100 %] 93 % (10/07 0800)  Intake/Output from previous day: 10/06 0701 - 10/07 0700 In: 885 [P.O.:320; I.V.:250; Blood:315] Out: 650 [Urine:650] Intake/Output this shift: Total I/O In: 100 [P.O.:100] Out: 700 [Urine:700]  Awake alert oriented strength out of 5 significant decrease in swelling and ecchymoses around his right eye  Lab Results:  Basename 03/13/12 0505 03/12/12 0445  WBC 7.0 8.0  HGB 8.0* 7.3*  HCT 26.1* 24.0*  PLT 175 175   BMET  Basename 03/13/12 0505 03/12/12 0445  NA 141 141  K 3.4* 3.6  CL 108 106  CO2 25 26  GLUCOSE 106* 115*  BUN 27* 22  CREATININE 0.92 0.94  CALCIUM 8.9 8.9    Studies/Results: No results found.  Assessment/Plan: Patient recovered well and convalescing well plan is for transfer to rehabilitation unit when a bed is available  LOS: 5 days     Latesia Norrington P 03/13/2012, 3:12 PM

## 2012-03-13 NOTE — Progress Notes (Signed)
Patient will benefit from an inpt rehab admission prior to d/c home with his wife. Please clarify when medically ready to d/c to CIR  ? Tuesday or Wed. I spoke with his wife at bedside and she is in agreement. I will follow up in the morning. [phone]

## 2012-03-13 NOTE — Clinical Documentation Improvement (Signed)
Abnormal Labs Clarification  THIS DOCUMENT IS NOT A PERMANENT PART OF THE MEDICAL RECORD  TO RESPOND TO THE THIS QUERY, FOLLOW THE INSTRUCTIONS BELOW:  1. If needed, update documentation for the patient's encounter via the notes activity.  2. Access this query again and click edit on the [employer].  3. After updating, or not, click F2 to complete all highlighted (required) fields concerning your review. Select "additional documentation in the medical record" OR "no additional documentation provided".  4. Click Sign note button.  5. The deficiency will fall out of your InBasket *Please let us know if you are not able to complete this workflow by phone or e-mail (listed below).  Please update your documentation within the medical record to reflect your response to this query.                                                                                   03/13/12  Dear Dr.  Delford Field  Marton Redwood  In a better effort to capture your patient's severity of illness, reflect appropriate length of stay and utilization of resources, a review of the medical record has revealed the following indicators.    Abnormal findings (laboratory, x-ray, pathologic, and other diagnostic results) are not coded and reported unless the physician indicates their clinical significance.   The medical record reflects the following clinical findings, please clarify the diagnostic and/or clinical significance:      NOTED ON 10/2 CT HEAD W/O CONTRAST:   " IMPRESSION: Three foci of intraparenchymal hemorrhage within the right frontal lobe, the largest of which measures 2.7 x 2.0 cm.  Mild local edema."    PLEASE INDICATE IN NOTES/DC SUMMARY IF THE FOLLOWING IS APPROPRIATE SECONDARY DIAGNOSIS: - mild vasogenic edema - mild local intracranial edema - mild local frontal lobe edema - Other Condition (please specify) - Cannot Clinically Determine   Reviewed:  no additional documentation provided   Thank  You,  Beverley Fiedler RN Clinical Documentation Specialist: Pager:  [phone] Health Information Management:  [phone] [facility]

## 2012-03-13 NOTE — PMR Pre-admission (Signed)
PMR Admission Coordinator Pre-Admission Assessment  Patient: Eric [name] is an 78 y.o., male MRN: [id number] DOB: 06-23-1925 Height: 5\' 11"  (180.3 cm) Weight: 76 kg (167 lb 8.8 oz)  Insurance Information HMO:     PPO:      PCP:      IPA:      80/20: yes     OTHER: no HMO PRIMARY: Medicare a and b      Policy#: [id number]      Subscriber: pt CM Name:       Phone#:      Fax#:  Pre-Cert#:       Employer:  Benefits:  Phone #: [employer]     Name: 03/13/12 Eff. Date: a 01/05/93 b 12/05/93     Deduct: $1184      Out of Pocket Max: none      Life Max: none CIR: 100%      SNF: 20 full days LBD Outpatient: 80%     Co-Pay: 20% Home Health: 100%      Co-Pay: no copay DME: 80%     Co-Pay: 20% Providers: pt choice  SECONDARY: [employer] of [place]      Policy#: [id number]      Subscriber: pt No auth needed for medicare primary  Emergency Contact Information Contact Information    Name Relation Home Work Mobile   [place]" [place] [phone]  [phone]     Current Medical History  Patient Admitting Diagnosis: Right frontal contusions after fall  History of Present Illness: Eric [name] is a 78 y.o. male With history of A Fib on xarelto who sustained a ground level fall and struck his head against the curb on 03/08/12. Taken to [facility] where xrays done revealed R-frontal ICH. He was transferred to [facility] and evaluated by Dr. Wynetta Emery who recommended monitoring MS and serial CCT. MS has been stable. Last CT with stable right frontal hemorrhagic contusions. Right shoulder CT with suspicion of large retracted RTC tear with advanced degenerative changes as well as incidental note of large R-pleural effusion and small pulmonary nodules. Pulmonary feels patient with recurrent effusion likely due to heart failure. He has had mild confusion and poor po intake reported. ST evaluation revealed mild oral transit delay and patient on D3, thin liquids.    Past Medical History  Past Medical History  Diagnosis Date   . BPH (benign prostatic hypertrophy)   . DJD (degenerative joint disease)   . Hx of transient ischemic attack (TIA)   . AAA (abdominal aortic aneurysm)   . Cerebrovascular disease   . HLD (hyperlipidemia)   . HTN (hypertension)   . Second degree AV block, Mobitz type II     s/p PPM  . CAD (coronary artery disease)   . Persistent atrial fibrillation     on pradaxa  . Sleep apnea     cpap machine  . Gastric ulcer, acute 02/2011    multiple - NSAID/Pradaxa thought causative. associated gastroparesis-like problems  . Small bowel obstruction     multiple over the years  . Anemia   . Anemia     Family History  family history includes Colon cancer in his brother and Coronary artery disease in his father.  Prior Rehab/Hospitalizations: Went to [facility] in [place] For SNF rehab for 2 weeks in the past year. Goes to the cardiac rehab or gym daily five times per week  Current Medications  Current facility-administered medications:acetaminophen (TYLENOL) tablet 650 mg, 650 mg, Oral, Q4H PRN, Donzetta Sprung  Wynetta Emery, MD, 650 mg at 03/13/12 1027;  diphenhydrAMINE (BENADRYL) capsule 25 mg, 25 mg, Oral, Once, Gaylord Shih, MD, 25 mg at 03/14/12 1610;  docusate sodium (COLACE) capsule 100 mg, 100 mg, Oral, BID, Gaylord Shih, MD, 100 mg at 03/14/12 9604;  enalapril (VASOTEC) tablet 2.5 mg, 2.5 mg, Oral, BID, Gaylord Shih, MD, 2.5 mg at 03/14/12 5409 finasteride (PROSCAR) tablet 5 mg, 5 mg, Oral, Daily, Oretha Milch, MD, 5 mg at 03/14/12 8119;  furosemide (LASIX) 10 MG/ML injection, , , , ;  furosemide (LASIX) injection 20 mg, 20 mg, Intravenous, Once, Gaylord Shih, MD, 20 mg at 03/14/12 1228;  furosemide (LASIX) tablet 20 mg, 20 mg, Oral, Daily, Oretha Milch, MD, 20 mg at 03/14/12 1478 magnesium hydroxide (MILK OF MAGNESIA) suspension 15 mL, 15 mL, Oral, Daily, Alyson Reedy, MD, 15 mL at 03/14/12 0940;  neomycin-bacitracin-polymyxin (NEOSPORIN) ointment, , Topical, PRN, Storm Frisk, MD, 1  application at 03/10/12 1030;  pantoprazole (PROTONIX) EC tablet 40 mg, 40 mg, Oral, Q1200, Oretha Milch, MD, 40 mg at 03/14/12 1316 senna-docusate (Senokot-S) tablet 1 tablet, 1 tablet, Oral, BID, Alyson Reedy, MD, 1 tablet at 03/14/12 2956;  simvastatin (ZOCOR) tablet 20 mg, 20 mg, Oral, QPM, Oretha Milch, MD, 20 mg at 03/13/12 1702;  sodium phosphate (FLEET) 7-19 GM/118ML enema 1 enema, 1 enema, Rectal, Once, Gaylord Shih, MD;  venlafaxine XR (EFFEXOR-XR) 24 hr capsule 37.5 mg, 37.5 mg, Oral, Q breakfast, Gaylord Shih, MD, 37.5 mg at 03/14/12 2130  Patients Current Diet: Dysphagia 3 and thin liquids 10/7 SLP recommends advancing diet to regular consistency. 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 Wife picks him up midday and they go to cardiac rehab or gym 5 times per week. They then go home, eat lunch and then he naps for one hr. Returns to work and stay about two hrs before return home. Wife states may have minor memory issues pta. He relies on her for his meds administration. Over the past year patient has been hospitalized twice due to stomach issues. Wife has been building him up strength wise and "babying" him. She shaves him, lays out his clothes, he dresses himself with her assisting for lower body dressing.Marland Kitchen Has scoliosis and arthritis so walks without AD, but hunched over  with a unsteady gait.  Current Functional Level Cognition  Arousal/Alertness: Awake/alert Overall Cognitive Status: Impaired Current Attention Level: Sustained Orientation Level: Oriented to person Following Commands: Follows one step commands with increased time Executive Functioning: Decreased initiation throughout session.    Extremity Assessment (includes Sensation/Coordination)  RUE ROM/Strength/Tone: Deficits;Unable to fully assess;Due to pain;Due to impaired cognition RUE ROM/Strength/Tone Deficits: Pt with h/o RC injury and unable to lift UE greater than 80degrees. Elbow and distally, strenght appears WFL- unsure about grasp RUE Sensation: WFL - Light Touch RUE Coordination: Deficits  RLE ROM/Strength/Tone: WFL for tasks assessed    ADLs  Grooming: Simulated;Moderate assistance Where Assessed - Grooming:  Supported sitting Upper Body Dressing: Simulated;Moderate assistance Where Assessed - Upper Body Dressing: Supported sitting Lower Body Dressing: Performed;Maximal assistance Where Assessed - Lower Body Dressing: Supported sit to Pharmacist, hospital: Simulated;Moderate assistance Toilet Transfer Method: Sit to Barista:  (to/from chair) Toileting - Architect and Hygiene: Simulated;Maximal assistance Where Assessed - Engineer, mining and Hygiene: Sit to stand from 3-in-1 or toilet Equipment Used: Gait belt;Rolling walker Transfers/Ambulation Related to ADLs: Pt +2total A(pt=60%) with RW ambulation into hall and back. Pt fatigues easily. 02 remained stable in the 90's (on 2L 02). ADL Comments: Slow processing/difficulty initiating (per PT worse than eval)    Mobility  Bed Mobility: Supine to Sit Supine to Sit: 1: +2 Total assist;HOB elevated (HOB 30 degrees.) Supine to Sit: Patient Percentage: 50% Sitting - Scoot to Edge of Bed: 3: Mod assist    Transfers  Transfers: Sit to Stand;Stand to Sit (2 trials.) Sit to  Stand: 1: +2 Total assist;With upper extremity assist;From bed;From chair/3-in-1 Sit to Stand: Patient Percentage: 60% Stand to Sit: 1: +2 Total assist;With upper extremity assist;To chair/3-in-1 Stand to Sit: Patient Percentage: 60%    Ambulation / Gait / Stairs / Wheelchair Mobility  Ambulation/Gait Ambulation/Gait Assistance: 1: +2 Total assist Ambulation/Gait: Patient Percentage: 60% Ambulation Distance (Feet): 100 Feet (60 feet and 40 feet.) Assistive device: Rolling walker Ambulation/Gait Assistance Details: Assist for balance to keep weight shifted anterior over BOS. Assist to also advance/direct RW. Cues for tall posture and safety with RW. Gait Pattern: Step-through pattern;Decreased step length - right;Decreased step length - left;Decreased stride length;Shuffle;Trunk flexed Gait velocity: slow cadence Stairs: No Wheelchair Mobility Wheelchair Mobility: No    Posture / Balance Static Sitting Balance Static Sitting - Balance Support: Right upper extremity supported;Left upper extremity supported;Feet supported Static Sitting - Level of Assistance: 4: Min Oncologist Standing - Balance Support: Bilateral upper extremity supported;During functional activity Static Standing - Level of Assistance: 4: Min assist     Previous Home Environment Living Arrangements: Spouse/significant other Lives With: Spouse Available Help at Discharge: Family Type of Home: House Home Layout: One level Home Access: Stairs to enter Entrance Stairs-Rails: Doctor, general practice of Steps: 1 Bathroom Shower/Tub: Other (comment) (walk-in tub) Bathroom Toilet: Handicapped height Bathroom Accessibility: Yes How Accessible: Accessible via walker Home Care Services: No  Discharge Living Setting Plans for Discharge Living Setting: Patient's home;Lives with (comment) (wife of 28 years) Type of Home at Discharge: House Discharge Home Layout: One level Discharge  Home Access: Stairs to enter Entrance Stairs-Rails: Right Entrance Stairs-Number of Steps: 1 step Discharge Bathroom Shower/Tub: Other (comment) (walk in tub) Discharge Bathroom Toilet: Handicapped height Discharge Bathroom Accessibility: Yes How Accessible: Accessible via walker Do you have any problems obtaining your medications?: No  Social/Family/Support Systems Patient Roles: Spouse;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 They have no children Anticipated Caregiver's Contact Information: as above Ability/Limitations of Caregiver: wife 40 years old. wears b afos. They workout daily at gym ot cardiac rehab Caregiver Availability: 24/7 Discharge Plan Discussed with Primary Caregiver: Yes Is Caregiver In Agreement with Plan?: Yes Does Caregiver/Family have Issues with Lodging/Transportation while Pt is in Rehab?: No Wife has peripheral neuropathy.   Goals/Additional Needs Patient/Family Goal for Rehab: min PT, Min to mod OT, min SLP Expected length of stay: [facility] 10 days Special Service Needs: while pt in hospital wife pays sitter 10 pm until 8 am,  neice 8 am till 10 am, wife all day until 7 pm, nephew 7 pm until 10 pm Pt/Family Agrees to Admission and willing to participate: Yes Program Orientation Provided & Reviewed with Pt/Caregiver Including Roles  & Responsibilities: Yes Additional Information Needs: wif ewants pt home asap Wife's goal is for patient to be more ambulatory and less confused and then home asap. NO SNF,  Patient Condition: This patient's condition remains as documented in the Consult dated 03/13/12, in which the Rehabilitation Physician determined and documented that the patient's condition is appropriate for intensive rehabilitative care in an inpatient rehabilitation facility.  Preadmission Screen Completed By:  Clois Dupes,  03/14/2012 1:37 PM ______________________________________________________________________   Discussed status with Dr. Riley Kill on 03/14/12 at  1338 and received telephone approval for admission today.  Admission Coordinator:  Clois Dupes, time 9604 Date 03/14/12.

## 2012-03-13 NOTE — Progress Notes (Signed)
Physical Therapy Treatment Patient Details Name: Eric [name] MRN: [id number] DOB: 1926-06-02 Today's Date: 03/13/2012 Time: 4098-1191 PT Time Calculation (min): 30 min  PT Assessment / Plan / Recommendation Comments on Treatment Session  Pt admitted s/p fall with right frontal ICH. Pt progressing with PT able to increase activity tolerance today. Progress made in ambulation distance.    Follow Up Recommendations  Post acute inpatient     Does the patient have the potential to tolerate intense rehabilitation  Yes, Recommend IP Rehab Screening  Barriers to Discharge        Equipment Recommendations  None recommended by PT    Recommendations for Other Services    Frequency Min 4X/week   Plan Frequency remains appropriate;Discharge plan remains appropriate    Precautions / Restrictions Precautions Precautions: Fall Restrictions Weight Bearing Restrictions: No   Pertinent Vitals/Pain None    Mobility  Bed Mobility Bed Mobility: Supine to Sit Supine to Sit: 1: +2 Total assist;HOB elevated (HOB 30 degrees.) Supine to Sit: Patient Percentage: 50% Details for Bed Mobility Assistance: Assist to translate trunk anterior over BOS as well as move bilateral LEs off EOB. Assist also for initiation. Cues for sequence and safety. Transfers Transfers: Sit to Stand;Stand to Sit (2 trials.) Sit to Stand: 1: +2 Total assist;With upper extremity assist;From bed;From chair/3-in-1 Sit to Stand: Patient Percentage: 60% Stand to Sit: 1: +2 Total assist;With upper extremity assist;To chair/3-in-1 Details for Transfer Assistance: Assist to translate trunk anterior over BOS with cues for safest hand placement and to keep trunk anterior. Tendency to increase posterior lean. Ambulation/Gait Ambulation/Gait Assistance: 1: +2 Total assist Ambulation/Gait: Patient Percentage: 60% Ambulation Distance (Feet): 100 Feet (60 feet and 40 feet.) Assistive device: Rolling walker Ambulation/Gait Assistance  Details: Assist for balance to keep weight shifted anterior over BOS. Assist to also advance/direct RW. Cues for tall posture and safety with RW. Gait Pattern: Step-through pattern;Decreased step length - right;Decreased step length - left;Decreased stride length;Shuffle;Trunk flexed Gait velocity: slow cadence Stairs: No Wheelchair Mobility Wheelchair Mobility: No    Exercises     PT Diagnosis:    PT Problem List:   PT Treatment Interventions:     PT Goals Acute Rehab PT Goals PT Goal Formulation: With patient Time For Goal Achievement: 03/23/12 Potential to Achieve Goals: Good PT Goal: Supine/Side to Sit - Progress: Progressing toward goal PT Goal: Sit to Stand - Progress: Progressing toward goal PT Goal: Ambulate - Progress: Progressing toward goal  Visit Information  Last PT Received On: 03/13/12 Assistance Needed: +2    Subjective Data  Subjective: "I would love to do some walking." Patient Stated Goal: home independent   Cognition  Overall Cognitive Status: Impaired Area of Impairment: Attention;Following commands;Problem solving;Executive functioning Arousal/Alertness: Awake/alert Orientation Level: Appears intact for tasks assessed Behavior During Session: [facility] for tasks performed Current Attention Level: Sustained Following Commands: Follows one step commands with increased time Problem Solving: Difficulty problem solving. Executive Functioning: Decreased initiation throughout session.    Balance  Balance Balance Assessed: No  End of Session PT - End of Session Equipment Utilized During Treatment: Gait belt Activity Tolerance: Patient tolerated treatment well Patient left: in chair;with call bell/phone within reach;with family/visitor present Nurse Communication: Mobility status   GP     Eric [name] 03/13/2012, 9:43 AM  03/13/2012 Eric [name], PT, DPT [phone]

## 2012-03-13 NOTE — Clinical Social Work Psychosocial (Signed)
     Clinical Social Work Department BRIEF PSYCHOSOCIAL ASSESSMENT 03/13/2012  Patient:  Eric [name], Eric [name]     Account Number:  [id number]     Admit date:  03/08/2012  Clinical Social Worker:  Lourdes Sledge  Date/Time:  03/13/2012 11:56 AM  Referred by:  CSW  Date Referred:  03/13/2012 Referred for  SNF Placement   Other Referral:   Interview type:  Family Other interview type:   CSW completed assessment with pt spouse Eric [name] in pt room.    PSYCHOSOCIAL DATA Living Status:  WIFE Admitted from facility:   Level of care:   Primary support name:  Eric [name] [phone] Primary support relationship to patient:  SPOUSE Degree of support available:   Pt spouse present in pt room and appears very supportive. Wife will provide support if pt discharges home.    CURRENT CONCERNS Current Concerns  Post-Acute Placement   Other Concerns:    SOCIAL WORK ASSESSMENT / PLAN CSW assessed pt for possible SNF placement.    CSW visited pt room and completed assessment with pt wife and pt appears confused. CSW explored pt living situation and explained PT recommendations for inpatient rehab. CSW informed spouse of CSW involvement for possible SNF placement. CSW informed spouse that CSW will inform RNCM of [facility] requests if pt not admitted into CIR.   Assessment/plan status:  No Further Intervention Required Other assessment/ plan:   Information/referral to community resources:   CSW provided pt spouse with CSW contact information for support. Currently pt is not in need of any other resources, RNCM to follow for possible HH.    PATIENTS/FAMILYS RESPONSE TO PLAN OF CARE: Pt sitting in the recliner alert however confused. Pt wife present in the room, pleasant to speak to. Wife stated she would be very interested in CIR for her husband however would not be agreeable to SNF placement. Pt wife will have HH services come to pt home if CIR unable to accept pt. Wife had no questions for CSW. CSW  signed off.

## 2012-03-13 NOTE — Progress Notes (Signed)
SLP has reviewed and agrees with student's note below.  Gonsalo Cuthbertson Willis Keyonna Comunale M.Ed CCC-SLP Pager [phone]  03/13/2012  

## 2012-03-13 NOTE — Progress Notes (Signed)
Patient placed home cpap on and is tolerating cpap at this time. RT will continue to monitor.

## 2012-03-14 ENCOUNTER — Inpatient Hospital Stay (HOSPITAL_COMMUNITY): Payer: Medicare Other

## 2012-03-14 ENCOUNTER — Inpatient Hospital Stay (HOSPITAL_COMMUNITY)
Admission: RE | Admit: 2012-03-14 | Discharge: 2012-04-03 | DRG: 946 | Disposition: A | Discharge status: Home Health | Payer: Medicare Other | Source: Ambulatory Visit | Attending: Physical Medicine & Rehabilitation | Admitting: Physical Medicine & Rehabilitation

## 2012-03-14 DIAGNOSIS — Z7901 Long term (current) use of anticoagulants: Secondary | ICD-10-CM

## 2012-03-14 DIAGNOSIS — M1990 Unspecified osteoarthritis, unspecified site: Secondary | ICD-10-CM

## 2012-03-14 DIAGNOSIS — Z8673 Personal history of transient ischemic attack (TIA), and cerebral infarction without residual deficits: Secondary | ICD-10-CM

## 2012-03-14 DIAGNOSIS — I10 Essential (primary) hypertension: Secondary | ICD-10-CM | POA: Diagnosis present

## 2012-03-14 DIAGNOSIS — W19XXXA Unspecified fall, initial encounter: Secondary | ICD-10-CM

## 2012-03-14 DIAGNOSIS — S0633AA Contusion and laceration of cerebrum, unspecified, with loss of consciousness status unknown, initial encounter: Secondary | ICD-10-CM

## 2012-03-14 DIAGNOSIS — Z950 Presence of cardiac pacemaker: Secondary | ICD-10-CM

## 2012-03-14 DIAGNOSIS — S06330A Contusion and laceration of cerebrum, unspecified, without loss of consciousness, initial encounter: Secondary | ICD-10-CM

## 2012-03-14 DIAGNOSIS — S43429A Sprain of unspecified rotator cuff capsule, initial encounter: Secondary | ICD-10-CM

## 2012-03-14 DIAGNOSIS — Z7982 Long term (current) use of aspirin: Secondary | ICD-10-CM

## 2012-03-14 DIAGNOSIS — R5383 Other fatigue: Secondary | ICD-10-CM

## 2012-03-14 DIAGNOSIS — Z5189 Encounter for other specified aftercare: Principal | ICD-10-CM

## 2012-03-14 DIAGNOSIS — D649 Anemia, unspecified: Secondary | ICD-10-CM

## 2012-03-14 DIAGNOSIS — Z87891 Personal history of nicotine dependence: Secondary | ICD-10-CM

## 2012-03-14 DIAGNOSIS — I714 Abdominal aortic aneurysm, without rupture, unspecified: Secondary | ICD-10-CM

## 2012-03-14 DIAGNOSIS — K3184 Gastroparesis: Secondary | ICD-10-CM

## 2012-03-14 DIAGNOSIS — G4733 Obstructive sleep apnea (adult) (pediatric): Secondary | ICD-10-CM | POA: Diagnosis present

## 2012-03-14 DIAGNOSIS — F329 Major depressive disorder, single episode, unspecified: Secondary | ICD-10-CM

## 2012-03-14 DIAGNOSIS — I2510 Atherosclerotic heart disease of native coronary artery without angina pectoris: Secondary | ICD-10-CM

## 2012-03-14 DIAGNOSIS — M11239 Other chondrocalcinosis, unspecified wrist: Secondary | ICD-10-CM

## 2012-03-14 DIAGNOSIS — N400 Enlarged prostate without lower urinary tract symptoms: Secondary | ICD-10-CM

## 2012-03-14 DIAGNOSIS — K2970 Gastritis, unspecified, without bleeding: Secondary | ICD-10-CM

## 2012-03-14 DIAGNOSIS — F3289 Other specified depressive episodes: Secondary | ICD-10-CM

## 2012-03-14 DIAGNOSIS — Z951 Presence of aortocoronary bypass graft: Secondary | ICD-10-CM

## 2012-03-14 DIAGNOSIS — S069X9A Unspecified intracranial injury with loss of consciousness of unspecified duration, initial encounter: Secondary | ICD-10-CM

## 2012-03-14 DIAGNOSIS — W1809XA Striking against other object with subsequent fall, initial encounter: Secondary | ICD-10-CM

## 2012-03-14 DIAGNOSIS — M4120 Other idiopathic scoliosis, site unspecified: Secondary | ICD-10-CM

## 2012-03-14 DIAGNOSIS — Z79899 Other long term (current) drug therapy: Secondary | ICD-10-CM

## 2012-03-14 DIAGNOSIS — E785 Hyperlipidemia, unspecified: Secondary | ICD-10-CM

## 2012-03-14 DIAGNOSIS — I4891 Unspecified atrial fibrillation: Secondary | ICD-10-CM

## 2012-03-14 DIAGNOSIS — R5381 Other malaise: Secondary | ICD-10-CM

## 2012-03-14 DIAGNOSIS — Z9989 Dependence on other enabling machines and devices: Secondary | ICD-10-CM | POA: Diagnosis present

## 2012-03-14 DIAGNOSIS — I619 Nontraumatic intracerebral hemorrhage, unspecified: Secondary | ICD-10-CM | POA: Diagnosis present

## 2012-03-14 LAB — CBC
HCT: 25.4 % — ABNORMAL LOW (ref 39.0–52.0)
Hemoglobin: 7.7 g/dL — ABNORMAL LOW (ref 13.0–17.0)
MCH: 24.8 pg — ABNORMAL LOW (ref 26.0–34.0)
MCHC: 30.3 g/dL (ref 30.0–36.0)
MCV: 81.9 fL (ref 78.0–100.0)

## 2012-03-14 LAB — BASIC METABOLIC PANEL
BUN: 26 mg/dL — ABNORMAL HIGH (ref 6–23)
Calcium: 9.2 mg/dL (ref 8.4–10.5)
Creatinine, Ser: 0.84 mg/dL (ref 0.50–1.35)
GFR calc non Af Amer: 78 mL/min — ABNORMAL LOW (ref >90)
Glucose, Bld: 107 mg/dL — ABNORMAL HIGH (ref 70–99)

## 2012-03-14 MED ORDER — TRAMADOL HCL 50 MG PO TABS: 50.0000 mg | ORAL_TABLET | Freq: Four times a day (QID) | ORAL | Status: DC | PRN

## 2012-03-14 MED ORDER — ALUM & MAG HYDROXIDE-SIMETH 200-200-20 MG/5ML PO SUSP: 30.0000 mL | ORAL | Status: DC | PRN

## 2012-03-14 MED ORDER — BACITRACIN-NEOMYCIN-POLYMYXIN OINTMENT TUBE
TOPICAL_OINTMENT | CUTANEOUS | Status: DC | PRN
  Filled 2012-03-14 (×2): qty 15

## 2012-03-14 MED ORDER — ONDANSETRON HCL 4 MG/2ML IJ SOLN
4.0000 mg | Freq: Four times a day (QID) | INTRAMUSCULAR | Status: DC | PRN
  Filled 2012-03-14: qty 2

## 2012-03-14 MED ORDER — PANTOPRAZOLE SODIUM 40 MG PO TBEC
40.0000 mg | DELAYED_RELEASE_TABLET | Freq: Every day | ORAL | Status: DC
  Administered 2012-03-15 – 2012-04-02 (×19): 40 mg via ORAL
  Filled 2012-03-14 (×19): qty 1

## 2012-03-14 MED ORDER — VENLAFAXINE HCL ER 37.5 MG PO CP24
37.5000 mg | ORAL_CAPSULE | Freq: Every day | ORAL | Status: DC
  Administered 2012-03-15 – 2012-04-03 (×20): 37.5 mg via ORAL
  Filled 2012-03-14 (×23): qty 1

## 2012-03-14 MED ORDER — FUROSEMIDE 10 MG/ML IJ SOLN
INTRAMUSCULAR | Status: AC
  Filled 2012-03-14: qty 4

## 2012-03-14 MED ORDER — ENALAPRIL MALEATE 2.5 MG PO TABS
2.5000 mg | ORAL_TABLET | Freq: Two times a day (BID) | ORAL | Status: DC
  Administered 2012-03-14: 2.5 mg via ORAL
  Filled 2012-03-14 (×2): qty 1

## 2012-03-14 MED ORDER — DOCUSATE SODIUM 100 MG PO CAPS
100.0000 mg | ORAL_CAPSULE | Freq: Two times a day (BID) | ORAL | Status: DC
  Administered 2012-03-14: 100 mg via ORAL
  Filled 2012-03-14: qty 1

## 2012-03-14 MED ORDER — BOOST PLUS PO LIQD
237.0000 mL | Freq: Three times a day (TID) | ORAL | Status: DC
  Administered 2012-03-14 – 2012-03-21 (×10): 237 mL via ORAL
  Filled 2012-03-14 (×27): qty 237

## 2012-03-14 MED ORDER — FINASTERIDE 5 MG PO TABS
5.0000 mg | ORAL_TABLET | Freq: Every day | ORAL | Status: DC
  Administered 2012-03-15 – 2012-04-03 (×20): 5 mg via ORAL
  Filled 2012-03-14 (×24): qty 1

## 2012-03-14 MED ORDER — SIMVASTATIN 20 MG PO TABS
20.0000 mg | ORAL_TABLET | Freq: Every evening | ORAL | Status: DC
  Administered 2012-03-15 – 2012-04-02 (×19): 20 mg via ORAL
  Filled 2012-03-14 (×20): qty 1

## 2012-03-14 MED ORDER — MAGNESIUM CITRATE PO SOLN
1.0000 | Freq: Once | ORAL | Status: AC | PRN
  Filled 2012-03-14: qty 296

## 2012-03-14 MED ORDER — ENALAPRIL MALEATE 2.5 MG PO TABS
2.5000 mg | ORAL_TABLET | Freq: Two times a day (BID) | ORAL | Status: DC
  Administered 2012-03-14 – 2012-03-16 (×4): 2.5 mg via ORAL
  Filled 2012-03-14 (×8): qty 1

## 2012-03-14 MED ORDER — POLYETHYLENE GLYCOL 3350 17 G PO PACK
17.0000 g | PACK | Freq: Every day | ORAL | Status: DC
  Administered 2012-03-14 – 2012-04-02 (×20): 17 g via ORAL
  Filled 2012-03-14 (×24): qty 1

## 2012-03-14 MED ORDER — FLEET ENEMA 7-19 GM/118ML RE ENEM: 1.0000 | ENEMA | Freq: Once | RECTAL | Status: DC

## 2012-03-14 MED ORDER — DIPHENHYDRAMINE HCL 25 MG PO CAPS
25.0000 mg | ORAL_CAPSULE | Freq: Once | ORAL | Status: AC
  Administered 2012-03-14: 25 mg via ORAL
  Filled 2012-03-14: qty 1

## 2012-03-14 MED ORDER — GUAIFENESIN-DM 100-10 MG/5ML PO SYRP: 5.0000 mL | ORAL_SOLUTION | Freq: Four times a day (QID) | ORAL | Status: DC | PRN

## 2012-03-14 MED ORDER — FUROSEMIDE 10 MG/ML IJ SOLN
20.0000 mg | Freq: Once | INTRAMUSCULAR | Status: DC
  Filled 2012-03-14 (×2): qty 2

## 2012-03-14 MED ORDER — FUROSEMIDE 10 MG/ML IJ SOLN
20.0000 mg | Freq: Once | INTRAMUSCULAR | Status: DC
  Administered 2012-03-14: 20 mg via INTRAVENOUS

## 2012-03-14 MED ORDER — ACETAMINOPHEN 325 MG PO TABS
325.0000 mg | ORAL_TABLET | ORAL | Status: DC | PRN
  Filled 2012-03-14: qty 2

## 2012-03-14 MED ORDER — FUROSEMIDE 20 MG PO TABS
20.0000 mg | ORAL_TABLET | Freq: Every day | ORAL | Status: DC
  Administered 2012-03-15 – 2012-03-20 (×6): 20 mg via ORAL
  Filled 2012-03-14 (×10): qty 1

## 2012-03-14 MED ORDER — BISACODYL 10 MG RE SUPP: 10.0000 mg | Freq: Every day | RECTAL | Status: DC | PRN

## 2012-03-14 MED ORDER — TRAZODONE HCL 50 MG PO TABS: 25.0000 mg | ORAL_TABLET | Freq: Every evening | ORAL | Status: DC | PRN

## 2012-03-14 MED ORDER — ONDANSETRON HCL 4 MG PO TABS: 4.0000 mg | ORAL_TABLET | Freq: Four times a day (QID) | ORAL | Status: DC | PRN

## 2012-03-14 MED ORDER — DIPHENHYDRAMINE HCL 12.5 MG/5ML PO ELIX: 12.5000 mg | ORAL_SOLUTION | Freq: Four times a day (QID) | ORAL | Status: DC | PRN

## 2012-03-14 MED ORDER — VENLAFAXINE HCL ER 37.5 MG PO CP24
37.5000 mg | ORAL_CAPSULE | Freq: Every day | ORAL | Status: DC
  Administered 2012-03-14: 37.5 mg via ORAL
  Filled 2012-03-14 (×2): qty 1

## 2012-03-14 NOTE — Progress Notes (Signed)
Physical Therapy Treatment Patient Details Name: Eric [name] MRN: [id number] DOB: 1926-03-30 Today's Date: 03/14/2012 Time: 2841-3244 PT Time Calculation (min): 29 min  PT Assessment / Plan / Recommendation Comments on Treatment Session  Pt admitted s/p fall with right frontal ICH. Pt progressing with PT able to increase activity tolerance today. pt stood at EOB for pericare with min guard to min assist    Follow Up Recommendations  Post acute inpatient     Does the patient have the potential to tolerate intense rehabilitation     Barriers to Discharge        Equipment Recommendations  None recommended by PT    Recommendations for Other Services    Frequency Min 4X/week   Plan Frequency remains appropriate;Discharge plan remains appropriate    Precautions / Restrictions Precautions Precautions: Fall Restrictions Weight Bearing Restrictions: No   Pertinent Vitals/Pain     Mobility  Bed Mobility Bed Mobility: Supine to Sit;Sitting - Scoot to Edge of Bed Supine to Sit: 3: Mod assist;HOB elevated;With rails Sitting - Scoot to Edge of Bed: 3: Mod assist Details for Bed Mobility Assistance: vc's for technique; truncal assist Transfers Transfers: Sit to Stand;Stand to Sit Sit to Stand: 4: Min assist Stand to Sit: 4: Min assist Details for Transfer Assistance: vc's for hand placement; stabilitty assist Ambulation/Gait Ambulation/Gait Assistance: 4: Min assist;Other (comment) (+1 for lines) Ambulation Distance (Feet): 45 Feet Assistive device: Rolling walker Ambulation/Gait Assistance Details: short unequal steps with step to gait on L; flexed posture and trouble initiating propulsion of the RW Gait Pattern: Step-to pattern;Step-through pattern;Decreased step length - left;Decreased stride length Stairs: No Wheelchair Mobility Wheelchair Mobility: No    Exercises     PT Diagnosis:    PT Problem List:   PT Treatment Interventions:     PT Goals Acute Rehab PT  Goals Time For Goal Achievement: 03/23/12 Potential to Achieve Goals: Good PT Goal: Supine/Side to Sit - Progress: Progressing toward goal PT Goal: Sit to Stand - Progress: Progressing toward goal PT Transfer Goal: Bed to Chair/Chair to Bed - Progress: Progressing toward goal PT Goal: Ambulate - Progress: Progressing toward goal  Visit Information  Last PT Received On: 03/14/12 Assistance Needed: +2 (for lines)    Subjective Data      Cognition  Overall Cognitive Status: Appears within functional limits for tasks assessed/performed Arousal/Alertness: Awake/alert Orientation Level: Appears intact for tasks assessed Behavior During Session: [facility] for tasks performed Current Attention Level: Selective Following Commands: Follows one step commands consistently    Balance  Balance Balance Assessed: Yes Static Standing Balance Static Standing - Balance Support: Bilateral upper extremity supported;During functional activity Static Standing - Level of Assistance: 4: Min assist Static Standing - Comment/# of Minutes: during standing rest breaks  End of Session PT - End of Session Activity Tolerance: Patient tolerated treatment well Patient left: in chair;with call bell/phone within reach;with family/visitor present Nurse Communication: Mobility status   GP     Yulissa Needham, Eliseo Gum 03/14/2012, 2:51 PM  03/14/2012   Bing, PT [phone] (pager)

## 2012-03-14 NOTE — Progress Notes (Signed)
Patient ID: Eric [name], male   DOB: [birth year], 78 y.o.   MRN: [id number] Chart reviewed. Hgb still <8 at 7.7. SBP in 160 range. Will give 2 units of RBCs with 20mg  of IV Lasix. Also will restart ACEI with enalapril at 2.5mg  po bid. Effexor XR 37.5mg  also ordered, on PTA.

## 2012-03-14 NOTE — H&P (Signed)
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 male With history of A Fib on xarelto who sustained a ground level fall and struck his head against the curb on 03/08/12. Taken to [facility] where xrays done revealed R-frontal ICH. He was transferred to [facility] and evaluated by Dr. Wynetta Emery who recommended monitoring MS and serial CCT. MS has been stable. Last CT with stable right frontal hemorrhagic contusions. Right shoulder CT with suspicion of large retracted RTC tear with advanced degenerative changes as well as incidental note of large R-pleural effusion and small pulmonary nodules. Pulmonary feels patient with recurrent effusion likely due to heart failure. He has had mild confusion and poor po intake reported. ST evaluation revealed mild oral transit delay initially. No signs or symptoms of aspiration and patient advanced to regular diet. Acute on chronic anemia to be treated with 2 units PRBC today. Ace resumed by cardiology.  PT evaluation done and patient with deficits in mobility. CIR consulted for progression  Review of Systems  HENT: Negative for hearing loss.   Eyes: Negative for blurred vision and double vision.  Respiratory: Positive for shortness of breath.   Cardiovascular: Negative for chest pain and palpitations.  Gastrointestinal: Negative for heartburn, vomiting and abdominal pain.  Musculoskeletal: Positive for back pain (scoliosis with flexed posture and tended to shuffle. ) and joint pain (bilateral RTC tears. ).  Neurological: Negative for headaches.  Psychiatric/Behavioral: Positive for depression (in the past due to medical issues) and memory loss.   Past Medical History  Diagnosis Date  . BPH (benign prostatic hypertrophy)   . DJD (degenerative joint disease)   . Hx of transient ischemic attack (TIA)   . AAA (abdominal aortic aneurysm)   . Cerebrovascular disease   . HLD (hyperlipidemia)    . HTN (hypertension)   . Second degree AV block, Mobitz type II     s/p PPM  . CAD (coronary artery disease)   . Persistent atrial fibrillation     on pradaxa  . Sleep apnea     cpap machine  . Gastric ulcer, acute 02/2011    multiple - NSAID/Pradaxa thought causative. associated gastroparesis-like problems  . Small bowel obstruction     multiple over the years  . Anemia   . Anemia    Past Surgical History  Procedure Date  . Pacemaker insertion 01/24/10    Adapta Medtronic   . Coronary artery bypass graft   . Appendectomy   . Gastrectomy   . Knee arthroscopy     right, with medial and lateral meniscal  . Finger surgery      Left middle finger dorsal lesion  . Upper gastrointestinal endoscopy 03/12/2011    stomach ulcer, esophageal erosion  . Esophagogastroduodenoscopy 01/13/2012    Procedure: ESOPHAGOGASTRODUODENOSCOPY (EGD);  Surgeon: Iva Boop, MD;  Location: [facility];  Service: Endoscopy;  Laterality: N/A;  . Flexible sigmoidoscopy 01/13/2012    Procedure: FLEXIBLE SIGMOIDOSCOPY;  Surgeon: Iva Boop, MD;  Location: [facility];  Service: Endoscopy;  Laterality: N/A;  Fleets enema on arrival   Family History  Problem Relation Age of Onset  . Coronary artery disease Father   . Colon cancer Brother    Social History: Married. Independent and active PTA-goes to the gym 4 days/wk and runs a hosiery mill. Niece/nephew help out. Wife reports that he quit smoking about 43 years ago. His smoking use included Cigarettes.  He has a 5 pack-year smoking history. He has never used smokeless tobacco. He drinks a glass of wine occasionally. He does not use illicit drugs. Wife supportive  Allergies  Allergen Reactions  . Morphine Other (See Comments)    Reaction unknown  . Penicillins Other (See Comments)    Reaction unknown  . Promethazine Hcl Other (See Comments)    Change in behavior    Scheduled Meds:   . diphenhydrAMINE  25 mg Oral Once  . docusate sodium  100 mg  Oral BID  . enalapril  2.5 mg Oral BID  . finasteride  5 mg Oral Daily  . furosemide  20 mg Intravenous Once  . furosemide  20 mg Oral Daily  . magnesium hydroxide  15 mL Oral Daily  . pantoprazole  40 mg Oral Q1200  . potassium chloride  40 mEq Oral Once  . senna-docusate  1 tablet Oral BID  . simvastatin  20 mg Oral QPM  . sodium phosphate  1 enema Rectal Once  . venlafaxine XR  37.5 mg Oral Q breakfast  . DISCONTD: docusate sodium  100 mg Oral Daily   Continuous Infusions:  PRN Meds:.acetaminophen, neomycin-bacitracin-polymyxin  Medications Prior to Admission  Medication Sig Dispense Refill  . aspirin 81 MG tablet Take 1 tablet (81 mg total) by mouth daily.      Marland Kitchen docusate sodium (COLACE) 100 MG capsule Take 100 mg by mouth daily.        . finasteride (PROSCAR) 5 MG tablet Take 5 mg by mouth daily.      . furosemide (LASIX) 20 MG tablet Take 1 tablet (20 mg total) by mouth daily.  30 tablet  7  . Multiple Vitamin (MULTIVITAMINS PO) Take 1 tablet by mouth daily.        . pantoprazole (PROTONIX) 40 MG tablet Take 40 mg by mouth daily.      . potassium chloride SA (K-DUR,KLOR-CON) 20 MEQ tablet Take 20 mEq by mouth daily.      . Rivaroxaban (XARELTO) 20 MG TABS Take 1 tablet (20 mg total) by mouth daily. CONTINUE TO HOLD THIS MEDICATION UNTIL YOU HEAR FROM DR. WALL  30 tablet  6  . simvastatin (ZOCOR) 20 MG tablet Take 1 tablet (20 mg total) by mouth every evening.  30 tablet  11  . Tamsulosin HCl (FLOMAX) 0.4 MG CAPS Take 1 capsule (0.4 mg total) by mouth daily after supper.  30 capsule  2  . venlafaxine XR (EFFEXOR-XR) 37.5 MG 24 hr capsule Take 37.5 mg by mouth daily. Increase to 75mg  after 30 days        Home: Home Living Lives With: Spouse Available Help at Discharge: Family Type of Home: House Home Access: Stairs to enter Secretary/administrator of Steps: 1 Entrance Stairs-Rails: Right;Left Home Layout: One level Bathroom Shower/Tub: Other (comment) (walk-in  tub) Bathroom Toilet: Handicapped height Bathroom Accessibility: Yes How Accessible: Accessible via walker Home Adaptive Equipment: Bedside commode/3-in-1;Grab bars around toilet;Grab bars in shower;Walker - rolling;Straight cane   Functional History: Prior Function Able to Take Stairs?: Yes Driving: No Vocation:  (owns [employer]. Goes daily 2 to 3 hrs am and afternoo)  Functional Status:  Mobility: Bed Mobility Bed Mobility: Supine to Sit Supine to Sit: 1: +2 Total assist;HOB elevated (HOB 30 degrees.) Supine to Sit: Patient Percentage: 50% Sitting - Scoot to Edge of Bed: 3: Mod assist Transfers Transfers: Sit to Stand;Stand to Sit (2 trials.) Sit to Stand: 1: +2 Total assist;With upper extremity  assist;From bed;From chair/3-in-1 Sit to Stand: Patient Percentage: 60% Stand to Sit: 1: +2 Total assist;With upper extremity assist;To chair/3-in-1 Stand to Sit: Patient Percentage: 60% Ambulation/Gait Ambulation/Gait Assistance: 1: +2 Total assist Ambulation/Gait: Patient Percentage: 60% Ambulation Distance (Feet): 100 Feet (60 feet and 40 feet.) Assistive device: Rolling walker Ambulation/Gait Assistance Details: Assist for balance to keep weight shifted anterior over BOS. Assist to also advance/direct RW. Cues for tall posture and safety with RW. Gait Pattern: Step-through pattern;Decreased step length - right;Decreased step length - left;Decreased stride length;Shuffle;Trunk flexed Gait velocity: slow cadence Stairs: No Wheelchair Mobility Wheelchair Mobility: No  ADL: ADL Grooming: Simulated;Moderate assistance Where Assessed - Grooming: Supported sitting Upper Body Dressing: Simulated;Moderate assistance Where Assessed - Upper Body Dressing: Supported sitting Lower Body Dressing: Performed;Maximal assistance Where Assessed - Lower Body Dressing: Supported sit to Pharmacist, hospital: Simulated;Moderate assistance Toilet Transfer Method: Sit to Teacher, music:  (to/from chair) Equipment Used: Gait belt;Rolling walker Transfers/Ambulation Related to ADLs: Pt +2total A(pt=60%) with RW ambulation into hall and back. Pt fatigues easily. 02 remained stable in the 90's (on 2L 02). ADL Comments: Slow processing/difficulty initiating (per PT worse than eval)  Cognition: Cognition Arousal/Alertness: Awake/alert Orientation Level: Oriented to person Cognition Overall Cognitive Status: Impaired Area of Impairment: Attention;Following commands;Problem solving;Executive functioning Arousal/Alertness: Awake/alert Orientation Level: Appears intact for tasks assessed Behavior During Session:  Hospital for tasks performed Current Attention Level: Sustained Following Commands: Follows one step commands with increased time Problem Solving: Difficulty problem solving. Executive Functioning: Decreased initiation throughout session.   Blood pressure 157/80, pulse 64, temperature 97.7 F (36.5 C), temperature source Oral, resp. rate 25, height 5\' 11"  (1.803 m), weight 76 kg (167 lb 8.8 oz), SpO2 96.00%. Physical Exam  Nursing note and vitals reviewed. Constitutional: He appears well-developed and well-nourished.  HENT:  Head: Normocephalic.       Diffuse ecchymosis on frontal scalp extending to right forehead and right cheek. Minimal left periorbital ecchymosis. Right eyebrow with resolving hematoma with scabs and three sutures.   Eyes: Pupils are equal, round, and reactive to light.       Right scleral hemorrhage  Neck: Normal range of motion.  Cardiovascular: Normal rate and regular rhythm.   Murmur heard. Pulmonary/Chest: Effort normal and breath sounds normal.  Abdominal: Soft. Bowel sounds are normal.  Musculoskeletal: He exhibits no edema.       RUE with diffuse ecchymosis at shoulder and biceps area. Right hip/thigh with evidence of hematoma and tenderness mid-thigh. Diffuse ecchymosis right flank to right thigh area.   Neurological: He  is alert.       [name] Awake and alert. Poor attention.. Able to state DOB but unable to state situation, age, or wife's name even with cueing.  Quiet voice with flat affect and delayed initiation and slow movements. Pain inhibition affecting movement of RUE/RLE. Had perhaps slight difficulty with visual acuity through th right eye.   Skin: Skin is warm and dry.       Bruising and pain noted over the right hand. He has decreased grip as a result.    CBC     Status: Abnormal   Collection Time   03/14/12  5:01 AM      Component Value Range Comment   WBC 7.0  4.0 - 10.5 K/uL    RBC 3.10 (*) 4.22 - 5.81 MIL/uL    Hemoglobin 7.7 (*) 13.0 - 17.0 g/dL    HCT 16.1 (*) 09.6 - 52.0 %  MCV 81.9  78.0 - 100.0 fL    MCH 24.8 (*) 26.0 - 34.0 pg    MCHC 30.3  30.0 - 36.0 g/dL    RDW 21.3 (*) 08.6 - 15.5 %    Platelets 169  150 - 400 K/uL   BASIC METABOLIC PANEL     Status: Abnormal   Collection Time   03/14/12  5:01 AM      Component Value Range Comment   Sodium 145  135 - 145 mEq/L    Potassium 3.7  3.5 - 5.1 mEq/L    Chloride 111  96 - 112 mEq/L    CO2 25  19 - 32 mEq/L    Glucose, Bld 107 (*) 70 - 99 mg/dL    BUN 26 (*) 6 - 23 mg/dL    Creatinine, Ser 5.78  0.50 - 1.35 mg/dL    Calcium 9.2  8.4 - 46.9 mg/dL    GFR calc non Af Amer 78 (*) >90 mL/min    GFR calc Af Amer >90  >90 mL/min   MAGNESIUM     Status: Normal   Collection Time   03/14/12  5:01 AM      Component Value Range Comment   Magnesium 2.2  1.5 - 2.5 mg/dL   PHOSPHORUS     Status: Normal   Collection Time   03/14/12  5:01 AM      Component Value Range Comment   Phosphorus 3.4  2.3 - 4.6 mg/dL   PREPARE RBC (CROSSMATCH)     Status: Normal   Collection Time   03/14/12  8:00 AM      Component Value Range Comment   Order Confirmation ORDER PROCESSED BY BLOOD BANK      No results found.  Post Admission Physician Evaluation: 1. Functional deficits secondary  to traumatic brain injury. 2. Patient is  admitted to receive collaborative, interdisciplinary care between the physiatrist, rehab nursing staff, and therapy team. 3. Patient's level of medical complexity and substantial therapy needs in context of that medical necessity cannot be provided at a lesser intensity of care such as a SNF. 4. Patient has experienced substantial functional loss from his/her baseline which was documented above under the "Functional History" and "Functional Status" headings.  Judging by the patient's diagnosis, physical exam, and functional history, the patient has potential for functional progress which will result in measurable gains while on inpatient rehab.  These gains will be of substantial and practical use upon discharge  in facilitating mobility and self-care at the household level. 5. Physiatrist will provide 24 hour management of medical needs as well as oversight of the therapy plan/treatment and provide guidance as appropriate regarding the interaction of the two. 6. 24 hour rehab nursing will assist with bladder management, bowel management, safety, skin/wound care, disease management, medication administration, pain management and patient education  and help integrate therapy concepts, techniques,education, etc. 7. PT will assess and treat for:  fxnl mobility, safety, NMR, cognitive awareness, family ed, adaptive equipment and techniques .  Goals are: supervision. 8. OT will assess and treat for: UES, ADL's, safety, NMR, cognitive awareness, family ed, adaptive equipment.   Goals are: supervision to minimal asisstance. 9. SLP will assess and treat for: cognition and communication.  Goals are: supervision to minimal assistance. 10. Case Management and Social Worker will assess and treat for psychological issues and discharge planning. 11. Team conference will be held weekly to assess progress toward goals and to determine barriers to discharge. 12. Patient  will receive at least 3 hours of therapy per day at  least 5 days per week. 13. ELOS: 10-14 days      Prognosis:  good   Medical Problem List and Plan: 1. DVT Prophylaxis/Anticoagulation: Mechanical: Sequential compression devices, below knee Bilateral lower extremities 2. Pain Management: denies any pain at current time. Wife reports arthritis "all over" 3. Mood: Monitor for now. Effexor resumed. LCSW to follow up for formal evaluation.  4. Neuropsych: This patient is not capable of making decisions on his/her own behalf. 5. CAD: Low dose Ace resumed.  6. Atrial Fibrillation: Off blood thinner due to ICH. Monitor HR with bid checks. 7. Chronic anemia: Was getting iron transfusion PTA. Baseline Hgb ~9.3 per wife.  Will recheck post transfusion which is taking place today. 8. OSA:  Continue CPAP with one liter O2  whenever sleeping. 9.  H/O Gastrectomy with gastroparesis: Small meals.  Will add snacks to help with intake.  10. Lethargy: check sleep wake cycle. May need low dose stimulant to help with activation.  11. BPH: continue Proscar daily.    03/14/2012, Ivory Broad, MD

## 2012-03-14 NOTE — Plan of Care (Addendum)
Overall Plan of Care [facility]) Patient Details Name: Eric [name] MRN: [id number] DOB: 10-28-1925  Diagnosis: TBI  Primary Diagnosis:    ICH (intracerebral hemorrhage) Co-morbidities: osa, htn, afib  Functional Problem List  Patient demonstrates impairments in the following areas: Balance, Bladder, Bowel, Cognition, Edema, Endurance, Medication Management, Motor, Nutrition, Pain, Safety and Skin Integrity  Basic ADL's: eating, grooming, bathing, dressing and toileting Advanced ADL's: n/a  Transfers:  bed mobility, bed to chair, toilet, tub/shower, car and furniture Locomotion:  ambulation and stairs  Additional Impairments:  Functional use of upper extremity, Communication  comprehension and expression and Social Cognition   social interaction, problem solving, memory, attention and awareness  Anticipated Outcomes Item Anticipated Outcome  Eating/Swallowing  independent  Basic self-care  supervision  Tolieting  supervision  Bowel/Bladder  Continent with toileting  Min asist  Transfers  supervision  Locomotion  supervision  Communication  Min A  Cognition  Min A  Pain   3 or less on scale of 1-10  Safety/Judgment  supervision  Other     Therapy Plan: PT Frequency: 1-2 X/day, 60-90 minutes OT Frequency: 1-2 X/day, 60-90 minutes SLP Frequency: 1-2 X/day, 30-60 minutes   Team Interventions: Item RN PT OT SLP SW TR Other  Self Care/Advanced ADL Retraining   x      Neuromuscular Re-Education  x x      Therapeutic Activities  x x x  x   UE/LE Strength Training/ROM  x x   x   UE/LE Coordination Activities  x x   x   Visual/Perceptual Remediation/Compensation   x      DME/Adaptive Equipment Instruction  x x   x   Therapeutic Exercise  x x   x   Balance/Vestibular Training  x x   x   Patient/Family Education x x x x  x   Cognitive Remediation/Compensation  x x x  x   Functional Mobility Training  x x   x   Ambulation/Gait Training  x       Stair Training  x        Wheelchair Propulsion/Positioning  x       Functional Tourist information centre manager Reintegration  x    x   Dysphagia/Aspiration Precaution Training    x     Speech/Language Facilitation    x     Bladder Management x        Bowel Management x        Disease Management/Prevention x        Pain Management x x       Medication Management x        Skin Care/Wound Management x        Splinting/Orthotics  x x      Discharge Planning x x x x  x   Psychosocial Support x  x x  x                      Team Discharge Planning: Destination:  Home Projected Follow-up:  PT, OT, SLP and Outpatient Projected Equipment Needs:  Bedside Commode, Tub Bench and Walker Patient/family involved in discharge planning:  Yes  MD ELOS: 3 weeks Medical Rehab Prognosis:  Good Assessment: Pt is admitted for CIR therapies. The team will be addressing self-care, fxnl mobility, cognition and communication, behavior, wound care, pain mgt, family education. Goals are essentially set at supervision for self-care and mobility and  min assist for cognition. He has a supportive psychosocial network.

## 2012-03-14 NOTE — Progress Notes (Addendum)
1525 Pt. Admitted to rehab 4002 from 3300.  Report received from [place], [place].  Pt. Drowsy (from [place] per RN report) and receiving blood iv.  VSS; continuing to monitor.  Family at bedside and oriented to rehab.  Call bell in reach.

## 2012-03-14 NOTE — Progress Notes (Signed)
Noted pt for transfusion today. Met with wife and niece at bedside. I contacted Dr. Molli Knock to clarify when pt felt to be medically ready to d/c to inpt rehab/CIR. He will assess on rounds today and let me know asap. Please call for any questions. [phone]

## 2012-03-14 NOTE — Discharge Summary (Signed)
Physician Discharge Summary  Patient ID: Eric [name] MRN: [id number] DOB/AGE: November 29, 1925 78 y.o.  Admit date: 03/08/2012 Discharge date: 03/14/2012    Discharge Diagnoses:  ICH (intracerebral hemorrhage) Hematoma of frontal scalp  Mobility deficits s/p ICH Dysphagia in setting of ICH Hypertension Atrial Fibrillation Long term use of anticoagulant Obstructive sleep apnea Right pleural effusion     Brief Summary: Eric [name] is a 78 y.o. y/o male with a PMH of A Fib on xarelto who transferred from [facility] 03/08/2012 after a ground level fall striking his head on the curb with traumatic Rt frontal ICH.  He was evaluated by Dr. Wynetta Emery who recommended neuro observation and serial CT scans. Anticoagulation was held in setting of ICH.  He was observed closely in ICU with repeat CT imaging that demonstrated stable right frontal hemorrhagic contusions. Right shoulder CT with suspicion of large retracted RTC tear with advanced degenerative changes as well as incidental note of large R-pleural effusion and small pulmonary nodules.  Recurrent effusion likely in setting of CHF.  He was evaluated by Speech Therapy and ultimately cleared for regular diet with thin liquids.  Physical therapy consulted and patient did have mobility deficits.  CIR was consulted for admit for further rehab efforts.     Consults: Neurosurgery - Dr. Wynetta Emery Physical Therapy Speech Therapy   Microbiology/Sepsis markers: 10/2 MRSA PCR>>>neg                                                                      Hospital Summary by Discharge Diagnosis  ICH (intracerebral hemorrhage) Hematoma of frontal scalp  Mobility deficits s/p ICH Dysphagia in setting of ICH Right Rotator Cuff Tear Eric [name] transferred from [facility] 03/08/2012 after a ground level fall striking his head on the curb with traumatic Rt frontal ICH.  He was evaluated by Dr. Wynetta Emery who recommended neuro observation and serial CT scans. Anticoagulation (afib)  was held in setting of ICH.  He was observed closely in ICU with repeat CT imaging that demonstrated stable right frontal hemorrhagic contusions. Right shoulder CT with suspicion of large retracted RTC tear with advanced degenerative changes.  He was evaluated by speech therapy & physical therapy.  Ultimately was cleared for regular diet and recommended for CIR.    Discharge Plan: -HOLD all anti-coagulation -likely can resume ASA in 1-2 weeks -further PT/OT efforts per CIR -R frontal sutures can be removed around 10/15, wound care / keep area moist -regular diet, monitor for evidence of aspiration -consider further follow up with Ortho for Rotator cuff tear    Hypertension Atrial Fibrillation Long term use of anticoagulant Hyperlipidemia Chronic Diastolic Dysfunction History of HTN, AFib on Xarelto & ASA.  Followed by Dr. Daleen Squibb as outpatient.  Anti-coagulation held in setting of traumatic ICH.    Discharge Plan: -Resume ACE-I with enalapril per Cardiology -HOLD xarelto -consider resume ASA as above -Follow up with Dr. Daleen Squibb post CIR discharge -continue simvastatin   Obstructive sleep apnea Right pleural effusion CT with incidental note of large R-pleural effusion and small pulmonary nodules.  Effusion likely in setting of heart failure.  Known history of OSA followed by Dr. Shelle Iron.    Discharge Plan: -CPAP QHS and PRN sleep -follow up with Dr. Shelle Iron post d/c  from Rehab  Anemia In setting of critical illness / ICH.  Transfused 2 units PRBC's per Dr. Daleen Squibb 10/8.    Discharge Plan: -follow up CBC post transfusion and PRN     Discharge Exam: General - no distress  HEENT - Peri-orbital ecchymosis on Rt with surrounding edema, bruising over Rt face, sutures intact Cardiac - irregular, no murmur, pulses symmetric  Chest - decreased breath sounds Rt bases, no wheeze  Abd - soft, decreased bowel sounds, non-tender, no masses  Ext - no edema  Skin - ecchymosis to R arm / shoulder,  hip / thigh Neuro - awake, follows commands, moves all extremities, non focal, delayed responses   Filed Vitals:   03/14/12 0500 03/14/12 0747 03/14/12 1220 03/14/12 1235  BP:  157/80 134/66 136/60  Pulse:  64 60 62  Temp:  97.7 F (36.5 C) 98.7 F (37.1 C) 97.7 F (36.5 C)  TempSrc:  Oral Oral Oral  Resp:  25 24 22   Height:      Weight: 167 lb 8.8 oz (76 kg)     SpO2:  96%       Discharge Labs  BMET  Lab 03/14/12 0501 03/13/12 0505 03/12/12 0445 03/11/12 0420 03/10/12 0650  NA 145 141 141 141 140  K 3.7 3.4* -- -- --  CL 111 108 106 105 104  CO2 25 25 26 28 27   GLUCOSE 107* 106* 115* 112* 119*  BUN 26* 27* 22 21 20   CREATININE 0.84 0.92 0.94 1.02 0.99  CALCIUM 9.2 8.9 8.9 9.0 9.2  MG 2.2 -- -- -- --  PHOS 3.4 -- -- -- --   CBC  Lab 03/14/12 0501 03/13/12 0505 03/12/12 0445  HGB 7.7* 8.0* 7.3*  HCT 25.4* 26.1* 24.0*  WBC 7.0 7.0 8.0  PLT 169 175 175   Anti-Coagulation  Lab 03/11/12 0420 03/10/12 0650 03/09/12 0430 03/08/12 2117 03/08/12 1730  INR 1.29 1.25 1.38 1.28 1.90*    Follow Up Appointments     Discharge Orders    Future Appointments: Provider: Department: Dept Phone: Center:   05/08/2012 8:35 AM [facility] [phone] [facility]   05/18/2012 9:00 AM Gaylord Shih, MD [facility] [phone] [facility]      CURRENT DISCHARGE MEDICATIONS   Medication List     As of 03/14/2012  1:17 PM    CONTINUE taking these medications         docusate sodium 100 MG capsule   Commonly known as: COLACE      finasteride 5 MG tablet   Commonly known as: PROSCAR      furosemide 20 MG tablet   Commonly known as: LASIX   Take 1 tablet (20 mg total) by mouth daily.      MULTIVITAMINS PO      pantoprazole 40 MG tablet   Commonly known as: PROTONIX      potassium chloride SA 20 MEQ tablet   Commonly known as: K-DUR,KLOR-CON      simvastatin 20 MG tablet   Commonly known as: ZOCOR   Take 1  tablet (20 mg total) by mouth every evening.      Tamsulosin HCl 0.4 MG Caps   Commonly known as: FLOMAX   Take 1 capsule (0.4 mg total) by mouth daily after supper.      venlafaxine XR 37.5 MG 24 hr capsule   Commonly known as: EFFEXOR-XR      STOP taking these medications  aspirin 81 MG tablet      Rivaroxaban 20 MG Tabs   Commonly known as: Carlena Hurl         Disposition: [facility]   Discharged Condition: Eric [name] has met maximum benefit of inpatient care and is medically stable and cleared for discharge.  Patient is pending follow up as above.      Time spent on disposition:  Greater than 35 minutes.   Signed: Canary Brim, NP-C [facility] Pgr: [phone]  Patient ready for inpatient rehab.  Sutures on the head will need to be removed in 6-7 days assuming wound continues to heal at current rate.  Patient seen and examined, agree with above note.  Alyson Reedy, M.D. [facility]. Division of Pulmonary and Critical Care Medicine. Pager [phone].

## 2012-03-14 NOTE — Progress Notes (Signed)
Pt d/c to inpatient rehab. Per MD order. Report called to receiving nurse and all questions answered.

## 2012-03-14 NOTE — Interval H&P Note (Signed)
Eric [name] was admitted today to Inpatient Rehabilitation with the diagnosis of Right frontal lobe contusions.  The patient's history has been reviewed, patient examined, and there is no change in status.  Patient continues to be appropriate for intensive inpatient rehabilitation.  I have reviewed the patient's chart and labs.  Questions were answered to the patient's satisfaction.  Lakashia Collison T 03/14/2012, 8:26 PM

## 2012-03-14 NOTE — H&P (View-Only) (Signed)
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 male With history of A Fib on xarelto who sustained a ground level fall and struck his head against the curb on 03/08/12. Taken to [facility] where xrays done revealed R-frontal ICH. He was transferred to [facility] and evaluated by Dr. Cram who recommended monitoring MS and serial CCT. MS has been stable. Last CT with stable right frontal hemorrhagic contusions. Right shoulder CT with suspicion of large retracted RTC tear with advanced degenerative changes as well as incidental note of large R-pleural effusion and small pulmonary nodules. Pulmonary feels patient with recurrent effusion likely due to heart failure. He has had mild confusion and poor po intake reported. ST evaluation revealed mild oral transit delay initially. No signs or symptoms of aspiration and patient advanced to regular diet. Acute on chronic anemia to be treated with 2 units PRBC today. Ace resumed by cardiology.  PT evaluation done and patient with deficits in mobility. CIR consulted for progression  Review of Systems  HENT: Negative for hearing loss.   Eyes: Negative for blurred vision and double vision.  Respiratory: Positive for shortness of breath.   Cardiovascular: Negative for chest pain and palpitations.  Gastrointestinal: Negative for heartburn, vomiting and abdominal pain.  Musculoskeletal: Positive for back pain (scoliosis with flexed posture and tended to shuffle. ) and joint pain (bilateral RTC tears. ).  Neurological: Negative for headaches.  Psychiatric/Behavioral: Positive for depression (in the past due to medical issues) and memory loss.   Past Medical History  Diagnosis Date  . BPH (benign prostatic hypertrophy)   . DJD (degenerative joint disease)   . Hx of transient ischemic attack (TIA)   . AAA (abdominal aortic aneurysm)   . Cerebrovascular disease   . HLD (hyperlipidemia)    . HTN (hypertension)   . Second degree AV block, Mobitz type II     s/p PPM  . CAD (coronary artery disease)   . Persistent atrial fibrillation     on pradaxa  . Sleep apnea     cpap machine  . Gastric ulcer, acute 02/2011    multiple - NSAID/Pradaxa thought causative. associated gastroparesis-like problems  . Small bowel obstruction     multiple over the years  . Anemia   . Anemia    Past Surgical History  Procedure Date  . Pacemaker insertion 01/24/10    Adapta Medtronic   . Coronary artery bypass graft   . Appendectomy   . Gastrectomy   . Knee arthroscopy     right, with medial and lateral meniscal  . Finger surgery      Left middle finger dorsal lesion  . Upper gastrointestinal endoscopy 03/12/2011    stomach ulcer, esophageal erosion  . Esophagogastroduodenoscopy 01/13/2012    Procedure: ESOPHAGOGASTRODUODENOSCOPY (EGD);  Surgeon: Carl E Gessner, MD;  Location: [facility] ENDOSCOPY;  Service: Endoscopy;  Laterality: N/A;  . Flexible sigmoidoscopy 01/13/2012    Procedure: FLEXIBLE SIGMOIDOSCOPY;  Surgeon: Carl E Gessner, MD;  Location: [facility] ENDOSCOPY;  Service: Endoscopy;  Laterality: N/A;  Fleets enema on arrival   Family History  Problem Relation Age of Onset  . Coronary artery disease Father   . Colon cancer Brother    Social History: Married. Independent and active PTA-goes to the gym 4 days/wk and runs a hosiery mill. Niece/nephew help out. Wife reports that he quit smoking about 43 years ago. His smoking use included Cigarettes.   He has a 5 pack-year smoking history. He has never used smokeless tobacco. He drinks a glass of wine occasionally. He does not use illicit drugs. Wife supportive  Allergies  Allergen Reactions  . Morphine Other (See Comments)    Reaction unknown  . Penicillins Other (See Comments)    Reaction unknown  . Promethazine Hcl Other (See Comments)    Change in behavior    Scheduled Meds:   . diphenhydrAMINE  25 mg Oral Once  . docusate sodium  100 mg  Oral BID  . enalapril  2.5 mg Oral BID  . finasteride  5 mg Oral Daily  . furosemide  20 mg Intravenous Once  . furosemide  20 mg Oral Daily  . magnesium hydroxide  15 mL Oral Daily  . pantoprazole  40 mg Oral Q1200  . potassium chloride  40 mEq Oral Once  . senna-docusate  1 tablet Oral BID  . simvastatin  20 mg Oral QPM  . sodium phosphate  1 enema Rectal Once  . venlafaxine XR  37.5 mg Oral Q breakfast  . DISCONTD: docusate sodium  100 mg Oral Daily   Continuous Infusions:  PRN Meds:.acetaminophen, neomycin-bacitracin-polymyxin  Medications Prior to Admission  Medication Sig Dispense Refill  . aspirin 81 MG tablet Take 1 tablet (81 mg total) by mouth daily.      . docusate sodium (COLACE) 100 MG capsule Take 100 mg by mouth daily.        . finasteride (PROSCAR) 5 MG tablet Take 5 mg by mouth daily.      . furosemide (LASIX) 20 MG tablet Take 1 tablet (20 mg total) by mouth daily.  30 tablet  7  . Multiple Vitamin (MULTIVITAMINS PO) Take 1 tablet by mouth daily.        . pantoprazole (PROTONIX) 40 MG tablet Take 40 mg by mouth daily.      . potassium chloride SA (K-DUR,KLOR-CON) 20 MEQ tablet Take 20 mEq by mouth daily.      . Rivaroxaban (XARELTO) 20 MG TABS Take 1 tablet (20 mg total) by mouth daily. CONTINUE TO HOLD THIS MEDICATION UNTIL YOU HEAR FROM DR. WALL  30 tablet  6  . simvastatin (ZOCOR) 20 MG tablet Take 1 tablet (20 mg total) by mouth every evening.  30 tablet  11  . Tamsulosin HCl (FLOMAX) 0.4 MG CAPS Take 1 capsule (0.4 mg total) by mouth daily after supper.  30 capsule  2  . venlafaxine XR (EFFEXOR-XR) 37.5 MG 24 hr capsule Take 37.5 mg by mouth daily. Increase to 75mg after 30 days        Home: Home Living Lives With: Spouse Available Help at Discharge: Family Type of Home: House Home Access: Stairs to enter Entrance Stairs-Number of Steps: 1 Entrance Stairs-Rails: Right;Left Home Layout: One level Bathroom Shower/Tub: Other (comment) (walk-in  tub) Bathroom Toilet: Handicapped height Bathroom Accessibility: Yes How Accessible: Accessible via walker Home Adaptive Equipment: Bedside commode/3-in-1;Grab bars around toilet;Grab bars in shower;Walker - rolling;Straight cane   Functional History: Prior Function Able to Take Stairs?: Yes Driving: No Vocation:  (owns [employer]. Goes daily 2 to 3 hrs am and afternoo)  Functional Status:  Mobility: Bed Mobility Bed Mobility: Supine to Sit Supine to Sit: 1: +2 Total assist;HOB elevated (HOB 30 degrees.) Supine to Sit: Patient Percentage: 50% Sitting - Scoot to Edge of Bed: 3: Mod assist Transfers Transfers: Sit to Stand;Stand to Sit (2 trials.) Sit to Stand: 1: +2 Total assist;With upper extremity   assist;From bed;From chair/3-in-1 Sit to Stand: Patient Percentage: 60% Stand to Sit: 1: +2 Total assist;With upper extremity assist;To chair/3-in-1 Stand to Sit: Patient Percentage: 60% Ambulation/Gait Ambulation/Gait Assistance: 1: +2 Total assist Ambulation/Gait: Patient Percentage: 60% Ambulation Distance (Feet): 100 Feet (60 feet and 40 feet.) Assistive device: Rolling walker Ambulation/Gait Assistance Details: Assist for balance to keep weight shifted anterior over BOS. Assist to also advance/direct RW. Cues for tall posture and safety with RW. Gait Pattern: Step-through pattern;Decreased step length - right;Decreased step length - left;Decreased stride length;Shuffle;Trunk flexed Gait velocity: slow cadence Stairs: No Wheelchair Mobility Wheelchair Mobility: No  ADL: ADL Grooming: Simulated;Moderate assistance Where Assessed - Grooming: Supported sitting Upper Body Dressing: Simulated;Moderate assistance Where Assessed - Upper Body Dressing: Supported sitting Lower Body Dressing: Performed;Maximal assistance Where Assessed - Lower Body Dressing: Supported sit to stand Toilet Transfer: Simulated;Moderate assistance Toilet Transfer Method: Sit to stand Toilet  Transfer Equipment:  (to/from chair) Equipment Used: Gait belt;Rolling walker Transfers/Ambulation Related to ADLs: Pt +2total A(pt=60%) with RW ambulation into hall and back. Pt fatigues easily. 02 remained stable in the 90's (on 2L 02). ADL Comments: Slow processing/difficulty initiating (per PT worse than eval)  Cognition: Cognition Arousal/Alertness: Awake/alert Orientation Level: Oriented to person Cognition Overall Cognitive Status: Impaired Area of Impairment: Attention;Following commands;Problem solving;Executive functioning Arousal/Alertness: Awake/alert Orientation Level: Appears intact for tasks assessed Behavior During Session: WFL for tasks performed Current Attention Level: Sustained Following Commands: Follows one step commands with increased time Problem Solving: Difficulty problem solving. Executive Functioning: Decreased initiation throughout session.   Blood pressure 157/80, pulse 64, temperature 97.7 F (36.5 C), temperature source Oral, resp. rate 25, height 5' 11" (1.803 m), weight 76 kg (167 lb 8.8 oz), SpO2 96.00%. Physical Exam  Nursing note and vitals reviewed. Constitutional: He appears well-developed and well-nourished.  HENT:  Head: Normocephalic.       Diffuse ecchymosis on frontal scalp extending to right forehead and right cheek. Minimal left periorbital ecchymosis. Right eyebrow with resolving hematoma with scabs and three sutures.   Eyes: Pupils are equal, round, and reactive to light.       Right scleral hemorrhage  Neck: Normal range of motion.  Cardiovascular: Normal rate and regular rhythm.   Murmur heard. Pulmonary/Chest: Effort normal and breath sounds normal.  Abdominal: Soft. Bowel sounds are normal.  Musculoskeletal: He exhibits no edema.       RUE with diffuse ecchymosis at shoulder and biceps area. Right hip/thigh with evidence of hematoma and tenderness mid-thigh. Diffuse ecchymosis right flank to right thigh area.   Neurological: He  is alert.       [name] Awake and alert. Poor attention.. Able to state DOB but unable to state situation, age, or wife's name even with cueing.  Quiet voice with flat affect and delayed initiation and slow movements. Pain inhibition affecting movement of RUE/RLE. Had perhaps slight difficulty with visual acuity through th right eye.   Skin: Skin is warm and dry.       Bruising and pain noted over the right hand. He has decreased grip as a result.    CBC     Status: Abnormal   Collection Time   03/14/12  5:01 AM      Component Value Range Comment   WBC 7.0  4.0 - 10.5 K/uL    RBC 3.10 (*) 4.22 - 5.81 MIL/uL    Hemoglobin 7.7 (*) 13.0 - 17.0 g/dL    HCT 25.4 (*) 39.0 - 52.0 %      MCV 81.9  78.0 - 100.0 fL    MCH 24.8 (*) 26.0 - 34.0 pg    MCHC 30.3  30.0 - 36.0 g/dL    RDW 20.0 (*) 11.5 - 15.5 %    Platelets 169  150 - 400 K/uL   BASIC METABOLIC PANEL     Status: Abnormal   Collection Time   03/14/12  5:01 AM      Component Value Range Comment   Sodium 145  135 - 145 mEq/L    Potassium 3.7  3.5 - 5.1 mEq/L    Chloride 111  96 - 112 mEq/L    CO2 25  19 - 32 mEq/L    Glucose, Bld 107 (*) 70 - 99 mg/dL    BUN 26 (*) 6 - 23 mg/dL    Creatinine, Ser 0.84  0.50 - 1.35 mg/dL    Calcium 9.2  8.4 - 10.5 mg/dL    GFR calc non Af Amer 78 (*) >90 mL/min    GFR calc Af Amer >90  >90 mL/min   MAGNESIUM     Status: Normal   Collection Time   03/14/12  5:01 AM      Component Value Range Comment   Magnesium 2.2  1.5 - 2.5 mg/dL   PHOSPHORUS     Status: Normal   Collection Time   03/14/12  5:01 AM      Component Value Range Comment   Phosphorus 3.4  2.3 - 4.6 mg/dL   PREPARE RBC (CROSSMATCH)     Status: Normal   Collection Time   03/14/12  8:00 AM      Component Value Range Comment   Order Confirmation ORDER PROCESSED BY BLOOD BANK      No results found.  Post Admission Physician Evaluation: 1. Functional deficits secondary  to traumatic brain injury. 2. Patient is  admitted to receive collaborative, interdisciplinary care between the physiatrist, rehab nursing staff, and therapy team. 3. Patient's level of medical complexity and substantial therapy needs in context of that medical necessity cannot be provided at a lesser intensity of care such as a SNF. 4. Patient has experienced substantial functional loss from his/her baseline which was documented above under the "Functional History" and "Functional Status" headings.  Judging by the patient's diagnosis, physical exam, and functional history, the patient has potential for functional progress which will result in measurable gains while on inpatient rehab.  These gains will be of substantial and practical use upon discharge  in facilitating mobility and self-care at the household level. 5. Physiatrist will provide 24 hour management of medical needs as well as oversight of the therapy plan/treatment and provide guidance as appropriate regarding the interaction of the two. 6. 24 hour rehab nursing will assist with bladder management, bowel management, safety, skin/wound care, disease management, medication administration, pain management and patient education  and help integrate therapy concepts, techniques,education, etc. 7. PT will assess and treat for:  fxnl mobility, safety, NMR, cognitive awareness, family ed, adaptive equipment and techniques .  Goals are: supervision. 8. OT will assess and treat for: UES, ADL's, safety, NMR, cognitive awareness, family ed, adaptive equipment.   Goals are: supervision to minimal asisstance. 9. SLP will assess and treat for: cognition and communication.  Goals are: supervision to minimal assistance. 10. Case Management and Social Worker will assess and treat for psychological issues and discharge planning. 11. Team conference will be held weekly to assess progress toward goals and to determine barriers to discharge. 12. Patient   will receive at least 3 hours of therapy per day at  least 5 days per week. 13. ELOS: 10-14 days      Prognosis:  good   Medical Problem List and Plan: 1. DVT Prophylaxis/Anticoagulation: Mechanical: Sequential compression devices, below knee Bilateral lower extremities 2. Pain Management: denies any pain at current time. Wife reports arthritis "all over" 3. Mood: Monitor for now. Effexor resumed. LCSW to follow up for formal evaluation.  4. Neuropsych: This patient is not capable of making decisions on his/her own behalf. 5. CAD: Low dose Ace resumed.  6. Atrial Fibrillation: Off blood thinner due to ICH. Monitor HR with bid checks. 7. Chronic anemia: Was getting iron transfusion PTA. Baseline Hgb ~9.3 per wife.  Will recheck post transfusion which is taking place today. 8. OSA:  Continue CPAP with one liter O2  whenever sleeping. 9.  H/O Gastrectomy with gastroparesis: Small meals.  Will add snacks to help with intake.  10. Lethargy: check sleep wake cycle. May need low dose stimulant to help with activation.  11. BPH: continue Proscar daily.    03/14/2012, Zach Dequavion Follette, MD   

## 2012-03-15 ENCOUNTER — Inpatient Hospital Stay (HOSPITAL_COMMUNITY): Payer: Medicare Other | Admitting: Speech Pathology

## 2012-03-15 ENCOUNTER — Inpatient Hospital Stay (HOSPITAL_COMMUNITY): Payer: Medicare Other | Admitting: Physical Therapy

## 2012-03-15 ENCOUNTER — Inpatient Hospital Stay (HOSPITAL_COMMUNITY): Payer: Medicare Other | Admitting: Occupational Therapy

## 2012-03-15 DIAGNOSIS — S069X9A Unspecified intracranial injury with loss of consciousness of unspecified duration, initial encounter: Secondary | ICD-10-CM

## 2012-03-15 DIAGNOSIS — W19XXXA Unspecified fall, initial encounter: Secondary | ICD-10-CM

## 2012-03-15 DIAGNOSIS — Z5189 Encounter for other specified aftercare: Secondary | ICD-10-CM

## 2012-03-15 LAB — COMPREHENSIVE METABOLIC PANEL
ALT: 20 U/L (ref 0–53)
AST: 21 U/L (ref 0–37)
Alkaline Phosphatase: 89 U/L (ref 39–117)
CO2: 27 mEq/L (ref 19–32)
Chloride: 107 mEq/L (ref 96–112)
GFR calc non Af Amer: 75 mL/min — ABNORMAL LOW (ref >90)
Potassium: 3.6 mEq/L (ref 3.5–5.1)
Sodium: 143 mEq/L (ref 135–145)
Total Bilirubin: 1.3 mg/dL — ABNORMAL HIGH (ref 0.3–1.2)

## 2012-03-15 LAB — TYPE AND SCREEN
ABO/RH(D): A POS
Antibody Screen: NEGATIVE
Unit division: 0

## 2012-03-15 LAB — CBC WITH DIFFERENTIAL/PLATELET
Basophils Absolute: 0 10*3/uL (ref 0.0–0.1)
Lymphocytes Relative: 17 % (ref 12–46)
Lymphs Abs: 1.3 10*3/uL (ref 0.7–4.0)
Neutro Abs: 5.2 10*3/uL (ref 1.7–7.7)
Platelets: 157 10*3/uL (ref 150–400)
RBC: 3.55 MIL/uL — ABNORMAL LOW (ref 4.22–5.81)
RDW: 19 % — ABNORMAL HIGH (ref 11.5–15.5)
WBC: 7.9 10*3/uL (ref 4.0–10.5)

## 2012-03-15 MED ORDER — DICLOFENAC SODIUM 1 % TD GEL
2.0000 g | Freq: Four times a day (QID) | TRANSDERMAL | Status: DC
  Administered 2012-03-15 – 2012-04-02 (×42): 2 g via TOPICAL
  Filled 2012-03-15: qty 100

## 2012-03-15 MED ORDER — POTASSIUM CHLORIDE CRYS ER 20 MEQ PO TBCR
20.0000 meq | EXTENDED_RELEASE_TABLET | Freq: Every day | ORAL | Status: DC
  Administered 2012-03-15 – 2012-03-21 (×7): 20 meq via ORAL
  Filled 2012-03-15 (×9): qty 1

## 2012-03-15 NOTE — Care Management Note (Signed)
    Page 1 of 1   03/15/2012     10:22:10 AM   CARE MANAGEMENT NOTE 03/15/2012  Patient:  Eric [name], Eric [name]   Account Number:  [id number]  Date Initiated:  03/13/2012  Documentation initiated by:  Donn Pierini  Subjective/Objective Assessment:   Pt admitted s/p fall with ICH     Action/Plan:   PTA pt lived at home with wife, independent- PT/OT evals   Anticipated DC Date:  03/14/2012   Anticipated DC Plan:  IP REHAB FACILITY  In-house referral  Clinical Social Worker      DC Associate Professor  CM consult      Choice offered to / List presented to:             Status of service:  Completed, signed off Medicare Important Message given?   (If response is "NO", the following Medicare IM given date fields will be blank) Date Medicare IM given:   Date Additional Medicare IM given:    Discharge Disposition:  IP REHAB FACILITY  Per UR Regulation:  Reviewed for med. necessity/level of care/duration of stay  If discussed at Long Length of Stay Meetings, dates discussed:   03/14/2012    Comments:  PCP- WALL, THOMAS C  03/14/12- 1400- Donn Pierini RN, BSN [phone] Pt for d/c to CIR today.  03/13/12- 1230- Donn Pierini RN, BSN [phone] CIR has been consulted for possible admission, if CIR does not work out - pt's wife wants to take pt home with [facility]. NCM to follow for d/c needs/plan

## 2012-03-15 NOTE — Evaluation (Signed)
Speech Language Pathology Assessment and Plan  Patient Details  Name: Eric [name] MRN: [id number] Date of Birth: 1926/02/25  SLP Diagnosis: Cognitive Impairments;Dysphagia  Rehab Potential: Good ELOS: 3 weeks   Today's Date: 03/15/2012 Time: 1015-1100 Time Calculation (min): 45 min  Skilled Therapeutic Intervention: Administered cognitive-linguistic evaluation. Please see below for details.   Problem List:  Patient Active Problem List  Diagnosis  . HYPERLIPIDEMIA  . OBSTRUCTIVE SLEEP APNEA  . HYPERTENSION  . CAD, AUTOLOGOUS BYPASS GRAFT  . MOBITZ II ATRIOVENTRICULAR BLOCK  . RBBB W/ LAFB  . Atrial fibrillation  . CEREBROVASCULAR DISEASE  . ABDOMINAL AORTIC ANEURYSM  . DEGENERATIVE JOINT DISEASE  . TRANSIENT ISCHEMIC ATTACKS, HX OF  . BENIGN PROSTATIC HYPERTROPHY, HX OF  . PACEMAKER, PERMANENT  . Long term (current) use of anticoagulants  . Pulmonary nodule  . Gastric stasis with food retention  . Recurrent small bowel obstructions  . Chronic diastolic heart failure  . OSA (obstructive sleep apnea)  . Iron deficiency anemia, unspecified  . Gastritis  . Internal hemorrhoids  . ICH (intracerebral hemorrhage)  . Hematoma of frontal scalp  . Pleural effusion on right   Past Medical History:  Past Medical History  Diagnosis Date  . BPH (benign prostatic hypertrophy)   . DJD (degenerative joint disease)   . Hx of transient ischemic attack (TIA)   . AAA (abdominal aortic aneurysm)   . Cerebrovascular disease   . HLD (hyperlipidemia)   . HTN (hypertension)   . Second degree AV block, Mobitz type II     s/p PPM  . CAD (coronary artery disease)   . Persistent atrial fibrillation     on pradaxa  . Sleep apnea     cpap machine  . Gastric ulcer, acute 02/2011    multiple - NSAID/Pradaxa thought causative. associated gastroparesis-like problems  . Small bowel obstruction     multiple over the years  . Anemia   . Anemia    Past Surgical History:  Past Surgical  History  Procedure Date  . Pacemaker insertion 01/24/10    Adapta Medtr[employer]   . Coronary artery bypass graft   . Appendectomy   . Gastrectomy   . Knee arthroscopy     right, with medial and lateral meniscal  . Finger surgery      Left middle finger dorsal lesion  . Upper gastrointestinal endoscopy 03/12/2011    stomach ulcer, esophageal erosion  . Esophagogastroduodenoscopy 01/13/2012    Procedure: ESOPHAGOGASTRODUODENOSCOPY (EGD);  Surgeon: Iva Boop, MD;  Location: [facility];  Service: Endoscopy;  Laterality: N/A;  . Flexible sigmoidoscopy 01/13/2012    Procedure: FLEXIBLE SIGMOIDOSCOPY;  Surgeon: Iva Boop, MD;  Location: [facility];  Service: Endoscopy;  Laterality: N/A;  Fleets enema on arrival    Assessment / Plan / Recommendation Clinical Impression  Patient is a 78 y.o. year old male With history of A Fib on xarelto who sustained a ground level fall and struck his head against the curb on 03/08/12. Taken to [facility] where xrays done revealed R-frontal ICH. He was transferred to [facility] hospital and evaluated by Dr. Wynetta Emery who recommended monitoring MS and serial CCT. MS has been stable. Last CT with stable right frontal hemorrhagic contusions. Right shoulder CT with suspicion of large retracted RTC tear with advanced degenerative changes as well as incidental note of large R-pleural effusion and small pulmonary nodules. Pulmonary feels patient with recurrent effusion likely due to heart failure. He has had mild confusion and poor po  intake reported. Currently, pt demonstrates behaviors consistent with a Rancho Level V and requires Max-Total A for initiation, perseveration, sustained attention, intellectual awareness, orientation, problem solving, safety awareness and delayed processing. Pt's overall cognitive function also impacts auditory comprehension of basic information and verbal expression of wants/needs. Patient will benefit from skilled SLP intervention to maximize cognitive  function and increase independence with prior to discharge home with care partner. Anticipate patient will require 24 hour supervision and follow up outpatient therapy.    SLP Assessment  Patient will need skilled Speech Lanaguage Pathology Services during CIR admission    Recommendations  Follow up Recommendations: 24 hour supervision/assistance;Outpatient SLP Equipment Recommended: None recommended by SLP    SLP Frequency 1-2 X/day, 30-60 minutes   SLP Treatment/Interventions Cognitive remediation/compensation;Cueing hierarchy;Functional tasks;Patient/family education;Therapeutic Activities;Speech/Language facilitation;Internal/external aids;Dysphagia/aspiration precaution training;Environmental controls    Pain Pain Assessment Pain Assessment: No/denies pain Prior Functioning Type of Home: House Lives With: Spouse Available Help at Discharge: Family Vocation: Part time employment  Short Term Goals: Week 1: SLP Short Term Goal 1 (Week 1): Pt will orient to place, time and situation with Max A multimodal cueing.  SLP Short Term Goal 2 (Week 1): Pt will sustain attention to a functional task for ~2 minutes with Max A multimodal cueing. SLP Short Term Goal 3 (Week 1): Pt will utilize call bell to express wants/needs with Max A mutlimodal cueing.  SLP Short Term Goal 4 (Week 1): Pt will identify 1 physical and 1 cognitive deficit with Max A multimodal cueing  See FIM for current functional status Refer to Care Plan for Long Term Goals  Recommendations for other services: None  Discharge Criteria: Patient will be discharged from SLP if patient refuses treatment 3 consecutive times without medical reason, if treatment goals not met, if there is a change in medical status, if patient makes no progress towards goals or if patient is discharged from hospital.  The above assessment, treatment plan, treatment alternatives and goals were discussed and mutually agreed upon: by patient and by  family  Eric [name] 03/15/2012, 3:48 PM

## 2012-03-15 NOTE — Evaluation (Signed)
Physical Therapy Assessment and Plan  Patient Details  Name: Eric [name] MRN: [id number] Date of Birth: [birth year]  PT Diagnosis: Abnormality of gait, Cognitive deficits, Difficulty walking and Muscle weakness Rehab Potential: Good ELOS: 2 weeks   Today's Date: 03/15/2012 Time: 0900-1000 Time Calculation (min): 60 min  Problem List:  Patient Active Problem List  Diagnosis  . HYPERLIPIDEMIA  . OBSTRUCTIVE SLEEP APNEA  . HYPERTENSION  . CAD, AUTOLOGOUS BYPASS GRAFT  . MOBITZ II ATRIOVENTRICULAR BLOCK  . RBBB W/ LAFB  . Atrial fibrillation  . CEREBROVASCULAR DISEASE  . ABDOMINAL AORTIC ANEURYSM  . DEGENERATIVE JOINT DISEASE  . TRANSIENT ISCHEMIC ATTACKS, HX OF  . BENIGN PROSTATIC HYPERTROPHY, HX OF  . PACEMAKER, PERMANENT  . Long term (current) use of anticoagulants  . Pulmonary nodule  . Gastric stasis with food retention  . Recurrent small bowel obstructions  . Chronic diastolic heart failure  . OSA (obstructive sleep apnea)  . Iron deficiency anemia, unspecified  . Gastritis  . Internal hemorrhoids  . ICH (intracerebral hemorrhage)  . Hematoma of frontal scalp  . Pleural effusion on right    Past Medical History:  Past Medical History  Diagnosis Date  . BPH (benign prostatic hypertrophy)   . DJD (degenerative joint disease)   . Hx of transient ischemic attack (TIA)   . AAA (abdominal aortic aneurysm)   . Cerebrovascular disease   . HLD (hyperlipidemia)   . HTN (hypertension)   . Second degree AV block, Mobitz type II     s/p PPM  . CAD (coronary artery disease)   . Persistent atrial fibrillation     on pradaxa  . Sleep apnea     cpap machine  . Gastric ulcer, acute 02/2011    multiple - NSAID/Pradaxa thought causative. associated gastroparesis-like problems  . Small bowel obstruction     multiple over the years  . Anemia   . Anemia    Past Surgical History:  Past Surgical History  Procedure Date  . Pacemaker insertion 01/24/10    Adapta  [employer]tr[employer]   . Coronary artery bypass graft   . Appendectomy   . Gastrectomy   . Knee arthroscopy     right, with medial and lateral meniscal  . Finger surgery      Left middle finger dorsal lesion  . Upper gastrointestinal endoscopy 03/12/2011    stomach ulcer, esophageal erosion  . Esophagogastroduodenoscopy 01/13/2012    Procedure: ESOPHAGOGASTRODUODENOSCOPY (EGD);  Surgeon: Iva Boop, MD;  Location: [facility];  Service: Endoscopy;  Laterality: N/A;  . Flexible sigmoidoscopy 01/13/2012    Procedure: FLEXIBLE SIGMOIDOSCOPY;  Surgeon: Iva Boop, MD;  Location: [facility];  Service: Endoscopy;  Laterality: N/A;  Fleets enema on arrival    Assessment & Plan Clinical Impression: Patient is a 78 y.o. year old male with recent admission to the hospital on sustained a ground level fall and struck his head against the curb on 03/08/12. Taken to [facility] where xrays done revealed R-frontal ICH. He was transferred to [facility] and evaluated by Dr. Wynetta Emery who recommended monitoring MS and serial CCT. MS has been stable. Last CT with stable right frontal hemorrhagic contusions. Right shoulder CT with suspicion of large retracted RTC tear with advanced degenerative changes as well as incidental note of large R-pleural effusion and small pulmonary nodules. Pulmonary feels patient with recurrent effusion likely due to heart failure.   Patient transferred to CIR on 03/14/2012 .   Patient currently requires max with mobility secondary to  muscle weakness, decreased initiation, decreased attention, decreased awareness, decreased safety awareness, decreased memory and delayed processing and decreased standing balance and decreased balance strategies.  Prior to hospitalization, patient was mod I with mobility and lived with Spouse in a House home.  Home access is 2Stairs to enter.  Patient will benefit from skilled PT intervention to maximize safe functional mobility, minimize fall risk and decrease caregiver burden  for planned discharge home with 24 hour supervision.  Anticipate patient will benefit from follow up OP at discharge.  PT - End of Session Activity Tolerance: Tolerates 30+ min activity with multiple rests Endurance Deficit: Yes PT Assessment Rehab Potential: Good PT Plan PT Frequency: 1-2 X/day, 60-90 minutes Estimated Length of Stay: 2 weeks PT Treatment/Interventions: Ambulation/gait training;Community reintegration;DME/adaptive equipment instruction;Neuromuscular re-education;Therapeutic Exercise;UE/LE Strength taining/ROM;Splinting/orthotics;Pain management;Discharge planning;Balance/vestibular training;Cognitive remediation/compensation;Functional mobility training;Therapeutic Activities;UE/LE Coordination activities;Wheelchair propulsion/positioning;Stair training;Patient/family education PT Recommendation Follow Up Recommendations: Outpatient PT  PT Evaluation Precautions/Restrictions Precautions Precautions: Fall Restrictions Weight Bearing Restrictions: No Pain Pain Assessment Pain Assessment: No/denies pain Home Living/Prior Functioning Home Living Lives With: Spouse Available Help at Discharge: Family Type of Home: House Home Access: Stairs to enter Secretary/administrator of Steps: 2 Entrance Stairs-Rails: Left;Right Home Layout: One level Bathroom Shower/Tub: Other (comment) (walk-in tub) Bathroom Toilet: Handicapped height Bathroom Accessibility: Yes How Accessible: Accessible via walker Home Adaptive Equipment: Walker - rolling;Straight cane Prior Function Level of Independence: Independent with transfers;Independent with gait (had just progressed from RW and SPC) Able to Take Stairs?: Yes Driving: No Vocation: Part time employment  Cognition Overall Cognitive Status: Impaired Arousal/Alertness: Awake/alert Orientation Level: Oriented to person Attention: Selective Focused Attention: Impaired Focused Attention Impairment: Verbal basic;Functional  basic Selective Attention: Impaired Selective Attention Impairment: Verbal basic;Functional basic Memory: Impaired Memory Impairment: Storage deficit;Retrieval deficit Awareness: Impaired Awareness Impairment: Intellectual impairment Safety/Judgment: Impaired Rancho Mirant Scales of Cognitive Functioning: Confused/inappropriate/non-agitated Sensation Sensation Light Touch: Appears Intact Proprioception: Appears Intact Coordination Gross Motor Movements are Fluid and Coordinated: Yes Fine Motor Movements are Fluid and Coordinated: Yes Motor  Motor Motor: Abnormal postural alignment and control Motor - Skilled Clinical Observations: generalized weakness  Mobility Bed Mobility Supine to Sit: 3: Mod assist Transfers Sit to Stand: 1: +2 Total assist Sit to Stand Details (indicate cue type and reason): cues for UE placement, manual facilitation for anterior wt shift, lifting assist Stand to Sit: 1: +2 Total assist Stand Pivot Transfers: 1: +2 Total assist Stand Pivot Transfer Details (indicate cue type and reason): without AD, assist for wt shifts, cues for safety and sequencing Locomotion  Ambulation Ambulation: Yes Ambulation/Gait Assistance: 1: +2 Total assist Ambulation Distance (Feet): 25 Feet Assistive device: 2 person hand held assist Ambulation/Gait Assistance Details: assist for wt shift, cues to advance R LE Stairs / Additional Locomotion Stairs: Yes Stairs Assistance: 3: Mod assist Stairs Assistance Details (indicate cue type and reason): steadying assist, cues for safety, mod A for 1 LOB anteriorly Stair Management Technique: Two rails Number of Stairs: 5  Wheelchair Mobility Wheelchair Mobility: No (deferred due to rotator cuff injury)  Trunk/Postural Assessment  Cervical Assessment Cervical Assessment: Within Functional Limits Thoracic Assessment Thoracic Assessment:  (kyphosis) Lumbar Assessment Lumbar Assessment: Exceptions to [facility] (pt with scoliosis,  posterior pelvic tilt, fwd flexed posture) Postural Control Postural Control: Deficits on evaluation (forward flexed posture in standing, trunk weakness)  Balance Static Sitting Balance Static Sitting - Balance Support: Feet supported Static Sitting - Level of Assistance: 4: Min assist Static Standing Balance Static Standing - Balance Support: During functional activity;Right  upper extremity supported;Left upper extremity supported Static Standing - Level of Assistance: 2: Max assist Static Standing - Comment/# of Minutes: without UE Dynamic Standing Balance Dynamic Standing - Level of Assistance: 1: +2 Total assist (during functional mobility without AD) Extremity Assessment  RUE Assessment RUE Assessment: Within Functional Limits LUE Assessment LUE Assessment: Within Functional Limits RLE Assessment RLE Assessment:  (hamstring tightness, grossly 3/5) LLE Assessment LLE Assessment:  (hamstring tightness, grossly 3/5)  See FIM for current functional status Refer to Care Plan for Long Term Goals  Recommendations for other services: None  Discharge Criteria: Patient will be discharged from PT if patient refuses treatment 3 consecutive times without medical reason, if treatment goals not met, if there is a change in medical status, if patient makes no progress towards goals or if patient is discharged from hospital.  The above assessment, treatment plan, treatment alternatives and goals were discussed and mutually agreed upon: by patient and by family  Treatment initiated during session: Pt performed gait with RW 2 x 20' with min-mod A.  Pt continues with fwd flexed posture and walking on toes (niece says this is premorbid).  SPT with RW with min-mod A, pt requires mod-max A to stand from low surfaces (niece reports he occasionally needed help PTA).  Toilet transfers and bathroom mobility with RW with mod A.  Pt with decreased awareness of deficits, poor working memory and impaired  selective attention.  Able to follow commands, requires increased time for initiation.  Eric [name] 03/15/2012, 2:19 PM

## 2012-03-15 NOTE — Evaluation (Signed)
Occupational Therapy Assessment and Plan  Patient Details  Name: Eric [name] MRN: [id number] Date of Birth: 17-Jan-1926  OT Diagnosis: abnormal posture, acute pain, apraxia, cognitive deficits, muscle weakness (generalized) and pain in joint Rehab Potential:   ELOS: 18-21 days   Today's Date: 03/15/2012 Time: 730-830 Time Calculation (min): 60  Problem List:  Patient Active Problem List  Diagnosis  . HYPERLIPIDEMIA  . OBSTRUCTIVE SLEEP APNEA  . HYPERTENSION  . CAD, AUTOLOGOUS BYPASS GRAFT  . MOBITZ II ATRIOVENTRICULAR BLOCK  . RBBB W/ LAFB  . Atrial fibrillation  . CEREBROVASCULAR DISEASE  . ABDOMINAL AORTIC ANEURYSM  . DEGENERATIVE JOINT DISEASE  . TRANSIENT ISCHEMIC ATTACKS, HX OF  . BENIGN PROSTATIC HYPERTROPHY, HX OF  . PACEMAKER, PERMANENT  . Long term (current) use of anticoagulants  . Pulmonary nodule  . Gastric stasis with food retention  . Recurrent small bowel obstructions  . Chronic diastolic heart failure  . OSA (obstructive sleep apnea)  . Iron deficiency anemia, unspecified  . Gastritis  . Internal hemorrhoids  . ICH (intracerebral hemorrhage)  . Hematoma of frontal scalp  . Pleural effusion on right    Past Medical History:  Past Medical History  Diagnosis Date  . BPH (benign prostatic hypertrophy)   . DJD (degenerative joint disease)   . Hx of transient ischemic attack (TIA)   . AAA (abdominal aortic aneurysm)   . Cerebrovascular disease   . HLD (hyperlipidemia)   . HTN (hypertension)   . Second degree AV block, Mobitz type II     s/p PPM  . CAD (coronary artery disease)   . Persistent atrial fibrillation     on pradaxa  . Sleep apnea     cpap machine  . Gastric ulcer, acute 02/2011    multiple - NSAID/Pradaxa thought causative. associated gastroparesis-like problems  . Small bowel obstruction     multiple over the years  . Anemia   . Anemia    Past Surgical History:  Past Surgical History  Procedure Date  . Pacemaker insertion  01/24/10    Adapta Medtronic   . Coronary artery bypass graft   . Appendectomy   . Gastrectomy   . Knee arthroscopy     right, with medial and lateral meniscal  . Finger surgery      Left middle finger dorsal lesion  . Upper gastrointestinal endoscopy 03/12/2011    stomach ulcer, esophageal erosion  . Esophagogastroduodenoscopy 01/13/2012    Procedure: ESOPHAGOGASTRODUODENOSCOPY (EGD);  Surgeon: Iva Boop, MD;  Location: [facility];  Service: Endoscopy;  Laterality: N/A;  . Flexible sigmoidoscopy 01/13/2012    Procedure: FLEXIBLE SIGMOIDOSCOPY;  Surgeon: Iva Boop, MD;  Location: [facility];  Service: Endoscopy;  Laterality: N/A;  Fleets enema on arrival    Assessment & Plan Clinical Impression: Patient is a 78 y.o. year old male With history of A Fib on xarelto who sustained a ground level fall and struck his head against the curb on 03/08/12. Taken to [facility] where xrays done revealed R-frontal ICH. He was transferred to [facility] and evaluated by Dr. Wynetta Emery who recommended monitoring MS and serial CCT. MS has been stable. Last CT with stable right frontal hemorrhagic contusions. Right shoulder CT with suspicion of large retracted RTC tear with advanced degenerative changes as well as incidental note of large R-pleural effusion and small pulmonary nodules. Pulmonary feels patient with recurrent effusion likely due to heart failure. He has had mild confusion and poor po intake reported. ST evaluation revealed mild  oral transit delay initially. No signs or symptoms of aspiration and patient advanced to regular diet. Acute on chronic anemia to be treated with 2 units PRBC today. Ace resumed by cardiology.   Patient currently requires total with basic self-care skills secondary to muscle weakness, decreased cardiorespiratoy endurance, decreased motor planning, decreased initiation, decreased attention, decreased awareness, decreased problem solving, decreased safety awareness, decreased memory and  delayed processing and decreased sitting balance, decreased standing balance, decreased postural control and decreased balance strategies.  Prior to hospitalization, patient could complete ADLs with min.  Pt's behavior is consistent Rancho Level IV.  Patient will benefit from skilled intervention to decrease level of assist with basic self-care skills and increase independence with basic self-care skills prior to discharge home with care partner.  Anticipate patient will require 24 hour supervision and follow up outpatient.  OT - End of Session Activity Tolerance: Tolerates 30+ min activity with multiple rests Endurance Deficit: Yes OT Assessment Rehab Potential: Good OT Plan OT Frequency: 1-2 X/day, 60-90 minutes Estimated Length of Stay: 18-21 days OT Treatment/Interventions: Balance/vestibular training;Cognitive remediation/compensation;Community reintegration;Discharge planning;DME/adaptive equipment instruction;Functional mobility training;Neuromuscular re-education;Patient/family education;Self Care/advanced ADL retraining;Therapeutic Activities;Therapeutic Exercise;UE/LE Strength taining/ROM;UE/LE Coordination activities;Visual/perceptual remediation/compensation OT Recommendation Follow Up Recommendations: Home health OT  OT Evaluation Precautions/Restrictions  Precautions Precautions: Fall Restrictions Weight Bearing Restrictions: No Pain Pain Assessment Pain Assessment: No/denies pain Home Living/Prior Functioning Home Living Lives With: Spouse Available Help at Discharge: Family Type of Home: House Home Access: Stairs to enter Secretary/administrator of Steps: 2 Entrance Stairs-Rails: Left;Right Home Layout: One level Bathroom Shower/Tub: Other (comment) (walk-in tub) Bathroom Toilet: Handicapped height Bathroom Accessibility: Yes How Accessible: Accessible via walker Home Adaptive Equipment: Walker - rolling;Straight cane Prior Function Level of Independence:  Independent with transfers;Independent with gait (had just progressed from RW and [facility]) Able to Take Stairs?: Yes Driving: No Vocation: Part time employment ADL  See FIM Vision/Perception  Vision - History Baseline Vision: Wears glasses only for reading Vision - Assessment Eye Alignment: Within Functional Limits Additional Comments: difficult to assess due to cognitive deficits; will continue to assess Perception Perception: Impaired Praxis Praxis: Impaired Praxis Impairment Details: Initiation;Perseveration  Cognition Overall Cognitive Status: Impaired Orientation Level: Oriented to person Attention: Selective Selective Attention: Impaired Selective Attention Impairment: Verbal basic;Functional basic Awareness: Impaired Awareness Impairment: Intellectual impairment Behaviors: Perseveration, poor initiation  Safety/Judgment: Impaired Rancho [employer] Scales of Cognitive Functioning: Confused/inappropriate/non-agitated  Sensation Sensation Light Touch: Appears Intact Proprioception: Appears Intact Coordination Gross Motor Movements are Fluid and Coordinated: Yes Motor  Motor Motor: Abnormal postural alignment and control Motor - Skilled Clinical Observations: generalized weakness Mobility  Transfers Sit to Stand: 1: +2 Total assist Sit to Stand Details (indicate cue type and reason): cues for UE placement, manual facilitation for anterior wt shift, lifting assist  Trunk/Postural Assessment  Cervical Assessment Cervical Assessment: Within Functional Limits Thoracic Assessment Thoracic Assessment:  (kyphosis) Lumbar Assessment Lumbar Assessment: Exceptions to [facility] (pt with scoliosis, posterior pelvic tilt, fwd flexed posture) Postural Control Postural Control: Deficits on evaluation (forward flexed posture in standing, trunk weakness)  Balance Static Standing Balance Static Standing - Level of Assistance: 2: Max assist Static Standing - Comment/# of Minutes: without  UE Dynamic Standing Balance Dynamic Standing - Level of Assistance: 1: +2 Total assist (during functional mobility without AD) Extremity/Trunk Assessment RUE Assessment: Within Functional Limits, comment: gout in R hand, possible ligament tear R hand/wrist per MD RUE Assessment: Within Functional Limits LUE Assessment: Within Functional Limits LUE Assessment: Within Functional Limits  See  FIM for current functional status Refer to Care Plan for Long Term Goals  Recommendations for other services: None  Discharge Criteria: Patient will be discharged from OT if patient refuses treatment 3 consecutive times without medical reason, if treatment goals not met, if there is a change in medical status, if patient makes no progress towards goals or if patient is discharged from hospital.  The above assessment, treatment plan, treatment alternatives and goals were discussed and mutually agreed upon: by patient and by family.  1:1 OT eval initiated, purpose and goals discussed with niece. Self care retraining at shower level. Upon entry pt incontinent of urine in bed with no awareness with oral pocketing of food. Functional ambulation to bathroom with HHA with +2 for safety and to encourage upright posture, pt with difficulty with initation and follow through of all task requiring max A. Pt with perseveration with bathing and brushing teeth. Pt with decreased attention requiring mod A for completion of tasks  Jackelyn Poling 03/15/2012, 3:32 PM

## 2012-03-15 NOTE — Progress Notes (Signed)
Patient information reviewed and entered into eRehab system by Claudia Alvizo, RN, CRRN, PPS Coordinator.  Information including medical coding and functional independence measure will be reviewed and updated through discharge.     Per nursing patient was given "Data Collection Information Summary for Patients in Inpatient Rehabilitation Facilities with attached "Privacy Act Statement-Health Care Records" upon admission.  

## 2012-03-15 NOTE — Progress Notes (Addendum)
Physical Therapy Note  Patient Details  Name: Eric [name] MRN: [id number] Date of Birth: 24-Dec-1925 Today's Date: 03/15/2012  1455-1535 (40 minutes) individual Pain: no complaint of pain/no distress noted Orientation : person only Focus of treatment: transfer training, bed mobility training, gait training, bilateral LE strengthening exercises focused on activity tolerance Treatment: Sit to stand max vcs for hand placement min assist sit to stand; transfer min assist RW with decreased step length bilaterally and bilateral hesitation to initiation swing (toe walking); sit to supine mod assist; rolling mod assist ; supine to sit mod assist; Gait 15 feet RW X 2 min assist with decreased step length bilaterally (toe walking); stepping backward max vcs + assist with AD (pt attempts to sit prematurely) ; Nustep Level 3 X 10 minutes .   Saliou Barnier,JIM 03/15/2012, 7:56 AM

## 2012-03-15 NOTE — Progress Notes (Signed)
Speech Language Pathology Daily Session Note  Patient Details  Name: Eric [name] MRN: [id number] Date of Birth: [birth year]  Today's Date: 03/15/2012 Time: 1345-1445 Time Calculation (min): 60 min  Short Term Goals: Week 1: SLP Short Term Goal 1 (Week 1): Pt will orient to place, time and situation with Max A multimodal cueing.  SLP Short Term Goal 2 (Week 1): Pt will sustain attention to a functional task for ~2 minutes with Max A multimodal cueing. SLP Short Term Goal 3 (Week 1): Pt will utilize call bell to express wants/needs with Max A mutlimodal cueing.  SLP Short Term Goal 4 (Week 1): Pt will identify 1 physical and 1 cognitive deficit with Max A multimodal cueing  Skilled Therapeutic Interventions: Treatment focus on assessment of reading comprehension and cognitive goals. Pt required Max A verbal and visual cues for reading comprehension at the word and phrase level while matching pictures. Suspect pt's difficulty is due to decreased attention, working memory and impulsivity. Pt also required total A multimodal cueing for orientation to place, time and situation.  Pt independently reported he needed to use the bathroom and required total A verbal cues for safety awareness and sequencing with transfer. Pt and pt's wife educated on current cognitive goals.    FIM:  Comprehension Comprehension Mode: Auditory Comprehension: 2-Understands basic 25 - 49% of the time/requires cueing 51 - 75% of the time Expression Expression Mode: Verbal Expression: 2-Expresses basic 25 - 49% of the time/requires cueing 50 - 75% of the time. Uses single words/gestures. Social Interaction Social Interaction: 2-Interacts appropriately 25 - 49% of time - Needs frequent redirection. Problem Solving Problem Solving: 1-Solves basic less than 25% of the time - needs direction nearly all the time or does not effectively solve problems and may need a restraint for safety Memory Memory: 1-Recognizes or  recalls less than 25% of the time/requires cueing greater than 75% of the time  Pain Pain Assessment Pain Assessment: No/denies pain  Therapy/Group: Individual Therapy  Taygen Acklin 03/15/2012, 3:59 PM

## 2012-03-15 NOTE — Progress Notes (Addendum)
Subjective/Complaints: Slept well. gnerallized pain. Very alert this am A 12 point review of systems has been performed and if not noted above is otherwise negative.   Objective: Vital Signs: Blood pressure 149/86, pulse 63, temperature 98.9 F (37.2 C), temperature source Oral, resp. rate 20, SpO2 100.00%. Dg Hand Complete Right  03/14/2012  *RADIOLOGY REPORT*  Clinical Data: Bruising, pain, fall  RIGHT HAND - COMPLETE 3+ VIEW  Comparison: None.  Findings: Degenerative arthritic changes of the wrist and carpal bones.  Chondrocalcinosis evident.  Widening of the scapholunate distance suspicious for ligamentous tear in this region.  Findings compatible with CPPD arthropathy.  Similar degenerative changes of the MCP joints involving the first, second and fifth digits.  No acute displaced fracture evident.  No definite focal soft tissue swelling.  IMPRESSION: Advanced arthritic changes of the wrist as described, with chondrocalcinosis, suspect CPPD.  No definite acute fracture.   Original Report Authenticated By: Judie Petit. Ruel Favors, M.D.     Basename 03/15/12 0530 03/14/12 0501  WBC 7.9 7.0  HGB 9.0* 7.7*  HCT 29.0* 25.4*  PLT 157 169    Basename 03/15/12 0530 03/14/12 0501  NA 143 145  K 3.6 3.7  CL 107 111  CO2 27 25  GLUCOSE 106* 107*  BUN 26* 26*  CREATININE 0.94 0.84  CALCIUM 8.9 9.2   CBG (last 3)  No results found for this basename: GLUCAP:3 in the last 72 hours  Wt Readings from Last 3 Encounters:  03/14/12 76 kg (167 lb 8.8 oz)  02/08/12 75.751 kg (167 lb)  02/01/12 76.204 kg (168 lb)    Physical Exam:  Constitutional: He appears well-developed and well-nourished.  HENT:  Head: Normocephalic.  Diffuse ecchymosis on frontal scalp extending to right forehead and right cheek. Minimal left periorbital ecchymosis. Right eyebrow with resolving hematoma with scabs and three sutures.  Eyes: Pupils are equal, round, and reactive to light.  Right scleral hemorrhage  Neck:  Normal range of motion.  Cardiovascular: Normal rate and regular rhythm.  Murmur heard.  Pulmonary/Chest: Effort normal and breath sounds normal.  Abdominal: Soft. Bowel sounds are normal.  Musculoskeletal: He exhibits no edema.  RUE with diffuse ecchymosis at shoulder and biceps area. Right hip/thigh with evidence of hematoma and tenderness mid-thigh. Diffuse ecchymosis right flank to right thigh area. Pain at right scaphoid, with decreased grip. Neurological: He is alert.  [name] Awake and alert. Poor attention.. Able to state DOB but unable to state situation, age, or wife's name even with cueing. Quiet voice with flat affect and delayed initiation and slow movements. Pain inhibition affecting movement of RUE/RLE. Had perhaps slight difficulty with visual acuity through th right eye.  Skin: Skin is warm and dry.  Bruising and pain noted over the right hand. He has decreased grip as a result.    Assessment/Plan: 1. Functional deficits secondary to traumatic brain injury which require 3+ hours per day of interdisciplinary therapy in a comprehensive inpatient rehab setting. Physiatrist is providing close team supervision and 24 hour management of active medical problems listed below. Physiatrist and rehab team continue to assess barriers to discharge/monitor patient progress toward functional and medical goals. FIM:                   Comprehension Comprehension Mode: Auditory Comprehension: 3-Understands basic 50 - 74% of the time/requires cueing 25 - 50%  of the time  Expression Expression Mode: Verbal Expression: 1-Expresses basis less than 25% of the time/requires cueing  greater than 75% of the time.  Social Interaction Social Interaction: 2-Interacts appropriately 25 - 49% of time - Needs frequent redirection.  Problem Solving Problem Solving: 1-Solves basic less than 25% of the time - needs direction nearly all the time or does not effectively solve  problems and may need a restraint for safety    Medical Problem List and Plan:  1. DVT Prophylaxis/Anticoagulation: Mechanical: Sequential compression devices, below knee Bilateral lower extremities  2. Pain Management: denies any pain at current time. Wife reports arthritis "all over"  3. Mood: Monitor for now. Effexor resumed. LCSW to follow up for formal evaluation.  4. Neuropsych: This patient is not capable of making decisions on his/her own behalf.  5. CAD: Low dose Ace resumed.  6. Atrial Fibrillation: Off blood thinner due to ICH. Monitor HR with bid checks.  7. Chronic anemia:hgb back to 9.0 after transfusion. He his baseline hgb is around 9.0 8. OSA: Continue CPAP with one liter O2 whenever sleeping.  9. H/O Gastrectomy with gastroparesis: Small meals. Will add snacks to help with intake.  10. Lethargy:slept better last night. May need low dose stimulant to help with activation.  11. BPH: continue Proscar daily 12. Right wrist: xray is supsicious for pseudogout, and ?scaphulolunate injury. No MRI due PPM. Manage conservatively  -voltaren gel  LOS (Days) 1 A FACE TO FACE EVALUATION WAS PERFORMED  SWARTZ,ZACHARY T 03/15/2012, 8:03 AM

## 2012-03-16 ENCOUNTER — Inpatient Hospital Stay (HOSPITAL_COMMUNITY): Payer: Medicare Other | Admitting: Occupational Therapy

## 2012-03-16 ENCOUNTER — Inpatient Hospital Stay (HOSPITAL_COMMUNITY): Payer: Medicare Other | Admitting: *Deleted

## 2012-03-16 ENCOUNTER — Inpatient Hospital Stay (HOSPITAL_COMMUNITY): Payer: Medicare Other | Admitting: Speech Pathology

## 2012-03-16 MED ORDER — ENALAPRIL MALEATE 5 MG PO TABS
5.0000 mg | ORAL_TABLET | Freq: Two times a day (BID) | ORAL | Status: DC
  Administered 2012-03-16 – 2012-04-03 (×35): 5 mg via ORAL
  Filled 2012-03-16 (×40): qty 1

## 2012-03-16 NOTE — Progress Notes (Signed)
Occupational Therapy Session Note  Patient Details  Name: Eric [name] MRN: [id number] Date of Birth: 1925-11-20  Today's Date: 03/16/2012 Time:  0730- 0830  60 minutes  Short Term Goals: Week 1:  OT Short Term Goal 1 (Week 1): Pt would bathe at shower level with mod cues for initiation OT Short Term Goal 2 (Week 1): Pt will perform UB dressing Mod A. OT Short Term Goal 3 (Week 1): Pt will perform sit <> stands Min A for clothing management. OT Short Term Goal 4 (Week 1): Pt will perform 2 of 3 tasks during toileting. OT Short Term Goal 5 (Week 1): Pt will maintain standing balance with Min A during functional activity for 2 min  Skilled Therapeutic Interventions/Progress Updates:    Self care retraining with bathing at shower level, toileting, and dressing with focus on attention and initiation of tasks. Pt used RW to ambulate to and from BR Max A for progression and to advance walker, Total A for toileting and Max A for toilet and shower transfer, Max A for bed mobility and sit<>stands. Pt required Total A multimodal cuing for initiation and attention to self care tasks; ~4 min to follow through with simple, familiar command (drying off with towel) with Total A verbal cues to complete task. Pt able to orient to place when questioned, pt. stated "hospital," and able to correctly name hospital from choice of two, Total A for orientation to situation.  Therapy Documentation Precautions:  Precautions Precautions: Fall Restrictions Weight Bearing Restrictions: No  Pain: Pt. Denied pain this AM  See FIM for current functional status  Therapy/Group: Individual Therapy  Jackelyn Poling 03/16/2012, 8:47 AM

## 2012-03-16 NOTE — Progress Notes (Signed)
Occupational Therapy Session Note  Patient Details  Name: Eric [name] MRN: [id number] Date of Birth: 11/14/1925  Today's Date: 03/16/2012 Time: 8295-6213 Time Calculation (min): 29 min  Short Term Goals: Week 1:  OT Short Term Goal 1 (Week 1): Pt would bathe at shower level with mod cues for initiation OT Short Term Goal 2 (Week 1): Pt will perform UB dressing Mod A. OT Short Term Goal 3 (Week 1): Pt will perform sit <> stands Min A for clothing management. OT Short Term Goal 4 (Week 1): Pt will perform 2 of 3 tasks during toileting. OT Short Term Goal 5 (Week 1): Pt will maintain standing balance with Min A during functional activity for 2 min  Skilled Therapeutic Interventions/Progress Updates:    Session focused on initiation and attention with card game that is familiar to pt and sorting task. Pt participated in card game (black jack) with Total A multimodal cuing to correctly name card and sum up point value of two cards; downgraded task to correctly naming point value of cards from a field of two and pt required Max A to correctly state which card was higher or lower. Pt sorted cards in field of two (by color) with Max A and 40% accuracy when identifying color and placing in pile. Wife present to observe.  Therapy Documentation Precautions:  Precautions Precautions: Fall Restrictions Weight Bearing Restrictions: No Pain:  no c/o pain See FIM for current functional status  Therapy/Group: Individual Therapy  Jackelyn Poling 03/16/2012, 3:52 PM

## 2012-03-16 NOTE — Progress Notes (Signed)
Subjective/Complaints: Slept well. gnerallized pain. Very alert this am A 12 point review of systems has been performed and if not noted above is otherwise negative.   Objective: Vital Signs: Blood pressure 179/82, pulse 65, temperature 98.1 F (36.7 C), temperature source Oral, resp. rate 17, weight 78.4 kg (172 lb 13.5 oz), SpO2 97.00%. Dg Hand Complete Right  03/14/2012  *RADIOLOGY REPORT*  Clinical Data: Bruising, pain, fall  RIGHT HAND - COMPLETE 3+ VIEW  Comparison: None.  Findings: Degenerative arthritic changes of the wrist and carpal bones.  Chondrocalcinosis evident.  Widening of the scapholunate distance suspicious for ligamentous tear in this region.  Findings compatible with CPPD arthropathy.  Similar degenerative changes of the MCP joints involving the first, second and fifth digits.  No acute displaced fracture evident.  No definite focal soft tissue swelling.  IMPRESSION: Advanced arthritic changes of the wrist as described, with chondrocalcinosis, suspect CPPD.  No definite acute fracture.   Original Report Authenticated By: Judie Petit. Ruel Favors, M.D.     Basename 03/15/12 0530 03/14/12 0501  WBC 7.9 7.0  HGB 9.0* 7.7*  HCT 29.0* 25.4*  PLT 157 169    Basename 03/15/12 0530 03/14/12 0501  NA 143 145  K 3.6 3.7  CL 107 111  CO2 27 25  GLUCOSE 106* 107*  BUN 26* 26*  CREATININE 0.94 0.84  CALCIUM 8.9 9.2   CBG (last 3)  No results found for this basename: GLUCAP:3 in the last 72 hours  Wt Readings from Last 3 Encounters:  03/16/12 78.4 kg (172 lb 13.5 oz)  03/14/12 76 kg (167 lb 8.8 oz)  02/08/12 75.751 kg (167 lb)    Physical Exam:  Constitutional: He appears well-developed and well-nourished.  HENT:  Head: Normocephalic.  Diffuse ecchymosis on frontal scalp extending to right forehead and right cheek. Minimal left periorbital ecchymosis. Right eyebrow with resolving hematoma with scabs and three sutures.  Eyes: Pupils are equal, round, and reactive to light.    Right scleral hemorrhage  Neck: Normal range of motion.  Cardiovascular: Normal rate and regular rhythm.  Murmur heard.  Pulmonary/Chest: Effort normal and breath sounds normal.  Abdominal: Soft. Bowel sounds are normal.  Musculoskeletal: He exhibits no edema.  RUE with diffuse ecchymosis at shoulder and biceps area. Right hip/thigh with evidence of hematoma and tenderness mid-thigh. Diffuse ecchymosis right flank to right thigh area. Pain at right scaphoid, with decreased grip. Right shoulder tender with AROM and PROM Neurological: He is alert.  [name] Awake and alert. Poor attention.. Able to state DOB but unable to state situation, age, or wife's name even with cueing. Quiet voice with flat affect and delayed initiation and slow movements. Pain inhibition affecting movement of RUE/RLE. Had perhaps slight difficulty with visual acuity through th right eye.  Skin: Skin is warm and dry.  Bruising and pain noted over the right hand. He has decreased grip as a result.    Assessment/Plan: 1. Functional deficits secondary to traumatic brain injury which require 3+ hours per day of interdisciplinary therapy in a comprehensive inpatient rehab setting. Physiatrist is providing close team supervision and 24 hour management of active medical problems listed below. Physiatrist and rehab team continue to assess barriers to discharge/monitor patient progress toward functional and medical goals. FIM: FIM - Bathing Bathing Steps Patient Completed: Chest;Right Arm;Left Arm;Abdomen Bathing: 2: Max-Patient completes 3-4 62f 10 parts or 25-49%  FIM - Upper Body Dressing/Undressing Upper body dressing/undressing: 1: Total-Patient completed less than 25% of tasks  FIM - Lower Body Dressing/Undressing Lower body dressing/undressing: 1: Total-Patient completed less than 25% of tasks  FIM - Toileting Toileting: 1: Two helpers  FIM - Archivist Transfers: 1-Two helpers  FIM -  Press photographer: 1: Two helpers  FIM - Locomotion: Printmaker: Wheelchair: 1: Total Assistance/staff pushes wheelchair (Pt<25%) FIM - Locomotion: Ambulation Ambulation/Gait Assistance: 1: +2 Total assist Locomotion: Ambulation: 1: Two helpers  Comprehension Comprehension Mode: Auditory Comprehension: 2-Understands basic 25 - 49% of the time/requires cueing 51 - 75% of the time  Expression Expression Mode: Verbal Expression: 2-Expresses basic 25 - 49% of the time/requires cueing 50 - 75% of the time. Uses single words/gestures.  Social Interaction Social Interaction: 2-Interacts appropriately 25 - 49% of time - Needs frequent redirection.  Problem Solving Problem Solving: 1-Solves basic less than 25% of the time - needs direction nearly all the time or does not effectively solve problems and may need a restraint for safety  Memory Memory: 1-Recognizes or recalls less than 25% of the time/requires cueing greater than 75% of the time Medical Problem List and Plan:  1. DVT Prophylaxis/Anticoagulation: Mechanical: Sequential compression devices, below knee Bilateral lower extremities  2. Pain Management: denies any pain at current time. Wife reports arthritis "all over"  3. Mood: Monitor for now. Effexor resumed. LCSW to follow up for formal evaluation.  4. Neuropsych: This patient is not capable of making decisions on his/her own behalf.  5. CAD: Low dose Ace resumed.  6. Atrial Fibrillation: Off blood thinner due to ICH. Monitor HR with bid checks.  7. Chronic anemia:hgb back to 9.0 after transfusion. He his baseline hgb is around 9.0 8. OSA: Continue CPAP with one liter O2 whenever sleeping.  9. H/O Gastrectomy with gastroparesis: Small meals. Will add snacks to help with intake.  10. Lethargy:sleeping better, but slow to initiate. Not very anxious to start a stimulant here given his multiple medical issues. 11. BPH: continue Proscar daily 12. Right  wrist: xray is supsicious for pseudogout, and ?scaphulolunate injury. No MRI due PPM. Manage conservatively  -voltaren gel  -monitor clinically. Doesn't appear to be having a big impact at present  LOS (Days) 2 A FACE TO FACE EVALUATION WAS PERFORMED  Leonia Heatherly T 03/16/2012, 6:53 AM

## 2012-03-16 NOTE — Progress Notes (Signed)
Social Work Assessment and Plan Social Work Assessment and Plan  Patient Details  Name: Eric [name] MRN: [id number] Date of Birth: 27-Apr-1926  Today's Date: 03/16/2012  Problem List:  Patient Active Problem List  Diagnosis  . HYPERLIPIDEMIA  . OBSTRUCTIVE SLEEP APNEA  . HYPERTENSION  . CAD, AUTOLOGOUS BYPASS GRAFT  . MOBITZ II ATRIOVENTRICULAR BLOCK  . RBBB W/ LAFB  . Atrial fibrillation  . CEREBROVASCULAR DISEASE  . ABDOMINAL AORTIC ANEURYSM  . DEGENERATIVE JOINT DISEASE  . TRANSIENT ISCHEMIC ATTACKS, HX OF  . BENIGN PROSTATIC HYPERTROPHY, HX OF  . PACEMAKER, PERMANENT  . Long term (current) use of anticoagulants  . Pulmonary nodule  . Gastric stasis with food retention  . Recurrent small bowel obstructions  . Chronic diastolic heart failure  . OSA (obstructive sleep apnea)  . Iron deficiency anemia, unspecified  . Gastritis  . Internal hemorrhoids  . ICH (intracerebral hemorrhage)  . Hematoma of frontal scalp  . Pleural effusion on right   Past Medical History:  Past Medical History  Diagnosis Date  . BPH (benign prostatic hypertrophy)   . DJD (degenerative joint disease)   . Hx of transient ischemic attack (TIA)   . AAA (abdominal aortic aneurysm)   . Cerebrovascular disease   . HLD (hyperlipidemia)   . HTN (hypertension)   . Second degree AV block, Mobitz type II     s/p PPM  . CAD (coronary artery disease)   . Persistent atrial fibrillation     on pradaxa  . Sleep apnea     cpap machine  . Gastric ulcer, acute 02/2011    multiple - NSAID/Pradaxa thought causative. associated gastroparesis-like problems  . Small bowel obstruction     multiple over the years  . Anemia   . Anemia    Past Surgical History:  Past Surgical History  Procedure Date  . Pacemaker insertion 01/24/10    Adapta Medtronic   . Coronary artery bypass graft   . Appendectomy   . Gastrectomy   . Knee arthroscopy     right, with medial and lateral meniscal  . Finger surgery        Left middle finger dorsal lesion  . Upper gastrointestinal endoscopy 03/12/2011    stomach ulcer, esophageal erosion  . Esophagogastroduodenoscopy 01/13/2012    Procedure: ESOPHAGOGASTRODUODENOSCOPY (EGD);  Surgeon: Iva Boop, MD;  Location: [facility];  Service: Endoscopy;  Laterality: N/A;  . Flexible sigmoidoscopy 01/13/2012    Procedure: FLEXIBLE SIGMOIDOSCOPY;  Surgeon: Iva Boop, MD;  Location: [facility];  Service: Endoscopy;  Laterality: N/A;  Fleets enema on arrival   Social History:  reports that he quit smoking about 43 years ago. His smoking use included Cigarettes. He has a 5 pack-year smoking history. He has never used smokeless tobacco. He reports that he does not drink alcohol or use illicit drugs.  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 They have no children Ability/Limitations of Caregiver: wife 81 years old. wears b afos. 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  Wife notes his cognition seems to be somewhat improved, however, still "pretty confused".  No s/s of significant emotional distress noted, howver, will monitor as his arousal and cognition improve. Recent Psychosocial Issues: None Pyschiatric History: Wife notes pt's primary MD placed pt on effexor after several hospital admits "because he was kind of depressed" - no formal treament Substance Abuse History: none  Patient / Family Perceptions, Expectations & Goals Pt/Family understanding of illness & functional limitations: Pt's wife with very basic understanding of medical issues as they related to his decreased cognition and functional deficits.  Education to be ongoing. Premorbid pt/family roles/activities: Both pt and wife are very active at home and in the community.   Anticipated changes in roles/activities/participation: Pt will now reqruie 24/7 supervision (at a minimum)) - wife prepared to provide this Pt/family expectations/goals: "i would just like to get him home as soon as I can"  [employer]: None Premorbid Home Care/DME Agencies: None Transportation available at discharge: yes Resource referrals recommended: Neuropsychology;Support group (specify)  Discharge Planning Living Arrangements: Spouse/significant other Support Systems: Spouse/significant other;Other relatives Type of Residence: Private residence Insurance Resources: Administrator (specify) Herbalist) Financial Resources: Social Doctor, hospital Screen Referred: No Living Expenses: Own Money Management: Patient Do you have any problems obtaining your medications?: No Home Management: pt and wife Patient/Family Preliminary Plans: Wife plans for pt to return home with her - she has already secured someone to stay with them at night and may extend this help if needed. Social Work Anticipated Follow Up Needs:  HH/OP;Support Group Expected length of stay: 2-3 weeks  Clinical Impression Unfortunate gentleman here after fall and suffering TBI - wife at bedside and very supportive. Wife able and willing to hire private duty assistance for home.  Excellent support.   Eric [name] 03/16/2012, 2:16 PM

## 2012-03-16 NOTE — Evaluation (Signed)
Speech Language Pathology Bedside Swallow Evaluation   Patient Details  Name: Eric [name] MRN: [id number] Date of Birth: December 25, 1925  SLP Diagnosis: Dysphagia;Cognitive Impairments  Rehab Potential: Good ELOS: 3 weeks   Today's Date: 03/16/2012 Time: 1130-1230 Time Calculation (min): 60 min  Problem List:  Patient Active Problem List  Diagnosis  . HYPERLIPIDEMIA  . OBSTRUCTIVE SLEEP APNEA  . HYPERTENSION  . CAD, AUTOLOGOUS BYPASS GRAFT  . MOBITZ II ATRIOVENTRICULAR BLOCK  . RBBB W/ LAFB  . Atrial fibrillation  . CEREBROVASCULAR DISEASE  . ABDOMINAL AORTIC ANEURYSM  . DEGENERATIVE JOINT DISEASE  . TRANSIENT ISCHEMIC ATTACKS, HX OF  . BENIGN PROSTATIC HYPERTROPHY, HX OF  . PACEMAKER, PERMANENT  . Long term (current) use of anticoagulants  . Pulmonary nodule  . Gastric stasis with food retention  . Recurrent small bowel obstructions  . Chronic diastolic heart failure  . OSA (obstructive sleep apnea)  . Iron deficiency anemia, unspecified  . Gastritis  . Internal hemorrhoids  . ICH (intracerebral hemorrhage)  . Hematoma of frontal scalp  . Pleural effusion on right   Past Medical History:  Past Medical History  Diagnosis Date  . BPH (benign prostatic hypertrophy)   . DJD (degenerative joint disease)   . Hx of transient ischemic attack (TIA)   . AAA (abdominal aortic aneurysm)   . Cerebrovascular disease   . HLD (hyperlipidemia)   . HTN (hypertension)   . Second degree AV block, Mobitz type II     s/p PPM  . CAD (coronary artery disease)   . Persistent atrial fibrillation     on pradaxa  . Sleep apnea     cpap machine  . Gastric ulcer, acute 02/2011    multiple - NSAID/Pradaxa thought causative. associated gastroparesis-like problems  . Small bowel obstruction     multiple over the years  . Anemia   . Anemia    Past Surgical History:  Past Surgical History  Procedure Date  . Pacemaker insertion 01/24/10    Adapta Medtronic   . Coronary artery bypass  graft   . Appendectomy   . Gastrectomy   . Knee arthroscopy     right, with medial and lateral meniscal  . Finger surgery      Left middle finger dorsal lesion  . Upper gastrointestinal endoscopy 03/12/2011    stomach ulcer, esophageal erosion  . Esophagogastroduodenoscopy 01/13/2012    Procedure: ESOPHAGOGASTRODUODENOSCOPY (EGD);  Surgeon: Iva Boop, MD;  Location: [facility];  Service: Endoscopy;  Laterality: N/A;  . Flexible sigmoidoscopy 01/13/2012    Procedure: FLEXIBLE SIGMOIDOSCOPY;  Surgeon: Iva Boop, MD;  Location: [facility];  Service: Endoscopy;  Laterality: N/A;  Fleets enema on arrival    Assessment / Plan / Recommendation Clinical Impression  Pt administered BSE with meal of Dys. 3 textures and thin liquids. Pt demonstrated a mild-moderate oral dysphagia characterized by decreased oral manipulation/cohesion of bolus with moderate oral residue. Pt required Mod verbal, visual and demonstration cues to clear oral residue with a lingual sweep and with a liquid wash. Pt also required moderate verbal cues to decrease impulsivity throughout the meal. Recommend pt continue with current diet of Dys. 3 textures with thin liquids with Full supervision for utilization of swallowing compensatory strategies to increase overall safety. Family  educated on current recommendations and agreed.     SLP Assessment  Patient will need skilled Speech Lanaguage Pathology Services during CIR admission    Recommendations  Follow up Recommendations: 24 hour supervision/assistance;Home Health SLP  Equipment Recommended: None recommended by SLP    SLP Frequency 1-2 X/day, 30-60 minutes   SLP Treatment/Interventions Cognitive remediation/compensation;Dysphagia/aspiration precaution training;Cueing hierarchy;Functional tasks;Environmental controls;Internal/external aids;Speech/Language facilitation;Therapeutic Activities;Patient/family education    Pain No/Denies Pain  Short Term Goals: Week  1: SLP Short Term Goal 1 (Week 1): Pt will orient to place, time and situation with Max A multimodal cueing.  SLP Short Term Goal 2 (Week 1): Pt will sustain attention to a functional task for ~2 minutes with Max A multimodal cueing. SLP Short Term Goal 3 (Week 1): Pt will utilize call bell to express wants/needs with Max A mutlimodal cueing.  SLP Short Term Goal 4 (Week 1): Pt will identify 1 physical and 1 cognitive deficit with Max A multimodal cueing SLP Short Term Goal 5 (Week 1): Pt will utilize swallowing compensatory strategies of slow rate and small bites with max verbal cues.  SLP Short Term Goal 6 (Week 1): Pt will self-monitor and correct oral residue with max verbal and question cues.   See FIM for current functional status Refer to Care Plan for Long Term Goals  Recommendations for other services: None  Discharge Criteria: Patient will be discharged from SLP if patient refuses treatment 3 consecutive times without medical reason, if treatment goals not met, if there is a change in medical status, if patient makes no progress towards goals or if patient is discharged from hospital.  The above assessment, treatment plan, treatment alternatives and goals were discussed and mutually agreed upon: by patient and by family  Tayjah Lobdell 03/16/2012, 4:09 PM

## 2012-03-16 NOTE — Progress Notes (Addendum)
Physical Therapy Session Note  Patient Details  Name: Eric [name] MRN: [id number] Date of Birth: 23-May-1926  Today's Date: 03/16/2012 Time: 0900-1000 Time Calculation (min): 60 min  Short Term Goals: Week 1:  PT Short Term Goal 1 (Week 1): Pt will perform functional transfers with min A PT Short Term Goal 1 - Progress (Week 1): Progressing toward goal PT Short Term Goal 2 (Week 1): Pt will demo selective attention to functional task x 1 minute with min A PT Short Term Goal 2 - Progress (Week 1): Progressing toward goal PT Short Term Goal 3 (Week 1): Pt will gait in controlled environment with min A 50' PT Short Term Goal 3 - Progress (Week 1): Progressing toward goal  Skilled Therapeutic Interventions/Progress Updates:     denies pain, family states pt did not like to admit to pain premorbidly, RN applied Voltaren to R wrist  Family present and pt answering some questions about himself incorrectly, initially appeared sleepy(educated on TBI and sleepiness) but awake for full one hour session  Therapeutic activity- incorporated cognition- able to identify a number or color but not follow a more complex command to involve it (ie put 2 horseshoes on the 2); min to max cues to simply move item from one chair to another in seated or standing position, no LOB seated, min-mod A standing in flexed position; decreased selective attention to task - mod cues in min distractive environment; unable to play tic tac toe or draw a circle where PT pointed; addressed balance with gait without device- pt tending to grab nearby objects- gave cane in R hand and HHA on L, did well with rhythmic cues to keep stepping with overall mod A in controlled environment HR 80 20 x 3, 40 x 2- physical assist to correct LOB d/t impaired balance strategies (many related to prior status, scoliosis etc)  Unable to state any deficits  Therapy Documentation Precautions:  Precautions Precautions: Fall Restrictions Weight  Bearing Restrictions: No See FIM for current functional status  Therapy/Group: Individual Therapy  Michaelene Song 03/16/2012, 12:09 PM

## 2012-03-17 ENCOUNTER — Inpatient Hospital Stay (HOSPITAL_COMMUNITY): Payer: Medicare Other | Admitting: Speech Pathology

## 2012-03-17 ENCOUNTER — Inpatient Hospital Stay (HOSPITAL_COMMUNITY): Payer: Medicare Other | Admitting: Physical Therapy

## 2012-03-17 ENCOUNTER — Inpatient Hospital Stay (HOSPITAL_COMMUNITY): Payer: Medicare Other

## 2012-03-17 ENCOUNTER — Encounter (HOSPITAL_COMMUNITY): Payer: Medicare Other | Admitting: Occupational Therapy

## 2012-03-17 DIAGNOSIS — S069X9A Unspecified intracranial injury with loss of consciousness of unspecified duration, initial encounter: Secondary | ICD-10-CM

## 2012-03-17 DIAGNOSIS — W19XXXA Unspecified fall, initial encounter: Secondary | ICD-10-CM

## 2012-03-17 DIAGNOSIS — Z5189 Encounter for other specified aftercare: Secondary | ICD-10-CM

## 2012-03-17 MED ORDER — ASPIRIN EC 325 MG PO TBEC
325.0000 mg | DELAYED_RELEASE_TABLET | Freq: Every day | ORAL | Status: DC
  Administered 2012-03-17 – 2012-04-03 (×18): 325 mg via ORAL
  Filled 2012-03-17 (×19): qty 1

## 2012-03-17 MED ORDER — ACETAMINOPHEN 325 MG PO TABS
650.0000 mg | ORAL_TABLET | Freq: Three times a day (TID) | ORAL | Status: DC
  Administered 2012-03-17 – 2012-04-01 (×34): 650 mg via ORAL
  Filled 2012-03-17 (×36): qty 2

## 2012-03-17 MED ORDER — DIPHENHYDRAMINE HCL 25 MG PO CAPS: 25.0000 mg | ORAL_CAPSULE | Freq: Every evening | ORAL | Status: DC | PRN

## 2012-03-17 NOTE — Progress Notes (Signed)
Physical Therapy Session Note  Patient Details  Name: Eric [name] MRN: [id number] Date of Birth: [birth year]  Today's Date: 03/17/2012 Time: 1914-7829 Time Calculation (min): 45 min  Short Term Goals: Week 1:  PT Short Term Goal 1 (Week 1): Pt will perform functional transfers with min A PT Short Term Goal 1 - Progress (Week 1): Progressing toward goal PT Short Term Goal 2 (Week 1): Pt will demo selective attention to functional task x 1 minute with min A PT Short Term Goal 2 - Progress (Week 1): Progressing toward goal PT Short Term Goal 3 (Week 1): Pt will gait in controlled environment with min A 50' PT Short Term Goal 3 - Progress (Week 1): Progressing toward goal  Skilled Therapeutic Interventions/Progress Updates:    pt denies pain.  bil rear wheels on W/C flat.  Fitted pt with new W/C.  Sit to stands with ModA, max cueing for use of UEs and facilitating anterior wt shift over BOS.  Amb with RW ModA 70', 30', and 40'.  Cues and facilitation for upright posture, positioning and use of RW.  Pt requires A moving RW with turns.  Standing balance with cognitive tasks identifying objects in pictures.  Pt needed Max cueing for cognitive task.  Stand to sits with ModA and required hand over hand cueing for reaching back for armrest of W/C.    Therapy Documentation Precautions:  Precautions Precautions: Fall Restrictions Weight Bearing Restrictions: No  See FIM for current functional status  Therapy/Group: Individual Therapy  Drezden Seitzinger, Alison Murray 03/17/2012, 12:02 PM

## 2012-03-17 NOTE — Progress Notes (Signed)
Subjective/Complaints: Slept fairly well. Some bleeding from wounds as the cpap mask/strap is hitting the area.  A 12 point review of systems has been performed and if not noted above is otherwise negative.   Objective: Vital Signs: Blood pressure 161/85, pulse 67, temperature 97.7 F (36.5 C), temperature source Oral, resp. rate 17, weight 76.4 kg (168 lb 6.9 oz), SpO2 93.00%. No results found.  Basename 03/15/12 0530  WBC 7.9  HGB 9.0*  HCT 29.0*  PLT 157    Basename 03/15/12 0530  NA 143  K 3.6  CL 107  CO2 27  GLUCOSE 106*  BUN 26*  CREATININE 0.94  CALCIUM 8.9   CBG (last 3)  No results found for this basename: GLUCAP:3 in the last 72 hours  Wt Readings from Last 3 Encounters:  03/17/12 76.4 kg (168 lb 6.9 oz)  03/14/12 76 kg (167 lb 8.8 oz)  02/08/12 75.751 kg (167 lb)    Physical Exam:  Constitutional: He appears well-developed and well-nourished.  HENT:  Head: Normocephalic.  Diffuse ecchymosis on frontal scalp extending to right forehead and right cheek. Minimal left periorbital ecchymosis. Right eyebrow with resolving hematoma with scabs-some drainage. Eyes: Pupils are equal, round, and reactive to light.  Right scleral hemorrhage  Neck: Normal range of motion.  Cardiovascular: Normal rate and regular rhythm.  Murmur heard.  Pulmonary/Chest: Effort normal and breath sounds normal.  Abdominal: Soft. Bowel sounds are normal.  Musculoskeletal: He exhibits no edema.  RUE with diffuse ecchymosis at shoulder and biceps area. Right hip/thigh with evidence of hematoma and tenderness mid-thigh. Diffuse ecchymosis right flank to right thigh area. Pain at right scaphoid, with decreased grip. Right shoulder tender with AROM and PROM Neurological: He is alert.  [name] Awake and alert. Poor attention.. Able to state DOB but unable to state situation, place. Doesn't remember my name. Quiet voice with flat affect and delayed initiation and slow  movements. Pain inhibition affecting movement of RUE/RLE. Skin: Skin is warm and dry.  Bruising and pain noted over the right hand. He has decreased grip as a result. No change   Assessment/Plan: 1. Functional deficits secondary to traumatic brain injury which require 3+ hours per day of interdisciplinary therapy in a comprehensive inpatient rehab setting. Physiatrist is providing close team supervision and 24 hour management of active medical problems listed below. Physiatrist and rehab team continue to assess barriers to discharge/monitor patient progress toward functional and medical goals. FIM: FIM - Bathing Bathing Steps Patient Completed: Chest;Right Arm;Left Arm;Abdomen;Right upper leg;Left upper leg Bathing: 3: Mod-Patient completes 5-7 43f 10 parts or 50-74%  FIM - Upper Body Dressing/Undressing Upper body dressing/undressing steps patient completed: Thread/unthread right sleeve of pullover shirt/dresss;Thread/unthread left sleeve of pullover shirt/dress Upper body dressing/undressing: 3: Mod-Patient completed 50-74% of tasks FIM - Lower Body Dressing/Undressing Lower body dressing/undressing: 1: Total-Patient completed less than 25% of tasks  FIM - Toileting Toileting: 0: Activity did not occur  FIM - Diplomatic Services operational officer Devices: Grab bars;Walker Toilet Transfers: 2-To toilet/BSC: Max A (lift and lower assist);2-From toilet/BSC: Max A (lift and lower assist)  FIM - Banker Devices: Manufacturing systems engineer Transfer: 2: Supine > Sit: Max A (lifting assist/Pt. 25-49%);2: Bed > Chair or W/C: Max A (lift and lower assist)  FIM - Locomotion: Wheelchair Locomotion: Wheelchair: 1: Total Assistance/staff pushes wheelchair (Pt<25%) FIM - Locomotion: Ambulation Locomotion: Ambulation Assistive Devices: Emergency planning/management officer Ambulation/Gait Assistance: 3: Mod assist Locomotion: Ambulation: 1: Travels less than  50 ft with moderate  assistance (Pt: 50 - 74%)  Comprehension Comprehension Mode: Auditory Comprehension: 2-Understands basic 25 - 49% of the time/requires cueing 51 - 75% of the time  Expression Expression Mode: Verbal Expression: 1-Expresses basis less than 25% of the time/requires cueing greater than 75% of the time.  Social Interaction Social Interaction: 2-Interacts appropriately 25 - 49% of time - Needs frequent redirection.  Problem Solving Problem Solving: 1-Solves basic less than 25% of the time - needs direction nearly all the time or does not effectively solve problems and may need a restraint for safety  Memory Memory: 1-Recognizes or recalls less than 25% of the time/requires cueing greater than 75% of the time Medical Problem List and Plan:  1. DVT Prophylaxis/Anticoagulation: Mechanical: Sequential compression devices, below knee Bilateral lower extremities  2. Pain Management: denies any pain at current time. Wife reports arthritis "all over"  3. Mood: Monitor for now. Effexor resumed. LCSW to follow up for formal evaluation.  4. Neuropsych: This patient is not capable of making decisions on his/her own behalf.  5. CAD: Low dose Ace resumed.  6. Atrial Fibrillation: Off blood thinner due to ICH. Monitor HR with bid checks.  7. Chronic anemia:hgb back to 9.0 after transfusion. He his baseline hgb is around 9.0 8. OSA: Continue CPAP with one liter O2 whenever sleeping.  9. H/O Gastrectomy with gastroparesis: Small meals. Will add snacks to help with intake.  10. Lethargy:sleeping better, but slow to initiate. Not very anxious to start a stimulant here given his multiple medical issues. 11. BPH: continue Proscar daily 12. Right wrist: xray is supsicious for pseudogout, and ?scaphulolunate injury. No MRI due PPM. Manage conservatively  -voltaren gel  -monitor clinically. Doesn't appear to be having a big impact at present and his pain seems to be more in the digits. 13. Wounds: local  care  LOS (Days) 3 A FACE TO FACE EVALUATION WAS PERFORMED  Eric [name] T 03/17/2012, 7:08 AM

## 2012-03-17 NOTE — Progress Notes (Signed)
Physical Therapy Session Note  Patient Details  Name: Eric [name] MRN: [id number] Date of Birth: [birth year]  Today's Date: 03/17/2012 Time: 1478-2956 Time Calculation (min): 45 min  Short Term Goals: Week 1:  PT Short Term Goal 1 (Week 1): Pt will perform functional transfers with min A PT Short Term Goal 1 - Progress (Week 1): Progressing toward goal PT Short Term Goal 2 (Week 1): Pt will demo selective attention to functional task x 1 minute with min A PT Short Term Goal 2 - Progress (Week 1): Progressing toward goal PT Short Term Goal 3 (Week 1): Pt will gait in controlled environment with min A 50' PT Short Term Goal 3 - Progress (Week 1): Progressing toward goal  Skilled Therapeutic Interventions/Progress Updates:    Session focused on improved attention, initiation and functional sit <> stands with decreased assist. Practiced locking/unlocking brakes however largely unsuccessful likely secondary to being a new task. Performed repeated sit <> stands from decreasing mat height. Mod progressing to min/supervision assist. By end of session pt min/supervision assist to stand from wheelchair, max verbal/tactile cues for correct hand placement. Three stand pivots performed with mod progressing to min physical assist, max verbal/tactile cues for sequencing and initiation. Some intrasession carryover noted.   Pt reported near to end of session that the "room looked dark." Unable to describe if related internal or external factors. BP = 156/83, SpO2 = 94% on room air. RN called to gym, pt did not appear different from start of treatment, RN to monitor. At end of session pt reports room looked "clear."  Therapy Documentation Precautions:  Precautions Precautions: Fall Restrictions Weight Bearing Restrictions: No Pain: Pain Assessment Pain Assessment: No/denies pain  See FIM for current functional status  Therapy/Group: Individual Therapy  Wilhemina Bonito 03/17/2012, 4:26  PM

## 2012-03-17 NOTE — Progress Notes (Signed)
Occupational Therapy Session Note  Patient Details  Name: JAYZON TARAS MRN: [id number] Date of Birth: 18-Jul-1925  Today's Date: 03/17/2012  Session 1 Time: 4098-1191 Time Calculation (min): 45 min  Short Term Goals: Week 1:  OT Short Term Goal 1 (Week 1): Pt would bathe at shower level with mod cues for initiation OT Short Term Goal 2 (Week 1): Pt will perform UB dressing Mod A. OT Short Term Goal 3 (Week 1): Pt will perform sit <> stands Min A for clothing management. OT Short Term Goal 4 (Week 1): Pt will perform 2 of 3 tasks during toileting. OT Short Term Goal 5 (Week 1): Pt will maintain standing balance with Min A during functional activity for 2 min  Skilled Therapeutic Interventions/Progress Updates:    Pt seated in w/c with niece at side upon arrival.  Pt's wife arrived after session started to observe therapy session.  Pt engaged in bathing and dressing tasks w/c level at sink.  Pt required max verbal cues with occasional tactile cues to initiate bathing tasks.  When requested to initiate tasks pt would acknowledge request but would not initiate.  Pt perseverated on washing face and exhibited no awareness of injury to right forehead, attempting multiple times to bathe over dressing.  Pt performed sit<>stand at sink to bathe front perineal area and buttocks.  Pt required assistance bathing buttocks.  Pt unable to reach forward to bathe feet.  Pt's wife explained that this was a difficult task prior to hospitalization secondary to pt's scoliosis.  Pt stated he needed to use bathroom and amb with RW to bathroom.  Pt incontinent of bladder while walking into bathroom.  Pt initiated dressing tasks when presented with clothing items but required assistance pulling shirt over head.  Wife again stated that she often had to assist pt with this function.  Pt was total assist for LB dressing although he did initiate tasks but was unable to complete.  Focus on task initiation and termination,  standing balance, activity tolerance, and safety awareness.  Therapy Documentation Precautions:  Precautions Precautions: Fall Restrictions Weight Bearing Restrictions: No    See FIM for current functional status  Therapy/Group: Individual Therapy  Session 2 Time: 1415-1445 Pt denies pain Individual therapy  Pt in bed with wife at bedside upon arrival.  Pt required max verbal and tactile cues for arousal and initiating sitting EOB.  Pt required max A to sit EOB.  Pt performed sit<>stand from EOB X 3.  Initially pt exhibited difficulty maintaining standing balance and sat back down on EOB.  Pt amb with RW to sink.  Pt required mod A with max verbal cues to advance RW.  Pt performed sit<>stand at sink X 4 with increased ability to perform task with less assistance.  Pt not oriented to place or city when offered 2 options.  Pt could not read date on calendar.  Pt unable to recall birth year or correct age.  Pt unable to recall his occupation.  Presented pt with visual cues to assist pt with orientation.    Lavone Neri [facility] 03/17/2012, 11:04 AM

## 2012-03-17 NOTE — Progress Notes (Signed)
Speech Language Pathology Daily Session Note  Patient Details  Name: Eric [name] MRN: [id number] Date of Birth: [birth year]  Today's Date: 03/17/2012 Time: 7829-5621 Time Calculation (min): 45 min  Short Term Goals: Week 1: SLP Short Term Goal 1 (Week 1): Pt will orient to place, time and situation with Max A multimodal cueing.  SLP Short Term Goal 2 (Week 1): Pt will sustain attention to a functional task for ~2 minutes with Max A multimodal cueing. SLP Short Term Goal 3 (Week 1): Pt will utilize call bell to express wants/needs with Max A mutlimodal cueing.  SLP Short Term Goal 4 (Week 1): Pt will identify 1 physical and 1 cognitive deficit with Max A multimodal cueing SLP Short Term Goal 5 (Week 1): Pt will utilize swallowing compensatory strategies of slow rate and small bites with max verbal cues.  SLP Short Term Goal 6 (Week 1): Pt will self-monitor and correct oral residue with max verbal and question cues.   Skilled Therapeutic Interventions: Treatment focus on sustained attention, orientation and working memory. Pt demonstrated lethargy and required Max verbal and tactile cues to attend to various tasks throughout session for ~30-60 secs, however, pt demonstrated sustained attention to functional task of brushing his teeth for ~ 2 minutes but also required Max A verbal and visual cues to decrease perseveration of task. Pt required Max multimodal cueing for orientation to place, time and situation with focus on external memory aids to increase working memory/ carryover of information.    FIM:  Comprehension Comprehension Mode: Auditory Comprehension: 2-Understands basic 25 - 49% of the time/requires cueing 51 - 75% of the time Expression Expression Mode: Verbal Expression: 2-Expresses basic 25 - 49% of the time/requires cueing 50 - 75% of the time. Uses single words/gestures. Social Interaction Social Interaction: 1-Interacts appropriately less than 25% of the time. May be  withdrawn or combative. Problem Solving Problem Solving: 1-Solves basic less than 25% of the time - needs direction nearly all the time or does not effectively solve problems and may need a restraint for safety Memory Memory: 1-Recognizes or recalls less than 25% of the time/requires cueing greater than 75% of the time  Pain Pain Assessment Pain Assessment: No/denies pain  Therapy/Group: Individual Therapy  Renelda Kilian 03/17/2012, 3:04 PM

## 2012-03-18 ENCOUNTER — Inpatient Hospital Stay (HOSPITAL_COMMUNITY): Payer: Medicare Other | Admitting: Physical Therapy

## 2012-03-18 ENCOUNTER — Inpatient Hospital Stay (HOSPITAL_COMMUNITY): Payer: Medicare Other

## 2012-03-18 ENCOUNTER — Inpatient Hospital Stay (HOSPITAL_COMMUNITY): Payer: Medicare Other | Admitting: Speech Pathology

## 2012-03-18 NOTE — Progress Notes (Signed)
Speech Language Pathology Daily Session Note  Patient Details  Name: Eric [name] MRN: [id number] Date of Birth: 10/17/1925  Today's Date: 03/18/2012 Time: 7829-5621 Time Calculation (min): 45 min  Short Term Goals: Week 1: SLP Short Term Goal 1 (Week 1): Pt will orient to place, time and situation with Max A multimodal cueing.  SLP Short Term Goal 2 (Week 1): Pt will sustain attention to a functional task for ~2 minutes with Max A multimodal cueing. SLP Short Term Goal 3 (Week 1): Pt will utilize call bell to express wants/needs with Max A mutlimodal cueing.  SLP Short Term Goal 4 (Week 1): Pt will identify 1 physical and 1 cognitive deficit with Max A multimodal cueing SLP Short Term Goal 5 (Week 1): Pt will utilize swallowing compensatory strategies of slow rate and small bites with max verbal cues.  SLP Short Term Goal 6 (Week 1): Pt will self-monitor and correct oral residue with max verbal and question cues.   Skilled Therapeutic Interventions: Treatment focus on cognitive and dysphagia goals. Pt demonstrated increased arousal and alertness with a brighter affect. Pt also demonstrated an increase in social interaction in regards to social interaction with increased vocal intensity during functional communication with staff, family members and friends while speaking on the phone.  Pt required Max verbal and visual cues for orientation to place and situation, however, demonstrated recall of situation independently at the end of the session. Pt also demonstrated increased orientation to person (age, birthday, etc)  and required supervision question cues to appropriately identify family members in photos. Pt consumed regular textures with efficient mastication but required Min verbal cues for small bites and selective attention to task in a moderately distracting environment. Family members educated on current swallowing compensatory strategies and verbalized understanding. Recommend pt  upgrade to regular textures.    FIM:  Comprehension Comprehension Mode: Auditory Comprehension: 3-Understands basic 50 - 74% of the time/requires cueing 25 - 50%  of the time Expression Expression Mode: Verbal Expression: 3-Expresses basic 50 - 74% of the time/requires cueing 25 - 50% of the time. Needs to repeat parts of sentences. Social Interaction Social Interaction: 3-Interacts appropriately 50 - 74% of the time - May be physically or verbally inappropriate. Problem Solving Problem Solving: 1-Solves basic less than 25% of the time - needs direction nearly all the time or does not effectively solve problems and may need a restraint for safety Memory Memory: 2-Recognizes or recalls 25 - 49% of the time/requires cueing 51 - 75% of the time FIM - Eating Eating Activity: 5: Supervision/cues  Pain Pain Assessment Pain Assessment: No/denies pain Pain Score: 0-No pain  Therapy/Group: Individual Therapy  Mareesa Gathright 03/18/2012, 11:18 AM

## 2012-03-18 NOTE — Progress Notes (Signed)
Physical Therapy Note  Patient Details  Name: Eric [name] MRN: [id number] Date of Birth: [birth year] Today's Date: 03/18/2012  Session #1- 1115 - 1155 (40 minutes) individual Pain: no complaint of pain Focus of treatment: Therapeutic activities focused on standing alignment/balance; gait training with /without AD Treatment: Stand Ane Payment transfers with RW min/mod assist sit to stand with max vcs for hand placement; Gait 25 feet with hands on therapist shoulders to facilitate erect stance and increase step length bilaterally mod assist; gait with RW 25 feet min/mod assist; standing - pt stands with forward flexed hips/trunk and RT knee and loss of balance to right or posteriorly without assist; standing to bedside table performing shoulder arc apparatus min/mod assist.  Session #2 1400-1430 (30 minutes) individual Pain: no reported pain Focus of session: gait training; toilet transfers Treatment: pt incontinent of bowel/bladder; stand/pivot transfer to toilet from wc using safety rail min assist; total assist with hygiene; gait 55 feet RW min assist +  max vcs for direction finding (where did we come from , where are we going) + assist with RW for changing directions. Pt leans heavily on AD.  Auna Mikkelsen,JIM 03/18/2012, 7:56 AM

## 2012-03-18 NOTE — Progress Notes (Signed)
Subjective/Complaints: Slept fairly well again.    A 12 point review of systems has been performed and if not noted above is otherwise negative.   Objective: Vital Signs: Blood pressure 148/88, pulse 71, temperature 97.2 F (36.2 C), temperature source Axillary, resp. rate 17, weight 77.4 kg (170 lb 10.2 oz), SpO2 92.00%. No results found. No results found for this basename: WBC:2,HGB:2,HCT:2,PLT:2 in the last 72 hours No results found for this basename: NA:2,K:2,CL:2,CO2:2,GLUCOSE:2,BUN:2,CREATININE:2,CALCIUM:2 in the last 72 hours CBG (last 3)  No results found for this basename: GLUCAP:3 in the last 72 hours  Wt Readings from Last 3 Encounters:  03/18/12 77.4 kg (170 lb 10.2 oz)  03/14/12 76 kg (167 lb 8.8 oz)  02/08/12 75.751 kg (167 lb)    Physical Exam:  Constitutional: He appears well-developed and well-nourished.  HENT:  Head: Normocephalic.  Diffuse ecchymosis on frontal scalp extending to right forehead and right cheek. Minimal left periorbital ecchymosis. Right eyebrow with resolving hematoma with scabs-some drainage. Eyes: Pupils are equal, round, and reactive to light.  Right scleral hemorrhage  Neck: Normal range of motion.  Cardiovascular: Normal rate and regular rhythm.  Murmur heard.  Pulmonary/Chest: Effort normal and breath sounds normal.  Abdominal: Soft. Bowel sounds are normal.  Musculoskeletal: He exhibits no edema.  RUE with diffuse ecchymosis at shoulder and biceps area. Right hip/thigh with evidence of hematoma and tenderness mid-thigh. Diffuse ecchymosis right flank to right thigh area. Pain at right scaphoid, with decreased grip. Right shoulder tender with AROM and PROM Neurological: He is alert.  [name] Awake and alert. better attention.. Able to state DOB but unable to state situation, place. Doesn't remember my name. Quiet voice with flat affect and delayed initiation and slow movements. Pain inhibition affecting movement of  RUE/RLE. Skin: Skin is warm and dry.  Bruising and pain noted over the right hand. He has decreased grip as a result. No change   Assessment/Plan: 1. Functional deficits secondary to traumatic brain injury which require 3+ hours per day of interdisciplinary therapy in a comprehensive inpatient rehab setting. Physiatrist is providing close team supervision and 24 hour management of active medical problems listed below. Physiatrist and rehab team continue to assess barriers to discharge/monitor patient progress toward functional and medical goals.  Spoke with family at length regarding BI recovery  FIM: FIM - Bathing Bathing Steps Patient Completed: Chest;Right Arm;Left Arm;Abdomen;Right upper leg;Left upper leg;Front perineal area Bathing: 3: Mod-Patient completes 5-7 26f 10 parts or 50-74%  FIM - Upper Body Dressing/Undressing Upper body dressing/undressing steps patient completed: Thread/unthread right sleeve of pullover shirt/dresss;Thread/unthread left sleeve of pullover shirt/dress Upper body dressing/undressing: 3: Mod-Patient completed 50-74% of tasks FIM - Lower Body Dressing/Undressing Lower body dressing/undressing: 1: Total-Patient completed less than 25% of tasks  FIM - Toileting Toileting: 1: Total-Patient completed zero steps, helper did all 3  FIM - Diplomatic Services operational officer Devices: Best boy Transfers: 3-To toilet/BSC: Mod A (lift or lower assist);3-From toilet/BSC: Mod A (lift or lower assist)  FIM - Bed/Chair Transfer Bed/Chair Transfer Assistive Devices: Therapist, occupational: 3: Bed > Chair or W/C: Mod A (lift or lower assist);3: Chair or W/C > Bed: Mod A (lift or lower assist)  FIM - Locomotion: Wheelchair Locomotion: Wheelchair: 1: Total Assistance/staff pushes wheelchair (Pt<25%) FIM - Locomotion: Ambulation Locomotion: Ambulation Assistive Devices: Designer, industrial/product Ambulation/Gait Assistance: 3: Mod assist Locomotion:  Ambulation: 2: Travels 50 - 149 ft with moderate assistance (Pt: 50 - 74%)  Comprehension Comprehension Mode:  Auditory Comprehension: 2-Understands basic 25 - 49% of the time/requires cueing 51 - 75% of the time  Expression Expression Mode: Verbal Expression: 2-Expresses basic 25 - 49% of the time/requires cueing 50 - 75% of the time. Uses single words/gestures.  Social Interaction Social Interaction: 1-Interacts appropriately less than 25% of the time. May be withdrawn or combative.  Problem Solving Problem Solving: 1-Solves basic less than 25% of the time - needs direction nearly all the time or does not effectively solve problems and may need a restraint for safety  Memory Memory: 1-Recognizes or recalls less than 25% of the time/requires cueing greater than 75% of the time Medical Problem List and Plan:  1. DVT Prophylaxis/Anticoagulation: Mechanical: Sequential compression devices, below knee Bilateral lower extremities  2. Pain Management: denies any pain at current time. Wife reports arthritis "all over"  3. Mood: Monitor for now. Effexor resumed. LCSW to follow up for formal evaluation.  4. Neuropsych: This patient is not capable of making decisions on his/her own behalf.  5. CAD: Low dose Ace resumed.  6. Atrial Fibrillation: Off blood thinner due to ICH. Monitor HR with bid checks.  7. Chronic anemia:hgb back to 9.0 after transfusion. He his baseline hgb is around 9.0 8. OSA: Continue CPAP with one liter O2 whenever sleeping.  9. H/O Gastrectomy with gastroparesis: Small meals. Will add snacks to help with intake.  10. Lethargy:sleeping better, but slow to initiate. Not very anxious to start a stimulant here given his multiple medical issues. 11. BPH: continue Proscar daily 12. Right wrist: xray is supsicious for pseudogout, and ?scaphulolunate injury. No MRI due PPM. Manage conservatively  -voltaren gel  -monitor clinically. Doesn't appear to be having a big impact at  present and his pain seems to be more in the digits. 13. Wounds: local care  LOS (Days) 4 A FACE TO FACE EVALUATION WAS PERFORMED  Lowell Makara T 03/18/2012, 8:03 AM

## 2012-03-18 NOTE — Progress Notes (Signed)
Occupational Therapy Session Note  Patient Details  Name: Eric [name] MRN: [id number] Date of Birth: Mar 14, 1926  Today's Date: 03/18/2012 Time: 0800-0900 Time Calculation (min): 60 min  Short Term Goals: Week 1:  OT Short Term Goal 1 (Week 1): Pt would bathe at shower level with mod cues for initiation OT Short Term Goal 2 (Week 1): Pt will perform UB dressing Mod A. OT Short Term Goal 3 (Week 1): Pt will perform sit <> stands Min A for clothing management. OT Short Term Goal 4 (Week 1): Pt will perform 2 of 3 tasks during toileting. OT Short Term Goal 5 (Week 1): Pt will maintain standing balance with Min A during functional activity for 2 min  Skilled Therapeutic Interventions: ADL-retraining at shower-level with emphasis on functional mobility, functional endurance, standing balance, cognitive retraining: (initiation, attention, sequencing), and family re-ed on OT treatment methods and goals.   Patient completed bathing with verbal cues to progress and use DME for safety and steadying assist while standing for peri-care.   Patient able to don left sock and shoe to include tying laces while sitting at edge of bed with only supervision assist to cue and provide clothing items.  Patient required assist to donning right sock but donned shoe and again tied laces w/o assist.    Therapy Documentation Precautions:  Precautions Precautions: Fall Restrictions Weight Bearing Restrictions: No  Pain: Pain Assessment Pain Assessment: No/denies pain  See FIM for current functional status  Therapy/Group: Individual Therapy  Georgeanne Nim 03/18/2012, 12:30 PM

## 2012-03-19 ENCOUNTER — Inpatient Hospital Stay (HOSPITAL_COMMUNITY): Payer: Medicare Other | Admitting: Physical Therapy

## 2012-03-19 NOTE — Progress Notes (Signed)
Subjective/Complaints: Had a good night and day yesterday A 12 point review of systems has been performed and if not noted above is otherwise negative.   Objective: Vital Signs: Blood pressure 154/88, pulse 62, temperature 97.7 F (36.5 C), temperature source Oral, resp. rate 18, weight 74.1 kg (163 lb 5.8 oz), SpO2 98.00%. No results found. No results found for this basename: WBC:2,HGB:2,HCT:2,PLT:2 in the last 72 hours No results found for this basename: NA:2,K:2,CL:2,CO2:2,GLUCOSE:2,BUN:2,CREATININE:2,CALCIUM:2 in the last 72 hours CBG (last 3)  No results found for this basename: GLUCAP:3 in the last 72 hours  Wt Readings from Last 3 Encounters:  03/19/12 74.1 kg (163 lb 5.8 oz)  03/14/12 76 kg (167 lb 8.8 oz)  02/08/12 75.751 kg (167 lb)    Physical Exam:  Constitutional: He appears well-developed and well-nourished.  HENT:  Head: Normocephalic.  Diffuse ecchymosis on frontal scalp extending to right forehead and right cheek. Minimal left periorbital ecchymosis. Right eyebrow with resolving hematoma with scabs-some drainage. Eyes: Pupils are equal, round, and reactive to light.  Right scleral hemorrhage  Neck: Normal range of motion.  Cardiovascular: Normal rate and regular rhythm.  Murmur heard.  Pulmonary/Chest: Effort normal and breath sounds normal.  Abdominal: Soft. Bowel sounds are normal.  Musculoskeletal: He exhibits no edema.  RUE with diffuse ecchymosis at shoulder and biceps area. Right hip/thigh with evidence of hematoma and tenderness mid-thigh. Diffuse ecchymosis right flank to right thigh area. Pain at right scaphoid, with decreased grip. Right shoulder tender with AROM and PROM Neurological: He is alert.  [name] Awake and alert. better attention.. Able to state DOB but unable to state situation, place. Doesn't remember my name. Quiet voice with flat affect and delayed initiation and slow movements. Pain inhibition affecting movement of  RUE/RLE. Skin: Skin is warm and dry.  Bruising and pain noted over the right hand. He has decreased grip as a result. No change   Assessment/Plan: 1. Functional deficits secondary to traumatic brain injury which require 3+ hours per day of interdisciplinary therapy in a comprehensive inpatient rehab setting. Physiatrist is providing close team supervision and 24 hour management of active medical problems listed below. Physiatrist and rehab team continue to assess barriers to discharge/monitor patient progress toward functional and medical goals.    FIM: FIM - Bathing Bathing Steps Patient Completed: Chest;Right Arm;Left Arm;Abdomen;Front perineal area;Right upper leg;Left upper leg Bathing: 3: Mod-Patient completes 5-7 12f 10 parts or 50-74%  FIM - Upper Body Dressing/Undressing Upper body dressing/undressing steps patient completed: Thread/unthread right sleeve of pullover shirt/dresss;Thread/unthread left sleeve of pullover shirt/dress Upper body dressing/undressing: 2: Max-Patient completed 25-49% of tasks FIM - Lower Body Dressing/Undressing Lower body dressing/undressing: 2: Max-Patient completed 25-49% of tasks  FIM - Toileting Toileting: 1: Total-Patient completed zero steps, helper did all 3  FIM - Diplomatic Services operational officer Devices: Best boy Transfers: 3-To toilet/BSC: Mod A (lift or lower assist);3-From toilet/BSC: Mod A (lift or lower assist)  FIM - Bed/Chair Transfer Bed/Chair Transfer Assistive Devices: Bed rails;Walker Bed/Chair Transfer: 4: Supine > Sit: Min A (steadying Pt. > 75%/lift 1 leg);2: Bed > Chair or W/C: Max A (lift and lower assist)  FIM - Locomotion: Wheelchair Locomotion: Wheelchair: 1: Total Assistance/staff pushes wheelchair (Pt<25%) FIM - Locomotion: Ambulation Locomotion: Ambulation Assistive Devices: Designer, industrial/product Ambulation/Gait Assistance: 3: Mod assist Locomotion: Ambulation: 2: Travels 50 - 149 ft with  moderate assistance (Pt: 50 - 74%)  Comprehension Comprehension Mode: Auditory Comprehension: 3-Understands basic 50 - 74% of  the time/requires cueing 25 - 50%  of the time  Expression Expression Mode: Verbal Expression: 3-Expresses basic 50 - 74% of the time/requires cueing 25 - 50% of the time. Needs to repeat parts of sentences.  Social Interaction Social Interaction: 3-Interacts appropriately 50 - 74% of the time - May be physically or verbally inappropriate.  Problem Solving Problem Solving: 1-Solves basic less than 25% of the time - needs direction nearly all the time or does not effectively solve problems and may need a restraint for safety  Memory Memory: 2-Recognizes or recalls 25 - 49% of the time/requires cueing 51 - 75% of the time Medical Problem List and Plan:  1. DVT Prophylaxis/Anticoagulation: Mechanical: Sequential compression devices, below knee Bilateral lower extremities  2. Pain Management: denies any pain at current time. Wife reports arthritis "all over"  3. Mood: Monitor for now. Effexor resumed. LCSW to follow up for formal evaluation.  4. Neuropsych: This patient is not capable of making decisions on his/her own behalf.  5. CAD: Low dose Ace resumed.  6. Atrial Fibrillation: Off blood thinner due to ICH. Monitor HR with bid checks.  7. Chronic anemia:hgb back to 9.0 after transfusion. He his baseline hgb is around 9.0 8. OSA: Continue CPAP with one liter O2 whenever sleeping.  9. H/O Gastrectomy with gastroparesis: Small meals. Will add snacks to help with intake.  10. Lethargy:sleeping better, but slow to initiate. Not very anxious to start a stimulant here given his multiple medical issues. 11. BPH: continue Proscar daily 12. Right wrist: xray is supsicious for pseudogout, and ?scaphulolunate injury. No MRI due PPM. Manage conservatively  -voltaren gel  -monitor clinically. Doesn't appear to be having a big impact at present and his pain seems to be more  in the digits. 13. Wounds: local care  LOS (Days) 5 A FACE TO FACE EVALUATION WAS PERFORMED  SWARTZ,ZACHARY T 03/19/2012, 8:00 AM

## 2012-03-19 NOTE — Progress Notes (Signed)
Physical Therapy Note   Patient Details  Name: Eric [name] MRN: [id number] Date of Birth: [birth year] Today's Date: 03/19/2012  [id number] (55 minutes) individual Pain: no reported pain/ premedicated Focus of treatment: Gait training/endurance; therapeutic activities focused on standing tolerance; standing balance; focused attention to task Treatment: Gait 100 feet X 2 RW min assist for balance, direction finding, mod assist stepping backward with assist for balance, guiding AD and max vcs for hand placement sit >< stand; Standing X 4 for approximately 3-4 minute sessions with max assist to prevent posterior loss of balance (standing tolerance limited by fatigue)- performing basketball toss, matching card suites ( 25% accuracy), pipe tree matching picture ( required max vcs). Pt oriented to person , situation ( " I fell").   Marquesha Robideau,JIM 03/19/2012, 7:40 AM

## 2012-03-20 ENCOUNTER — Inpatient Hospital Stay (HOSPITAL_COMMUNITY): Payer: Medicare Other | Admitting: Occupational Therapy

## 2012-03-20 ENCOUNTER — Inpatient Hospital Stay (HOSPITAL_COMMUNITY): Payer: Medicare Other

## 2012-03-20 ENCOUNTER — Inpatient Hospital Stay (HOSPITAL_COMMUNITY): Payer: Medicare Other | Admitting: Physical Therapy

## 2012-03-20 ENCOUNTER — Inpatient Hospital Stay (HOSPITAL_COMMUNITY): Payer: Medicare Other | Admitting: Speech Pathology

## 2012-03-20 DIAGNOSIS — Z5189 Encounter for other specified aftercare: Secondary | ICD-10-CM

## 2012-03-20 DIAGNOSIS — S069X9A Unspecified intracranial injury with loss of consciousness of unspecified duration, initial encounter: Secondary | ICD-10-CM

## 2012-03-20 DIAGNOSIS — W19XXXA Unspecified fall, initial encounter: Secondary | ICD-10-CM

## 2012-03-20 LAB — BASIC METABOLIC PANEL
Chloride: 108 mEq/L (ref 96–112)
Creatinine, Ser: 0.84 mg/dL (ref 0.50–1.35)
GFR calc Af Amer: >90 mL/min (ref >90)
Potassium: 3.9 mEq/L (ref 3.5–5.1)

## 2012-03-20 NOTE — Progress Notes (Addendum)
Subjective/Complaints: Had a good night and day yesterday A 12 point review of systems has been performed and if not noted above is otherwise negative.   Objective: Vital Signs: Blood pressure 142/87, pulse 59, temperature 97.6 F (36.4 C), temperature source Oral, resp. rate 18, weight 73.9 kg (162 lb 14.7 oz), SpO2 98.00%. No results found. No results found for this basename: WBC:2,HGB:2,HCT:2,PLT:2 in the last 72 hours  Basename 03/20/12 0600  NA 141  K 3.9  CL 108  CO2 23  GLUCOSE 101*  BUN 28*  CREATININE 0.84  CALCIUM 8.8   CBG (last 3)  No results found for this basename: GLUCAP:3 in the last 72 hours  Wt Readings from Last 3 Encounters:  03/20/12 73.9 kg (162 lb 14.7 oz)  03/14/12 76 kg (167 lb 8.8 oz)  02/08/12 75.751 kg (167 lb)    Physical Exam:  Constitutional: He appears well-developed and well-nourished.  HENT:  Head: Normocephalic.  Diffuse ecchymosis on frontal scalp extending to right forehead and right cheek. Minimal left periorbital ecchymosis. Right eyebrow with resolving hematoma with scabs-some drainage. Eyes: Pupils are equal, round, and reactive to light.  Right scleral hemorrhage  Neck: Normal range of motion.  Cardiovascular: Normal rate and regular rhythm.  Murmur heard.  Pulmonary/Chest: Effort normal and breath sounds normal.  Abdominal: Soft. Bowel sounds are normal.  Musculoskeletal: He exhibits no edema.  RUE with diffuse ecchymosis at shoulder and biceps area. Right hip/thigh with evidence of hematoma and tenderness mid-thigh. Diffuse ecchymosis right flank to right thigh area. Pain at right scaphoid, with decreased grip. Right shoulder tender with AROM and PROM Neurological: He is alert.  [name] Awake and alert. better attention.. Able to state DOB but unable to state situation, place. Doesn't remember my name. Quiet voice with flat affect and delayed initiation and slow movements. Pain inhibition affecting movement of  RUE/RLE. Skin: Skin is warm and dry.  Bruising and pain noted over the right hand. He has decreased grip as a result. No change   Assessment/Plan: 1. Functional deficits secondary to traumatic brain injury which require 3+ hours per day of interdisciplinary therapy in a comprehensive inpatient rehab setting. Physiatrist is providing close team supervision and 24 hour management of active medical problems listed below. Physiatrist and rehab team continue to assess barriers to discharge/monitor patient progress toward functional and medical goals.  Still fatigues at times but showing gradual improvement  FIM: FIM - Bathing Bathing Steps Patient Completed: Chest;Right Arm;Left Arm;Abdomen;Front perineal area;Right upper leg;Left upper leg Bathing: 3: Mod-Patient completes 5-7 97f 10 parts or 50-74%  FIM - Upper Body Dressing/Undressing Upper body dressing/undressing steps patient completed: Thread/unthread right sleeve of pullover shirt/dresss;Thread/unthread left sleeve of pullover shirt/dress Upper body dressing/undressing: 2: Max-Patient completed 25-49% of tasks FIM - Lower Body Dressing/Undressing Lower body dressing/undressing: 2: Max-Patient completed 25-49% of tasks  FIM - Toileting Toileting: 1: Total-Patient completed zero steps, helper did all 3  FIM - Diplomatic Services operational officer Devices: Best boy Transfers: 3-To toilet/BSC: Mod A (lift or lower assist);3-From toilet/BSC: Mod A (lift or lower assist)  FIM - Bed/Chair Transfer Bed/Chair Transfer Assistive Devices: Bed rails;Walker Bed/Chair Transfer: 4: Supine > Sit: Min A (steadying Pt. > 75%/lift 1 leg);2: Bed > Chair or W/C: Max A (lift and lower assist)  FIM - Locomotion: Wheelchair Locomotion: Wheelchair: 1: Total Assistance/staff pushes wheelchair (Pt<25%) FIM - Locomotion: Ambulation Locomotion: Ambulation Assistive Devices: Designer, industrial/product Ambulation/Gait Assistance: 3: Mod  assist Locomotion: Ambulation:  2: Travels 50 - 149 ft with moderate assistance (Pt: 50 - 74%)  Comprehension Comprehension Mode: Auditory Comprehension: 3-Understands basic 50 - 74% of the time/requires cueing 25 - 50%  of the time  Expression Expression Mode: Verbal Expression: 3-Expresses basic 50 - 74% of the time/requires cueing 25 - 50% of the time. Needs to repeat parts of sentences.  Social Interaction Social Interaction: 3-Interacts appropriately 50 - 74% of the time - May be physically or verbally inappropriate.  Problem Solving Problem Solving: 1-Solves basic less than 25% of the time - needs direction nearly all the time or does not effectively solve problems and may need a restraint for safety  Memory Memory: 2-Recognizes or recalls 25 - 49% of the time/requires cueing 51 - 75% of the time Medical Problem List and Plan:  1. DVT Prophylaxis/Anticoagulation: Mechanical: Sequential compression devices, below knee Bilateral lower extremities  2. Pain Management: denies any pain at current time. Wife reports arthritis "all over"  3. Mood: Monitor for now. Effexor resumed. LCSW to follow up for formal evaluation.  4. Neuropsych: This patient is not capable of making decisions on his/her own behalf.  5. CAD: Low dose Ace resumed.  6. Atrial Fibrillation: Off blood thinner due to ICH. Monitor HR with bid checks.  7. Chronic anemia:hgb back to 9.0 after transfusion. He his baseline hgb is around 9.0 8. OSA: Continue CPAP with one liter O2 whenever sleeping.  9. H/O Gastrectomy with gastroparesis: Small meals. Will add snacks to help with intake.  10. Lethargy:sleeping better, stamina improving. Not very anxious to start a stimulant here given his multiple medical issues. 11. BPH: continue Proscar daily 12. Right wrist: xray is supsicious for pseudogout, and ?scaphulolunate injury. No MRI due PPM. Manage conservatively  -voltaren gel  -monitor clinically.  13. Wounds: local  care  -dc sutures over eye in a few days. 14. FEN- still slightly dry on labs, hold po lasix and push po  LOS (Days) 6 A FACE TO FACE EVALUATION WAS PERFORMED  SWARTZ,ZACHARY T 03/20/2012, 8:01 AM

## 2012-03-20 NOTE — Progress Notes (Signed)
Physical Therapy Note  Patient Details  Name: Eric [name] MRN: [id number] Date of Birth: Jul 01, 1925 Today's Date: 03/20/2012  Time: 1345-1442 57 minutes  No c/o pain.  Bed mobility with supervision including rolling and scooting up/down and laterally in bed.  Gait in household situation with min A, cuing for problem solving obstacle negotiation.  Supine stretching B adductors and hamstrings to decrease back pain and improve posture.  Sit to stand repetitions with focus on anterior wt shifts, standing from lower surfaces.  Pt able to stand from 20.5'' with supervision at end of session.  Standing sorting activity with 4 suits of cars, pt initially needed verbal and gesturing cues, able to decrease to just gesturing cues with repetition.  Pt able to stand 60 seconds before requiring rest.  Nu step for activity tolerance and attention to task, pt able to attend and pedal nustep x 2 minutes before requiring cues, pt able to pedal for 8 mins total at level 4.  Individual therapy  Ren Aspinall 03/20/2012, 2:43 PM

## 2012-03-20 NOTE — Progress Notes (Signed)
Speech Language Pathology Daily Session Note  Patient Details  Name: OZIEL BULLA MRN: [id number] Date of Birth: August 21, 1925  Today's Date: 03/20/2012 Time: 0800-0900 Time Calculation (min): 60 min  Short Term Goals: Week 1: SLP Short Term Goal 1 (Week 1): Pt will orient to place, time and situation with Max A multimodal cueing.  SLP Short Term Goal 2 (Week 1): Pt will sustain attention to a functional task for ~2 minutes with Max A multimodal cueing. SLP Short Term Goal 3 (Week 1): Pt will utilize call bell to express wants/needs with Max A mutlimodal cueing.  SLP Short Term Goal 4 (Week 1): Pt will identify 1 physical and 1 cognitive deficit with Max A multimodal cueing SLP Short Term Goal 5 (Week 1): Pt will utilize swallowing compensatory strategies of slow rate and small bites with max verbal cues.  SLP Short Term Goal 6 (Week 1): Pt will self-monitor and correct oral residue with max verbal and question cues.   Skilled Therapeutic Interventions: Treatment focus on dysphagia and cognitive goals. Pt consumed regular textures and demonstrated efficient mastication with minimal oral residue that cleared with spontaneous liquid washes.  Pt required Min verbal and visual cues for initiation of meal and for selective attention to task. Pt also required Max A verbal and question cues for functional problem solving with tray set-up which was impacted by impaired motor planning. Pt independently oriented to situation but required Max semantic and question cues to orient to time and place. Pt also demonstrated an increase in social interaction and initiated verbal conversation with clinician.    FIM:  Comprehension Comprehension Mode: Auditory Comprehension: 3-Understands basic 50 - 74% of the time/requires cueing 25 - 50%  of the time Expression Expression Mode: Verbal Expression: 3-Expresses basic 50 - 74% of the time/requires cueing 25 - 50% of the time. Needs to repeat parts of  sentences. Social Interaction Social Interaction: 3-Interacts appropriately 50 - 74% of the time - May be physically or verbally inappropriate. Problem Solving Problem Solving: 2-Solves basic 25 - 49% of the time - needs direction more than half the time to initiate, plan or complete simple activities Memory Memory: 2-Recognizes or recalls 25 - 49% of the time/requires cueing 51 - 75% of the time FIM - Eating Eating Activity: 5: Supervision/cues  Pain Pain Assessment Pain Assessment: No/denies pain  Therapy/Group: Individual Therapy  Jailyn Langhorst 03/20/2012, 9:10 AM

## 2012-03-20 NOTE — Progress Notes (Signed)
Recreational Therapy Assessment and Plan  Patient Details  Name: Eric [name] MRN: [id number] Date of Birth: 1926-01-03 Today's Date: 03/20/2012  Rehab Potential: Good ELOS: 2 weeks   AssessmentProblem List:  Patient Active Problem List   Diagnosis   .  HYPERLIPIDEMIA   .  OBSTRUCTIVE SLEEP APNEA   .  HYPERTENSION   .  CAD, AUTOLOGOUS BYPASS GRAFT   .  MOBITZ II ATRIOVENTRICULAR BLOCK   .  RBBB W/ LAFB   .  Atrial fibrillation   .  CEREBROVASCULAR DISEASE   .  ABDOMINAL AORTIC ANEURYSM   .  DEGENERATIVE JOINT DISEASE   .  TRANSIENT ISCHEMIC ATTACKS, HX OF   .  BENIGN PROSTATIC HYPERTROPHY, HX OF   .  PACEMAKER, PERMANENT   .  Long term (current) use of anticoagulants   .  Pulmonary nodule   .  Gastric stasis with food retention   .  Recurrent small bowel obstructions   .  Chronic diastolic heart failure   .  OSA (obstructive sleep apnea)   .  Iron deficiency anemia, unspecified   .  Gastritis   .  Internal hemorrhoids   .  ICH (intracerebral hemorrhage)   .  Hematoma of frontal scalp   .  Pleural effusion on right    Past Medical History:  Past Medical History   Diagnosis  Date   .  BPH (benign prostatic hypertrophy)    .  DJD (degenerative joint disease)    .  Hx of transient ischemic attack (TIA)    .  AAA (abdominal aortic aneurysm)    .  Cerebrovascular disease    .  HLD (hyperlipidemia)    .  HTN (hypertension)    .  Second degree AV block, Mobitz type II      s/p PPM   .  CAD (coronary artery disease)    .  Persistent atrial fibrillation      on pradaxa   .  Sleep apnea      cpap machine   .  Gastric ulcer, acute  02/2011     multiple - NSAID/Pradaxa thought causative. associated gastroparesis-like problems   .  Small bowel obstruction      multiple over the years   .  Anemia    .  Anemia     Past Surgical History:  Past Surgical History   Procedure  Date   .  Pacemaker insertion  01/24/10     Adapta Medtronic   .  Coronary artery bypass graft     .  Appendectomy    .  Gastrectomy    .  Knee arthroscopy      right, with medial and lateral meniscal   .  Finger surgery      Left middle finger dorsal lesion   .  Upper gastrointestinal endoscopy  03/12/2011     stomach ulcer, esophageal erosion   .  Esophagogastroduodenoscopy  01/13/2012     Procedure: ESOPHAGOGASTRODUODENOSCOPY (EGD); Surgeon: Iva Boop, MD; Location: [facility]; Service: Endoscopy; Laterality: N/A;   .  Flexible sigmoidoscopy  01/13/2012     Procedure: FLEXIBLE SIGMOIDOSCOPY; Surgeon: Iva Boop, MD; Location: [facility]; Service: Endoscopy; Laterality: N/A; Fleets enema on arrival    Assessment & Plan  Clinical Impression: Patient is a 78 y.o. year old male with recent admission to the hospital on sustained a ground level fall and struck his head against the curb on 03/08/12. Taken to [facility] where [employer]  done revealed R-frontal ICH. He was transferred to [facility] and evaluated by Dr. Wynetta Emery who recommended monitoring MS and serial CCT. MS has been stable. Last CT with stable right frontal hemorrhagic contusions. Right shoulder CT with suspicion of large retracted RTC tear with advanced degenerative changes as well as incidental note of large R-pleural effusion and small pulmonary nodules. Pulmonary feels patient with recurrent effusion likely due to heart failure. Patient transferred to [facility] on 03/14/2012 .   Patient presents with decreased activity tolerance, decreased functional mobility, decreased balance, decreased initiation, decreased attention, decreased safety/awareness, decreased memory and delayed processing limiting pt's independence with leisure/community pursuits. Leisure History/Participation Premorbid leisure interest/current participation: Sports - Basketball;Sports - Exercise (Comment);Sports - Other (Comment);Nature - Vegetable gardening;Community - Travel (Comment) (football, cardiac rehab, trainer 2-3 x's week) Expression Interests: Music (Comment) Other  Leisure Interests: Television;Reading (stock market) Leisure Participation Style: Alone;With Family/Friends Awareness of Community Resources: Good-identify 3 post discharge leisure resources Psychosocial / Spiritual Spiritual Interests: Church Does patient have pets?: No Social interaction - Mood/Behavior: Cooperative Film/video editor for Education?: Yes Recreational Therapy Orientation Orientation -Reviewed with patient: Available activity resources Strengths/Weaknesses Patient Strengths/Abilities: Willingness to participate;Active premorbidly Patient weaknesses: Physical limitations  Plan Rec Therapy Plan Is patient appropriate for Therapeutic Recreation?: Yes Rehab Potential: Good Treatment times per week: Min 1 time per week >20 minutes Estimated Length of Stay: 2 weeks TR Treatment/Interventions: Adaptive equipment instruction;1:1 session;Balance/vestibular training;Cognitive remediation/compensation;Community reintegration;Functional mobility training;Patient/family education;Recreation/leisure participation;Therapeutic exercise  Recommendations for other services: None  Discharge Criteria: Patient will be discharged from TR if patient refuses treatment 3 consecutive times without medical reason.  If treatment goals not met, if there is a change in medical status, if patient makes no progress towards goals or if patient is discharged from hospital.  The above assessment, treatment plan, treatment alternatives and goals were discussed and mutually agreed upon: by patient  [name] 03/20/2012, 11:49 AM

## 2012-03-20 NOTE — Progress Notes (Signed)
Occupational Therapy Session Note  Patient Details  Name: Eric [name] MRN: [id number] Date of Birth: [birth year]  Today's Date: 03/20/2012 Time: 5621-3086 Time Calculation (min): 45 min  Short Term Goals: Week 1:  OT Short Term Goal 1 (Week 1): Pt would bathe at shower level with mod cues for initiation OT Short Term Goal 2 (Week 1): Pt will perform UB dressing Mod A. OT Short Term Goal 3 (Week 1): Pt will perform sit <> stands Min A for clothing management. OT Short Term Goal 4 (Week 1): Pt will perform 2 of 3 tasks during toileting. OT Short Term Goal 5 (Week 1): Pt will maintain standing balance with Min A during functional activity for 2 min  Skilled Therapeutic Interventions/Progress Updates:    1:1 Focus on muliple sit to stands, standing balance with min to total A to bounce pass a ball for automatic reactions, initiation, and focused to sustained attention. Also performed functional ambulation with RW down the hallway gathering horseshoes hanging on the wall rail needing max A to attend to task and min A with tactile cues on hips or with RW for initiation. Hip extension stretch laying supine in supported position on wedge with feet on the floor.   Therapy Documentation Precautions:  Precautions Precautions: Fall Restrictions Weight Bearing Restrictions: No Pain: Pain Assessment Pain Assessment: No/denies pain  See FIM for current functional status  Therapy/Group: Individual Therapy  Roney Mans [facility] 03/20/2012, 2:14 PM

## 2012-03-20 NOTE — Progress Notes (Signed)
Occupational Therapy Session Note  Patient Details  Name: Eric [name] MRN: [id number] Date of Birth: 1926/01/03  Today's Date: 03/20/2012 Time: 1000-1100 Time Calculation (min): 60 min  Short Term Goals: Week 1:  OT Short Term Goal 1 (Week 1): Pt would bathe at shower level with mod cues for initiation OT Short Term Goal 2 (Week 1): Pt will perform UB dressing Mod A. OT Short Term Goal 3 (Week 1): Pt will perform sit <> stands Min A for clothing management. OT Short Term Goal 4 (Week 1): Pt will perform 2 of 3 tasks during toileting. OT Short Term Goal 5 (Week 1): Pt will maintain standing balance with Min A during functional activity for 2 min  Skilled Therapeutic Interventions/Progress Updates:    Pt's wife present to observe session.  Pt amb with RW to bathroom for bathing at shower level.  Pt required mod A with max verbal cues for initiation during ambulation.  Pt required max verbal cues for initiation and sequencing during bathing tasks.  Pt initiated dressing tasks when presented with clothing items but required max verbal cues for sequencing.  Pt not oriented to place, situation, or date throughout session.  Pt required assistance with pulling shirt over head and over trunk.  Pt's wife stated she was assisting prior to hospitalization secondary to limited bilateral shoulder AROM.  Focus on task initiation, sequencing, and sustained attention.   Therapy Documentation Precautions:  Precautions Precautions: Fall Restrictions Weight Bearing Restrictions: No General:   Vital Signs:   Pain: Pain Assessment Pain Assessment: No/denies pain  See FIM for current functional status  Therapy/Group: Individual Therapy  Rich Brave 03/20/2012, 11:13 AM
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
## 2012-03-21 ENCOUNTER — Inpatient Hospital Stay (HOSPITAL_COMMUNITY): Payer: Medicare Other

## 2012-03-21 ENCOUNTER — Inpatient Hospital Stay (HOSPITAL_COMMUNITY): Payer: Medicare Other | Admitting: Speech Pathology

## 2012-03-21 ENCOUNTER — Encounter (HOSPITAL_COMMUNITY): Payer: Self-pay | Admitting: Radiology

## 2012-03-21 ENCOUNTER — Inpatient Hospital Stay (HOSPITAL_COMMUNITY): Payer: Medicare Other | Admitting: Occupational Therapy

## 2012-03-21 ENCOUNTER — Inpatient Hospital Stay (HOSPITAL_COMMUNITY): Payer: Medicare Other | Admitting: *Deleted

## 2012-03-21 ENCOUNTER — Encounter (HOSPITAL_COMMUNITY): Payer: Medicare Other | Admitting: Occupational Therapy

## 2012-03-21 ENCOUNTER — Inpatient Hospital Stay (HOSPITAL_COMMUNITY): Payer: Medicare Other | Admitting: Physical Therapy

## 2012-03-21 DIAGNOSIS — Z5189 Encounter for other specified aftercare: Secondary | ICD-10-CM

## 2012-03-21 DIAGNOSIS — W19XXXA Unspecified fall, initial encounter: Secondary | ICD-10-CM

## 2012-03-21 DIAGNOSIS — S069X9A Unspecified intracranial injury with loss of consciousness of unspecified duration, initial encounter: Secondary | ICD-10-CM

## 2012-03-21 LAB — CBC
HCT: 34.5 % — ABNORMAL LOW (ref 39.0–52.0)
Hemoglobin: 10.2 g/dL — ABNORMAL LOW (ref 13.0–17.0)
RDW: 19.8 % — ABNORMAL HIGH (ref 11.5–15.5)
WBC: 8.1 10*3/uL (ref 4.0–10.5)

## 2012-03-21 LAB — URINALYSIS, ROUTINE W REFLEX MICROSCOPIC
Bilirubin Urine: NEGATIVE
Leukocytes, UA: NEGATIVE
Nitrite: NEGATIVE
Specific Gravity, Urine: 1.021 (ref 1.005–1.030)
pH: 5.5 (ref 5.0–8.0)

## 2012-03-21 LAB — BASIC METABOLIC PANEL
BUN: 28 mg/dL — ABNORMAL HIGH (ref 6–23)
Chloride: 107 mEq/L (ref 96–112)
GFR calc Af Amer: 85 mL/min — ABNORMAL LOW (ref >90)
Potassium: 4.4 mEq/L (ref 3.5–5.1)

## 2012-03-21 NOTE — Progress Notes (Signed)
Speech Language Pathology Daily Session Note  Patient Details  Name: Eric [name] MRN: [id number] Date of Birth: 1926-04-17  Today's Date: 03/21/2012 Time: 0800-0900 Time Calculation (min): 60 min  Short Term Goals: Week 1: SLP Short Term Goal 1 (Week 1): Pt will orient to place, time and situation with Max A multimodal cueing.  SLP Short Term Goal 2 (Week 1): Pt will sustain attention to a functional task for ~2 minutes with Max A multimodal cueing. SLP Short Term Goal 3 (Week 1): Pt will utilize call bell to express wants/needs with Max A mutlimodal cueing.  SLP Short Term Goal 4 (Week 1): Pt will identify 1 physical and 1 cognitive deficit with Max A multimodal cueing SLP Short Term Goal 5 (Week 1): Pt will utilize swallowing compensatory strategies of slow rate and small bites with max verbal cues.  SLP Short Term Goal 6 (Week 1): Pt will self-monitor and correct oral residue with max verbal and question cues.   Skilled Therapeutic Interventions: Treatment focus on dysphagia and cognitive goals. Pt consumed regular textures and demonstrated efficient mastication with minimal oral residue that cleared with spontaneous liquid washes but required Min verbal cues to utilize small bites. Pt required Mod verbal and visual cues for initiation of meal and for selective attention to task. Pt also required Max A verbal and question cues for functional problem solving with tray set-up which was impacted by overall confusion and attention. Pt also required total A for orientation to situation, time and place. Pt's overall cognitive function impacted by fatigue.    FIM:  Comprehension Comprehension Mode: Auditory Comprehension: 2-Understands basic 25 - 49% of the time/requires cueing 51 - 75% of the time Expression Expression Mode: Verbal Expression: 2-Expresses basic 25 - 49% of the time/requires cueing 50 - 75% of the time. Uses single words/gestures. Social Interaction Social Interaction:  2-Interacts appropriately 25 - 49% of time - Needs frequent redirection. Problem Solving Problem Solving: 1-Solves basic less than 25% of the time - needs direction nearly all the time or does not effectively solve problems and may need a restraint for safety Memory Memory: 1-Recognizes or recalls less than 25% of the time/requires cueing greater than 75% of the time FIM - Eating Eating Activity: 5: Set-up assist for open containers  Pain Pain Assessment Pain Assessment: No/denies pain  Therapy/Group: Individual Therapy  Pier Bosher 03/21/2012, 3:25 PM

## 2012-03-21 NOTE — Progress Notes (Signed)
Recreational Therapy Session Note  Patient Details  Name: Eric [name] MRN: [id number] Date of Birth: [birth year] Today's Date: 03/21/2012 Time:  1015-1108 Pain: no c/o Skilled Therapeutic Interventions/Progress Updates: attempted standing activity to shoot basketball, but pt unable to tolerate standing due to fatigue.  Pt required max assist for balance.  Transitioned to seated basketball activity shooting with focus on initiation of task, forward weight shifts, and following simple commands with supervision, gestures and mod instructional cues.  Pt sat EOC to work on pipe tree for problem solving, pt required max-total assist/cues.  Therapy/Group: Co-Treatment  Everardo Voris 03/21/2012, 11:07 AM

## 2012-03-21 NOTE — Progress Notes (Signed)
Occupational Therapy Session Note  Patient Details  Name: Eric [name] MRN: [id number] Date of Birth: [birth year]  Today's Date: 03/21/2012 Time: 1191-4782 Time Calculation (min): 58 min  Short Term Goals: Week 1:  OT Short Term Goal 1 (Week 1): Pt would bathe at shower level with mod cues for initiation OT Short Term Goal 2 (Week 1): Pt will perform UB dressing Mod A. OT Short Term Goal 3 (Week 1): Pt will perform sit <> stands Min A for clothing management. OT Short Term Goal 4 (Week 1): Pt will perform 2 of 3 tasks during toileting. OT Short Term Goal 5 (Week 1): Pt will maintain standing balance with Min A during functional activity for 2 min  Skilled Therapeutic Interventions/Progress Updates:    1:1 self care retraining at shower level including bathing, dressing , grooming. Focus on functional ambulation with RW with upright posture, initiation, focused to sustained attention with mod A, sequencing and task organization. Pt with improved initiation with familiar basic ADL tasks. Orientation with "orientation station" on the wall (with clock, calendar, name of place and situation) with total - difficulty with comprehension and internalizing information. Pt able to don and tie both shoes with foot stool and extra time. Pt stood at the sink to brush teeth with mod A to maintain upright posture. The longer he stood, his knees bending - difficulty with right hip and knee full extension.  Therapy Documentation Precautions:  Precautions Precautions: Fall Restrictions Weight Bearing Restrictions: No Pain: No c/ pain See FIM for current functional status  Therapy/Group: Individual Therapy  Roney Mans [facility] 03/21/2012, 10:42 AM

## 2012-03-21 NOTE — Progress Notes (Signed)
Occupational Therapy Session Note  Patient Details  Name: Eric [name] MRN: [id number] Date of Birth: Dec 28, 1925  Today's Date: 03/21/2012 Time: 4098-1191 Time Calculation (min): 30 min  Short Term Goals: Week 1:  OT Short Term Goal 1 (Week 1): Pt would bathe at shower level with mod cues for initiation OT Short Term Goal 2 (Week 1): Pt will perform UB dressing Mod A. OT Short Term Goal 3 (Week 1): Pt will perform sit <> stands Min A for clothing management. OT Short Term Goal 4 (Week 1): Pt will perform 2 of 3 tasks during toileting. OT Short Term Goal 5 (Week 1): Pt will maintain standing balance with Min A during functional activity for 2 min  Skilled Therapeutic Interventions/Progress Updates:    Pt difficult to arouse from nap. Pt slept from 11am to 2:15pm and difficulty to arouse; total A to come to EOB and transition into standing and mod A transfer with RW into recliner. Engaged in eating lunch with max encouragement for drinking fluids. Pt with little talking, oriented only to self. PA came into address and assess pt.  Therapy Documentation Precautions:  Precautions Precautions: Fall Restrictions Weight Bearing Restrictions: No Pain:  no c/o pain  See FIM for current functional status  Therapy/Group: Individual Therapy  Roney Mans [facility] 03/21/2012, 2:52 PM

## 2012-03-21 NOTE — Progress Notes (Signed)
Physical Therapy Note  Patient Details  Name: Eric [name] MRN: [id number] Date of Birth: August 10, 1925 Today's Date: 03/21/2012  Time: 1015-1109 54 minutes Co tx with TR  No c/o pain.  Pt demo'd increased fatigue throughout session limiting activity tolerance and attention.  Basketball toss attempted standing, pt unable to maintain standing and hold ball with max A due to pt falling posteriorly, unable to self correct.  Seated basketball toss with focus on pt initiating forward wt shifts, pt able to perform with repetition, required gesture and verbal cues.  Standing pipe tree activity initially with mod cuing for problem solving, then required max-total A for pipe tree activity seated and standing when fatigued.  Gait training short distances with min A.  Frequent cues to attend to task in min-mod distracting environment, assist to advance R LE when fatigued.  Transfers with mod A without device today as pt demo's decreased strength and endurance throughout session.  Individual therapy  Eric [name] 03/21/2012, 11:15 AM

## 2012-03-21 NOTE — Progress Notes (Signed)
Patient sleeping between therapies CPAP on . Attempted to awaken patient to eat and participate in therapies . Patient difficult to arouse. Cool cloth applied to face and patient arousable but falls asleep quickly. Blood pressure 165/94 pulse 59 o2 sat at 97% on room air. Wife concerned , P. Love , PA notified. Emotional support given to  Patient's wife . Continue with plan of care .                                                   Cleotilde Neer

## 2012-03-21 NOTE — Progress Notes (Signed)
Physical Therapy Session Note  Patient Details  Name: Eric [name] MRN: [id number] Date of Birth: [birth year]  Today's Date: 03/21/2012 Time: 1478-2956 Time Calculation (min): 48 min  Short Term Goals: Week 1:  PT Short Term Goal 1 (Week 1): Pt will perform functional transfers with min A PT Short Term Goal 1 - Progress (Week 1): Progressing toward goal PT Short Term Goal 2 (Week 1): Pt will demo selective attention to functional task x 1 minute with min A PT Short Term Goal 2 - Progress (Week 1): Progressing toward goal PT Short Term Goal 3 (Week 1): Pt will gait in controlled environment with min A 50' PT Short Term Goal 3 - Progress (Week 1): Progressing toward goal  Skilled Therapeutic Interventions/Progress Updates:    Pt initially doing well with initiation requiring min/mod assist cues for initiation, min assist to stand however initially impaired by posterior weight shift. Able to shift anteriorly with gait initiation. Ambulation x 45' with RW, min assist and mod/max directional cues. Pt requires assist to direct RW. [facility] finding task in gym max/total assist for attention to task and remembering task. Pt attempting to sit prior to turning to mat, pt able to verbalize that he would have "sat in the floor" had he continued to try to sit. Attempted to perform steps, pt initially required min assist however at top of steps got "stuck", was not following motor commands and required +3 total assist to descend steps safely.   Therapy Documentation Precautions:  Precautions Precautions: Fall Restrictions Weight Bearing Restrictions: No Pain: Pain Assessment Pain Assessment: No/denies pain PAINAD (Pain Assessment in Advanced Dementia) Breathing: normal Negative Vocalization: none Facial Expression: smiling or inexpressive Body Language: relaxed Consolability: no need to console PAINAD Score: 0   See FIM for current functional status  Therapy/Group: Individual  Therapy  Wilhemina Bonito 03/21/2012, 4:23 PM

## 2012-03-21 NOTE — Progress Notes (Signed)
Physical Therapy Note  Patient Details  Name: Eric [name] MRN: [id number] Date of Birth: Sep 14, 1925 Today's Date: 03/21/2012  Time In:  13:35  Time Out:  14:10.  Individual session.  No complaints of pain.  Wife present.  Treatment focused on basic orientation (patient scored an 18/100 on the GOAT), focused and very limited sustained attention, following one step commands consistently, basic initiation, patient and family education, functional ambulation with RW, basic safety, increasing activity tolerance.  Wife clearly and appropriately sad regarding functional changes in husband - education and support provided.   [name] 03/21/2012, 3:19 PM

## 2012-03-21 NOTE — Progress Notes (Signed)
Pt. Currently on home CPAP.

## 2012-03-21 NOTE — Progress Notes (Signed)
Pt seen, already wearing his home cpap machine and appears to be tolerating well at this time.

## 2012-03-21 NOTE — Progress Notes (Signed)
Subjective/Complaints: Had a good night and day yesterday. Just waking up today A 12 point review of systems has been performed and if not noted above is otherwise negative.   Objective: Vital Signs: Blood pressure 149/82, pulse 60, temperature 97.5 F (36.4 C), temperature source Oral, resp. rate 19, weight 73.9 kg (162 lb 14.7 oz), SpO2 97.00%. No results found. No results found for this basename: WBC:2,HGB:2,HCT:2,PLT:2 in the last 72 hours  Basename 03/20/12 0600  NA 141  K 3.9  CL 108  CO2 23  GLUCOSE 101*  BUN 28*  CREATININE 0.84  CALCIUM 8.8   CBG (last 3)  No results found for this basename: GLUCAP:3 in the last 72 hours  Wt Readings from Last 3 Encounters:  03/20/12 73.9 kg (162 lb 14.7 oz)  03/14/12 76 kg (167 lb 8.8 oz)  02/08/12 75.751 kg (167 lb)    Physical Exam:  Constitutional: He appears well-developed and well-nourished.  HENT:  Head: Normocephalic.  Diffuse ecchymosis on frontal scalp extending to right forehead and right cheek. Minimal left periorbital ecchymosis. Right eyebrow with resolving hematoma with scabs-some drainage. Eyes: Pupils are equal, round, and reactive to light.  Right scleral hemorrhage  Neck: Normal range of motion.  Cardiovascular: Normal rate and regular rhythm.  Murmur heard.  Pulmonary/Chest: Effort normal and breath sounds normal.  Abdominal: Soft. Bowel sounds are normal.  Musculoskeletal: He exhibits no edema.  RUE with diffuse ecchymosis at shoulder and biceps area. Right hip/thigh with evidence of hematoma and tenderness mid-thigh. Diffuse ecchymosis right flank to right thigh area. Pain at right scaphoid, with decreased grip. Right shoulder tender with AROM and PROM Neurological: He is alert.  [name] Awake and alert. better attention..  [name] [name]  Skin: Skin is warm and dry.  Bruising and pain noted over the right  hand. He has decreased grip as a result. No change   Assessment/Plan: 1. Functional deficits secondary to traumatic brain injury which require 3+ hours per day of interdisciplinary therapy in a comprehensive inpatient rehab setting. Physiatrist is providing close team supervision and 24 hour management of active medical problems listed below. Physiatrist and rehab team continue to assess barriers to discharge/monitor patient progress toward functional and medical goals.     FIM: FIM - Bathing Bathing Steps Patient Completed: Chest;Right Arm;Left Arm;Abdomen;Front perineal area;Right upper leg;Left upper leg Bathing: 3: Mod-Patient completes 5-7 5f 10 parts or 50-74%  FIM - Upper Body Dressing/Undressing Upper body dressing/undressing steps patient completed: Thread/unthread right sleeve of pullover shirt/dresss;Thread/unthread left sleeve of pullover shirt/dress Upper body dressing/undressing: 3: Mod-Patient completed 50-74% of tasks FIM - Lower Body Dressing/Undressing Lower body dressing/undressing steps patient completed: Don/Doff right sock;Fasten/unfasten right shoe;Fasten/unfasten left shoe;Don/Doff right shoe Lower body dressing/undressing: 2: Max-Patient completed 25-49% of tasks  FIM - Toileting Toileting: 1: Total-Patient completed zero steps, helper did all 3  FIM - Diplomatic Services operational officer Devices: Best boy Transfers: 3-To toilet/BSC: Mod A (lift or lower assist);3-From toilet/BSC: Mod A (lift or lower assist)  FIM - Bed/Chair Transfer Bed/Chair Transfer Assistive Devices: Bed rails;Walker Bed/Chair Transfer: 4: Chair or W/C > Bed: Min A (steadying Pt. > 75%);4: Bed > Chair or W/C: Min A (steadying Pt. > 75%);5: Sit > Supine: Supervision (verbal cues/safety issues);5: Supine > Sit: Supervision (verbal cues/safety issues)  FIM - Locomotion: Wheelchair Locomotion: Wheelchair: 1: Total Assistance/staff pushes wheelchair (Pt<25%) FIM -  Locomotion: Ambulation Locomotion:  Ambulation Assistive Devices: Walker - Rolling Ambulation/Gait Assistance: 3: Mod assist Locomotion: Ambulation: 2: Travels 50 - 149 ft with minimal assistance (Pt.>75%)  Comprehension Comprehension Mode: Auditory Comprehension: 3-Understands basic 50 - 74% of the time/requires cueing 25 - 50%  of the time  Expression Expression Mode: Verbal Expression: 3-Expresses basic 50 - 74% of the time/requires cueing 25 - 50% of the time. Needs to repeat parts of sentences.  Social Interaction Social Interaction: 3-Interacts appropriately 50 - 74% of the time - May be physically or verbally inappropriate.  Problem Solving Problem Solving: 2-Solves basic 25 - 49% of the time - needs direction more than half the time to initiate, plan or complete simple activities  Memory Memory: 2-Recognizes or recalls 25 - 49% of the time/requires cueing 51 - 75% of the time Medical Problem List and Plan:  1. DVT Prophylaxis/Anticoagulation: Mechanical: Sequential compression devices, below knee Bilateral lower extremities  2. Pain Management: denies any pain at current time. Wife reports arthritis "all over"  3. Mood: Monitor for now. Effexor resumed. LCSW to follow up for formal evaluation.  4. Neuropsych: This patient is not capable of making decisions on his/her own behalf.  5. CAD: Low dose Ace resumed.  6. Atrial Fibrillation: Off blood thinner due to ICH. Monitor HR with bid checks.  7. Chronic anemia:hgb back to 9.0 after transfusion. He his baseline hgb is around 9.0 8. OSA: Continue CPAP with one liter O2 whenever sleeping.  9. H/O Gastrectomy with gastroparesis: Small meals. Will add snacks to help with intake.  10. Lethargy:sleeping better, stamina improving. Not very anxious to start a stimulant here given his multiple medical issues. 11. BPH: continue Proscar daily 12. Right wrist: xray is supsicious for pseudogout, and ?scaphulolunate injury. No MRI due PPM.  Manage conservatively  -voltaren gel  -monitor clinically. --seems to be a bit better 13. Wounds: local care  -dc sutures over eye in a few days. 14. FEN- still slightly dry on labs, hold po lasix and push po- recheck Thursday   LOS (Days) 7 A FACE TO FACE EVALUATION WAS PERFORMED  Eric [name] 03/21/2012, 7:08 AM

## 2012-03-22 ENCOUNTER — Inpatient Hospital Stay (HOSPITAL_COMMUNITY): Payer: Medicare Other | Admitting: Occupational Therapy

## 2012-03-22 ENCOUNTER — Inpatient Hospital Stay (HOSPITAL_COMMUNITY): Payer: Medicare Other | Admitting: Physical Therapy

## 2012-03-22 ENCOUNTER — Inpatient Hospital Stay (HOSPITAL_COMMUNITY): Payer: Medicare Other

## 2012-03-22 ENCOUNTER — Inpatient Hospital Stay (HOSPITAL_COMMUNITY): Payer: Medicare Other | Admitting: Speech Pathology

## 2012-03-22 ENCOUNTER — Inpatient Hospital Stay (HOSPITAL_COMMUNITY): Payer: Medicare Other | Admitting: *Deleted

## 2012-03-22 LAB — URINE CULTURE

## 2012-03-22 MED ORDER — ADULT MULTIVITAMIN W/MINERALS CH
1.0000 | ORAL_TABLET | Freq: Every day | ORAL | Status: DC
  Administered 2012-03-22 – 2012-04-03 (×13): 1 via ORAL
  Filled 2012-03-22 (×14): qty 1

## 2012-03-22 MED ORDER — ENSURE PUDDING PO PUDG
1.0000 | Freq: Three times a day (TID) | ORAL | Status: DC
  Administered 2012-03-22 – 2012-04-02 (×12): 1 via ORAL

## 2012-03-22 NOTE — Progress Notes (Signed)
Subjective/Complaints: Slow to arouse yesterday but perked up later in day.  A 12 point review of systems has been performed and if not noted above is otherwise negative.   Objective: Vital Signs: Blood pressure 146/92, pulse 55, temperature 97.7 F (36.5 C), temperature source Oral, resp. rate 19, weight 75.9 kg (167 lb 5.3 oz), SpO2 99.00%. Ct Head Wo Contrast  03/21/2012  *RADIOLOGY REPORT*  Clinical Data: Mental status  CT HEAD WITHOUT CONTRAST  Technique:  Contiguous axial images were obtained from the base of the skull through the vertex without contrast.  Comparison: CT 03/10/2012  Findings: High density hemorrhage in the right frontal lobe is slightly smaller, now measuring 25 x 17 mm.  There is surrounding edema.  Mild local mass effect is present without midline shift. There is improving scalp hematoma lateral to the orbit on the right.  No new area of hemorrhage.  Moderate atrophy and moderate to advanced chronic microvascular ischemic changes.  No acute ischemic infarct.  Extensive atherosclerotic calcification in the circle of Willis.  IMPRESSION: Improving right frontal hematoma compared with the prior study.  Atrophy and advanced chronic microvascular ischemia.  No acute infarct.   Original Report Authenticated By: Camelia Phenes, M.D.     Basename 03/21/12 1759  WBC 8.1  HGB 10.2*  HCT 34.5*  PLT 183    Basename 03/21/12 1759 03/20/12 0600  NA 137 141  K 4.4 3.9  CL 107 108  CO2 21 23  GLUCOSE 164* 101*  BUN 28* 28*  CREATININE 0.97 0.84  CALCIUM 8.9 8.8   CBG (last 3)  No results found for this basename: GLUCAP:3 in the last 72 hours  Wt Readings from Last 3 Encounters:  03/22/12 75.9 kg (167 lb 5.3 oz)  03/14/12 76 kg (167 lb 8.8 oz)  02/08/12 75.751 kg (167 lb)    Physical Exam:  Constitutional: He appears well-developed and well-nourished.  HENT:  Head: Normocephalic.  Resolving bruises and hematomas over right face and eye Eyes: Pupils are equal,  round, and reactive to light.  Right scleral hemorrhage  Neck: Normal range of motion.  Cardiovascular: Normal rate and regular rhythm.  Murmur heard.  Pulmonary/Chest: Effort normal and breath sounds normal.  Abdominal: Soft. Bowel sounds are normal.  Musculoskeletal: He exhibits no edema.  RUE with diffuse ecchymosis at shoulder and biceps area. Right hip/thigh with evidence of hematoma and tenderness mid-thigh. Diffuse ecchymosis right flank to right thigh area. Pain at right scaphoid, with decreased grip. Right shoulder tender with AROM and PROM Neurological: He is alert.  [name] Awake and alert. better attention..  [name] Limited insight. poor day to day memory. Still doesn't remember my name.  Skin: Skin is warm and dry.  Bruising and pain noted over the right hand. He has decreased grip as a result. No change   Assessment/Plan: 1. Functional deficits secondary to traumatic brain injury which require 3+ hours per day of interdisciplinary therapy in a comprehensive inpatient rehab setting. Physiatrist is providing close team supervision and 24 hour management of active medical problems listed below. Physiatrist and rehab team continue to assess barriers to discharge/monitor patient progress toward functional and medical goals.     FIM: FIM - Bathing Bathing Steps Patient Completed: Chest;Right Arm;Left Arm;Abdomen;Front perineal area;Right upper leg;Left upper leg;Buttocks Bathing: 4: Min-Patient completes 8-9 65f 10 parts or 75+ percent  FIM - Upper Body Dressing/Undressing Upper body dressing/undressing steps patient completed: Thread/unthread right sleeve of pullover shirt/dresss;Thread/unthread left  sleeve of pullover shirt/dress;Put head through opening of pull over shirt/dress;Pull shirt over trunk Upper body dressing/undressing: 4: Steadying assist FIM - Lower Body Dressing/Undressing Lower body dressing/undressing steps patient completed:  Don/Doff right sock;Fasten/unfasten right shoe;Fasten/unfasten left shoe;Don/Doff right shoe;Pull pants up/down;Pull underwear up/down;Don/Doff left sock;Don/Doff left shoe Lower body dressing/undressing: 3: Mod-Patient completed 50-74% of tasks  FIM - Toileting Toileting: 0: Activity did not occur  FIM - Diplomatic Services operational officer Devices: Walker;Grab bars Toilet Transfers: 3-From toilet/BSC: Mod A (lift or lower assist);3-To toilet/BSC: Mod A (lift or lower assist)  FIM - Bed/Chair Transfer Bed/Chair Transfer Assistive Devices: Bed rails;Walker Bed/Chair Transfer: 1: Two helpers  FIM - Locomotion: Printmaker: Wheelchair: 1: Total Assistance/staff pushes wheelchair (Pt<25%) FIM - Locomotion: Ambulation Locomotion: Ambulation Assistive Devices: Designer, industrial/product Ambulation/Gait Assistance: 4: Min assist Locomotion: Ambulation: 1: Travels less than 50 ft with minimal assistance (Pt.>75%)  Comprehension Comprehension Mode: Auditory Comprehension: 2-Understands basic 25 - 49% of the time/requires cueing 51 - 75% of the time  Expression Expression Mode: Verbal Expression: 2-Expresses basic 25 - 49% of the time/requires cueing 50 - 75% of the time. Uses single words/gestures.  Social Interaction Social Interaction: 2-Interacts appropriately 25 - 49% of time - Needs frequent redirection.  Problem Solving Problem Solving: 2-Solves basic 25 - 49% of the time - needs direction more than half the time to initiate, plan or complete simple activities  Memory Memory: 2-Recognizes or recalls 25 - 49% of the time/requires cueing 51 - 75% of the time Medical Problem List and Plan:  1. DVT Prophylaxis/Anticoagulation: Mechanical: Sequential compression devices, below knee Bilateral lower extremities  2. Pain Management: denies any pain at current time. Wife reports arthritis "all over"  3. Mood: Monitor for now. Effexor resumed. LCSW to follow up for formal  evaluation.  4. Neuropsych: This patient is not capable of making decisions on his/her own behalf.  5. CAD: Low dose Ace resumed.  6. Atrial Fibrillation: Off blood thinner due to ICH. Monitor HR with bid checks.  7. Chronic anemia:hgb back to 9.0 after transfusion. He his baseline hgb is around 9.0 8. OSA: Continue CPAP with one liter O2 whenever sleeping.  9. H/O Gastrectomy with gastroparesis: Small meals. Will add snacks to help with intake.  10. Lethargy:sleeping better, stamina improving. Not very anxious to start a stimulant here given his multiple medical issues.  -repeat CT was done showing resolution of blood  -all lab work essentially stable. He's a little dry still 11. BPH: continue Proscar daily 12. Right wrist: xray is supsicious for pseudogout, and ?scaphulolunate injury. No MRI due PPM. Manage conservatively  -voltaren gel  -monitor clinically. --seems to be a bit better 13. Wounds: local care  -dc sutures over eye in a few days. 14. FEN-    push po- recheck Thursday  -lasix still held   LOS (Days) 8 A FACE TO FACE EVALUATION WAS PERFORMED  Alexcia Schools T 03/22/2012, 7:04 AM

## 2012-03-22 NOTE — Progress Notes (Signed)
Occupational Therapy Session Note  Patient Details  Name: Eric [name] MRN: [id number] Date of Birth: [birth year]  Today's Date: 03/22/2012 Time: 1015-1100 Time Calculation (min): 45 min  Short Term Goals: Week 1:  OT Short Term Goal 1 (Week 1): Pt would bathe at shower level with mod cues for initiation OT Short Term Goal 2 (Week 1): Pt will perform UB dressing Mod A. OT Short Term Goal 3 (Week 1): Pt will perform sit <> stands Min A for clothing management. OT Short Term Goal 4 (Week 1): Pt will perform 2 of 3 tasks during toileting. OT Short Term Goal 5 (Week 1): Pt will maintain standing balance with Min A during functional activity for 2 min  Skilled Therapeutic Interventions/Progress Updates:    1:1 focus on functional tasks of following 1 step directions, focused to sustained attention, functional ambulation with RW, sit to stands, standing balance with one UE for support.  Performed ambulation around cones in gym, requiring max to total A to steer RW with turns and mod A to stay on task. Sit to stand from mat toe tapping on to exercise step - alternating weight shift and challenging LE strength and balance. Chair push ups, achieving bottom clearance off chair with min to mod A. Warm up exercises for LEs (marching and LE lifts). Pt only oriented to self and "I fell and hit my head ...at the gym" Unable to state [place] or hospital with any cues.   Therapy Documentation Precautions:  Precautions Precautions: Fall Restrictions Weight Bearing Restrictions: No Pain: Pain Assessment Pain Assessment: No/denies pain Pain Score: 0-No pain PAINAD (Pain Assessment in Advanced Dementia) Breathing: normal Negative Vocalization: none Facial Expression: smiling or inexpressive Body Language: relaxed Consolability: no need to console PAINAD Score: 0   See FIM for current functional status  Therapy/Group: Individual Therapy  Roney Mans [facility] 03/22/2012, 11:07 AM

## 2012-03-22 NOTE — Progress Notes (Signed)
Recreational Therapy Session Note  Patient Details  Name: Eric [name] MRN: [id number] Date of Birth: 1925/12/26 Today's Date: 03/22/2012 Time:  1445-1530 Pain: no c/o Skilled Therapeutic Interventions/Progress Updates: out and about w/c level with pt and pt's wife with focus on selective attention in outdoor environment with mod cues during leisure discussion about restaurants he enjoyed eating at.  Discuss potential activities for participation in therapy sessions.  Therapy/Group: Individual Therapy Westly Hinnant 03/22/2012, 4:33 PM

## 2012-03-22 NOTE — Progress Notes (Signed)
Inpatient RehabilitationTeam Conference Note Date: 03/21/2012   Time: 2:45 PM    Patient Name: Eric [name]      Medical Record Number: [id number]  Date of Birth: 01/20/1926 Sex: Male         Room/Bed: 4002/4002-01 Payor Info: Payor: MEDICARE  Plan: MEDICARE PART A AND B  Product Type: *No Product type*     Admitting Diagnosis: ich,fall  Admit Date/Time:  03/14/2012  5:37 PM Admission Comments: No comment available   Primary Diagnosis:  ICH (intracerebral hemorrhage) Principal Problem: ICH (intracerebral hemorrhage)  Patient Active Problem List   Diagnosis Date Noted  . ICH (intracerebral hemorrhage) 03/08/2012  . Hematoma of frontal scalp 03/08/2012  . Pleural effusion on right 03/08/2012  . Gastritis 01/13/2012  . Internal hemorrhoids 01/13/2012  . Iron deficiency anemia, unspecified 01/05/2012  . OSA (obstructive sleep apnea) 09/29/2011  . Chronic diastolic heart failure 07/23/2011  . Recurrent small bowel obstructions 04/07/2011  . Pulmonary nodule 03/10/2011  . Long term (current) use of anticoagulants 03/01/2011  . Gastric stasis with food retention 02/17/2011  . Atrial fibrillation 03/19/2010  . MOBITZ II ATRIOVENTRICULAR BLOCK 02/03/2010  . PACEMAKER, PERMANENT 02/03/2010  . RBBB W/ LAFB 07/30/2009  . CAD, AUTOLOGOUS BYPASS GRAFT 03/06/2009  . OBSTRUCTIVE SLEEP APNEA 01/22/2008  . HYPERLIPIDEMIA 01/19/2008  . HYPERTENSION 01/19/2008  . CEREBROVASCULAR DISEASE 01/19/2008  . ABDOMINAL AORTIC ANEURYSM 01/19/2008  . DEGENERATIVE JOINT DISEASE 01/19/2008  . TRANSIENT ISCHEMIC ATTACKS, HX OF 01/19/2008  . BENIGN PROSTATIC HYPERTROPHY, HX OF 01/19/2008    Expected Discharge Date: Expected Discharge Date: 04/05/12  Team Members Present: Physician: Dr. Faith Rogue Social Worker Present: Amada Jupiter, LCSW Nurse Present: Carmie End, RN PT Present: Reggy Eye, PT OT Present: Roney Mans, OT SLP Present: Feliberto Gottron, SLP Other (Discipline and Name):  Rogene Houston, PPS Coordinator     Current Status/Progress Goal Weekly Team Focus  Medical   tbi, polytrauma, making gradual gains`  increase arousal and awareness  see chartg   Bowel/Bladder   Continent of bowel and bladder with timed toileting/condom catheter at hs.  min assist      Swallow/Nutrition/ Hydration   regular textures, thin liquids, intemrittent supervision  Mod I  attention to meal, small bites   ADL's   supervision  min to mod A  attention, initiation, orientation, sequencing and task organization   Mobility   min-mod  min A  family ed, activity tolerance, attention, strength   Communication   Min A-Mod A  Min A-Supervision  expression of wants/needs    Safety/Cognition/ Behavioral Observations  Max A  Min A  problem solving, orientation, attention   Pain   pain to low back/hands/ voltaren/tylenol given for pain  pain < = 3      Skin   sutures above right eye/scattered bruising  no skin breakdown       Rehab Goals Patient on target to meet rehab goals: Yes *See Interdisciplinary Assessment and Plan and progress notes for long and short-term goals  Barriers to Discharge: ongoing memory, initiation, and awarenss deficits    Possible Resolutions to Barriers:  CPT, NMR, supervision at home    Discharge Planning/Teaching Needs:  home with wife who can provide 24/7 supervision - light assistance      Team Discussion:  Making gradual progress.  Cognition continues very impaired - poor sleep/ rest may be a factor.  Goals have been downgraded to minimal assistance overall.  Concern over hydration - need to push fluids.  Revisions  to Treatment Plan:  Downgraded overall goals.   Continued Need for Acute Rehabilitation Level of Care: The patient requires daily medical management by a physician with specialized training in physical medicine and rehabilitation for the following conditions: Daily direction of a multidisciplinary physical rehabilitation program to ensure  safe treatment while eliciting the highest outcome that is of practical value to the patient.: Yes Daily medical management of patient stability for increased activity during participation in an intensive rehabilitation regime.: Yes Daily analysis of laboratory values and/or radiology reports with any subsequent need for medication adjustment of medical intervention for : Cardiac problems;Neurological problems  Eric [name] 03/22/2012, 2:42 PM

## 2012-03-22 NOTE — Progress Notes (Signed)
Speech Language Pathology Daily Session Note  Patient Details  Name: Eric [name] MRN: [id number] Date of Birth: 11/04/1925  Today's Date: 03/22/2012 Time: 0800-0900 Time Calculation (min): 60 min  Short Term Goals: Week 1: SLP Short Term Goal 1 (Week 1): Pt will orient to place, time and situation with Max A multimodal cueing.  SLP Short Term Goal 2 (Week 1): Pt will sustain attention to a functional task for ~2 minutes with Max A multimodal cueing. SLP Short Term Goal 3 (Week 1): Pt will utilize call bell to express wants/needs with Max A mutlimodal cueing.  SLP Short Term Goal 4 (Week 1): Pt will identify 1 physical and 1 cognitive deficit with Max A multimodal cueing SLP Short Term Goal 5 (Week 1): Pt will utilize swallowing compensatory strategies of slow rate and small bites with max verbal cues.  SLP Short Term Goal 6 (Week 1): Pt will self-monitor and correct oral residue with max verbal and question cues.   Skilled Therapeutic Interventions: Treatment focus on dysphagia and cognitive goals. Pt consumed regular textures and demonstrated efficient mastication with minimal oral residue that cleared with spontaneous lingual sweeps but required supervision verbal cues to utilize small bites. Pt required Mod verbal and visual cues for initiation of meal and Max A verbal and question cues for selective attention to task and for functional problem solving with tray set-up. Pt also required total A for orientation to situation, time and place. Pt independently requested to use the bathroom but required total A for transfer to wheelchair. Recommend pt be changed from full supervision to intermittent supervision with meals.    FIM:  Comprehension Comprehension Mode: Auditory Comprehension: 2-Understands basic 25 - 49% of the time/requires cueing 51 - 75% of the time Expression Expression Mode: Verbal Expression: 2-Expresses basic 25 - 49% of the time/requires cueing 50 - 75% of the time.  Uses single words/gestures. Social Interaction Social Interaction: 2-Interacts appropriately 25 - 49% of time - Needs frequent redirection. Problem Solving Problem Solving: 1-Solves basic less than 25% of the time - needs direction nearly all the time or does not effectively solve problems and may need a restraint for safety Memory Memory: 1-Recognizes or recalls less than 25% of the time/requires cueing greater than 75% of the time FIM - Eating Eating Activity: 5: Set-up assist for open containers;5: Needs verbal cues/supervision  Pain Pain Assessment Pain Assessment: No/denies pain  Therapy/Group: Individual Therapy  Ashima Shrake 03/22/2012, 1:33 PM

## 2012-03-22 NOTE — Progress Notes (Addendum)
Physical Therapy Session Note  Patient Details  Name: Eric [name] MRN: [id number] Date of Birth: 1925-12-02  Today's Date: 03/22/2012 Time: 1328-1410 Time Calculation (min): 42 min  Short Term Goals: Week 1:  PT Short Term Goal 1 (Week 1): Pt will perform functional transfers with min A PT Short Term Goal 1 - Progress (Week 1): Progressing toward goal PT Short Term Goal 2 (Week 1): Pt will demo selective attention to functional task x 1 minute with min A PT Short Term Goal 2 - Progress (Week 1): Progressing toward goal PT Short Term Goal 3 (Week 1): Pt will gait in controlled environment with min A 50' PT Short Term Goal 3 - Progress (Week 1): Progressing toward goal  Skilled Therapeutic Interventions/Progress Updates:    This session focused on upright gait with RW, transfers, cognition and repeated sit to stands for leg strengthening, endurance and safety instruction.  See below for details.  Pt is generally at a mod assist level.    Therapy Documentation Precautions:  Precautions Precautions: Fall Restrictions Weight Bearing Restrictions: No General: Amount of Missed PT Time (min): 18 Minutes Missed Time Reason: Other (comment) (wife wants pt to finish eating lunch) Pain: Pain Assessment Pain Assessment: No/denies pain Mobility: Transfers Sit to Stand: 3: Mod assist;With armrests;With upper extremity assist;From bed;From chair/3-in-1 Sit to Stand Details: Tactile cues for posture;Verbal cues for precautions/safety;Verbal cues for sequencing;Manual facilitation for weight shifting;Manual facilitation for placement Sit to Stand Details (indicate cue type and reason): cues for safe upper extremity placement, manual assist at trunk to get up on feet over weak legs and anteriorly wieght shift trunk, verbal cues for posture.   Stand to Sit: 3: Mod assist;With armrests;With upper extremity assist;To chair/3-in-1;To bed Stand to Sit Details (indicate cue type and reason): Verbal  cues for sequencing;Verbal cues for technique;Verbal cues for precautions/safety;Manual facilitation for weight shifting;Manual facilitation for placement Stand to Sit Details: assist needed to control descent to sit Stand Pivot Transfers: 3: Mod assist Stand Pivot Transfer Details: Visual cues/gestures for sequencing;Verbal cues for sequencing;Verbal cues for precautions/safety;Manual facilitation for placement;Manual facilitation for weight shifting Stand Pivot Transfer Details (indicate cue type and reason): transferred from [facility] to mat table both towards left and right sides without RW mod assist to keep trunk anteriorly weight shifted, keep pt's posture upright.   Locomotion : Ambulation Ambulation: Yes Ambulation/Gait Assistance: 3: Mod assist Ambulation Distance (Feet): 40 Feet Assistive device: Rolling walker Ambulation/Gait Assistance Details: Verbal cues for gait pattern;Verbal cues for precautions/safety;Manual facilitation for weight shifting;Tactile cues for posture;Manual facilitation for placement Ambulation/Gait Assistance Details: chair to follow for safety (wife providing assist), verbal and tactile cues for upright posutre, increased step length and increased foot clearance.   Corporate treasurer: Yes Wheelchair Assistance: 1: +1 Total assist Wheelchair Parts Management: Needs assistance Distance: 150     Other Treatments: Treatments Therapeutic Activity: We worked on sit to stand transfers and increased standing endurance as well as upright posture in standing in a moderately distracting environment (big gym with doors open).  Decreased susstained attention.  Longest was 2 mins preforming seated cone tapping task.  Worked on [employer] name recall, attention to task and path finding.  Performed multiple sit to stands working on leg strength, endurance, and safe technique.  Attempted to preform standing cone kick, pt was able to perform one cone kick with his right  foot but was then either too fatuged or too distracted to complete the task with his left foot.  Attempted  multiple times.    See FIM for current functional status  Therapy/Group: Individual Therapy  Lurena Joiner B. Oneil Behney, PT, DPT [phone]   03/22/2012, 5:28 PM

## 2012-03-22 NOTE — Progress Notes (Signed)
Physical Therapy Session Note  Patient Details  Name: Eric [name] MRN: [id number] Date of Birth: 1926-01-04  Today's Date: 03/22/2012 Time: 4098-1191 Time Calculation (min): 45 min  Short Term Goals: Week 1:  PT Short Term Goal 1 (Week 1): Pt will perform functional transfers with min A PT Short Term Goal 1 - Progress (Week 1): Progressing toward goal PT Short Term Goal 2 (Week 1): Pt will demo selective attention to functional task x 1 minute with min A PT Short Term Goal 2 - Progress (Week 1): Progressing toward goal PT Short Term Goal 3 (Week 1): Pt will gait in controlled environment with min A 50' PT Short Term Goal 3 - Progress (Week 1): Progressing toward goal     Therapy Documentation Precautions:  Precautions Precautions: Fall Restrictions Weight Bearing Restrictions: No Pain: Pain Assessment Pain Assessment: No/denies pain Mobility:  Patient assisted PT with w/c propulsion (<25%) from room > PT gym (~100 feet) using bilateral LEs. Locomotion : Ambulation Ambulation: Yes Ambulation/Gait Assistance: 3: Mod assist (with 2 helpers) Ambulation Distance (Feet): 28 Feet (x 2 reps) Patient ambulated 28 feet x 2 reps with RW and mod assist x 2 helpers for AD management and safety. Patient presented with decreased step length and width, shuffling-type gait pattern. Patient presented with increased difficulty with turning to sit, requiring max vcs. PT attempted to use floor ladder to increase step length with little improvement.  Patient required one sitting rest break between walks, in which therapist asked questions to challenge cognition. Patient unable to verbalize relationship between objects. Patient was able to identify categories when given options.    Other Treatments:   Patient performed soccer ball kicks in sitting x 5 reps bilaterally with emphasis on lateral weight shifting and foot clearance. Progressed by having patient stand with RW and perform task, requiring  mod/max assist for steadying/safety. Patient required frequent vcs for attending to task.  Patient attempted playing card matching task in standing. Patient unable to perform consistently and required sitting break after 1 min. Switched task to have patient sort playing cards by color in sitting. Task required patient to reach outside base of support to grab card and place on appropriate color. Patient continued to have some difficulty and at times needed vcs to remind him of the task he was performing and to initiate reaching movement.   See FIM for current functional status  Therapy/Group: Individual Therapy  [name] Hamilton DPT Student  03/22/2012, 4:54 PM

## 2012-03-22 NOTE — Progress Notes (Signed)
INITIAL ADULT NUTRITION ASSESSMENT Date: 03/22/2012   Time: 2:17 PM  Reason for Assessment: Health History  INTERVENTION: 1. Discontinue Boost - pt does not like 2. Ensure Pudding po TID, each supplement provides 170 kcal and 4 grams of protein.  3. MVI daily 4. RD to continue to follow nutrition careplan  DOCUMENTATION CODES Per approved criteria  -Not Applicable   ASSESSMENT: Male 78 y.o.  Dx: ICH (intracerebral hemorrhage)  Hx:  Past Medical History  Diagnosis Date  . BPH (benign prostatic hypertrophy)   . DJD (degenerative joint disease)   . Hx of transient ischemic attack (TIA)   . AAA (abdominal aortic aneurysm)   . Cerebrovascular disease   . HLD (hyperlipidemia)   . HTN (hypertension)   . Second degree AV block, Mobitz type II     s/p PPM  . CAD (coronary artery disease)   . Persistent atrial fibrillation     on pradaxa  . Sleep apnea     cpap machine  . Gastric ulcer, acute 02/2011    multiple - NSAID/Pradaxa thought causative. associated gastroparesis-like problems  . Small bowel obstruction     multiple over the years  . Anemia   . Anemia    Past Surgical History  Procedure Date  . Pacemaker insertion 01/24/10    Adapta [employer]   . Coronary artery bypass graft   . Appendectomy   . Gastrectomy   . Knee arthroscopy     right, with medial and lateral meniscal  . Finger surgery      Left middle finger dorsal lesion  . Upper gastrointestinal endoscopy 03/12/2011    stomach ulcer, esophageal erosion  . Esophagogastroduodenoscopy 01/13/2012    Procedure: ESOPHAGOGASTRODUODENOSCOPY (EGD);  Surgeon: Iva Boop, MD;  Location: [facility];  Service: Endoscopy;  Laterality: N/A;  . Flexible sigmoidoscopy 01/13/2012    Procedure: FLEXIBLE SIGMOIDOSCOPY;  Surgeon: Iva Boop, MD;  Location: [facility];  Service: Endoscopy;  Laterality: N/A;  Fleets enema on arrival   Related Meds:     . acetaminophen  650 mg Oral TID WC & HS  . aspirin EC  325 mg  Oral Daily  . diclofenac sodium  2 g Topical QID  . enalapril  5 mg Oral BID  . finasteride  5 mg Oral Daily  . furosemide  20 mg Intravenous Once  . lactose free nutrition  237 mL Oral TID BM  . pantoprazole  40 mg Oral Q1200  . polyethylene glycol  17 g Oral Daily  . simvastatin  20 mg Oral QPM  . venlafaxine XR  37.5 mg Oral Q breakfast   Ht:   5\' 10"  (177.8 cm)  Wt: 167 lb 5.3 oz (75.9 kg)  Ideal Wt:    166 lb (75.5 kg) % Ideal Wt: 100%  Wt Readings from Last 15 Encounters:  03/22/12 167 lb 5.3 oz (75.9 kg)  03/14/12 167 lb 8.8 oz (76 kg)  02/08/12 167 lb (75.751 kg)  02/01/12 168 lb (76.204 kg)  01/28/12 171 lb 4 oz (77.678 kg)  01/05/12 164 lb 3.2 oz (74.481 kg)  11/03/11 168 lb 12.8 oz (76.567 kg)  09/29/11 166 lb (75.297 kg)  07/23/11 172 lb (78.019 kg)  06/16/11 170 lb (77.111 kg)  05/04/11 169 lb (76.658 kg)  04/07/11 174 lb 9.6 oz (79.198 kg)  03/12/11 157 lb (71.215 kg)  03/10/11 157 lb (71.215 kg)  03/01/11 167 lb (75.751 kg)  Usual Wt: 168 - 170 lb % Usual Wt: 100%  BMI is 23.7 - weight is WNL  Food/Nutrition Related Hx:   Labs:  CMP     Component Value Date/Time   NA 137 03/21/2012 1759   K 4.4 03/21/2012 1759   CL 107 03/21/2012 1759   CO2 21 03/21/2012 1759   GLUCOSE 164* 03/21/2012 1759   BUN 28* 03/21/2012 1759   CREATININE 0.97 03/21/2012 1759   CALCIUM 8.9 03/21/2012 1759   PROT 6.4 03/15/2012 0530   ALBUMIN 3.1* 03/15/2012 0530   AST 21 03/15/2012 0530   ALT 20 03/15/2012 0530   ALKPHOS 89 03/15/2012 0530   BILITOT 1.3* 03/15/2012 0530   GFRNONAA 74* 03/21/2012 1759   GFRAA 85* 03/21/2012 1759    Intake/Output Summary (Last 24 hours) at 03/22/12 1431 Last data filed at 03/22/12 1300  Gross per 24 hour  Intake    990 ml  Output    800 ml  Net    190 ml   Diet Order: Cardiac  Supplements/Tube Feeding: Boost PO TID  IVF:    Estimated Nutritional Needs:   Kcal: 1700 - 1900 kcal Protein:  100 - 110 grams Fluid: 1. 8 - 2 liters  daily  Pt is s/p R-frontal ICH. Pt with hx of gastrectomy and gastroparesis.  Wife states that patient's weight has been stable. Confirms that he is eating mostly 100% of meals. Does not like the Boost that is currently ordered, agreeable to trying pudding instead.  NUTRITION DIAGNOSIS: Increased nutrient needs r/t TBI AEB estimated needs.  MONITORING/EVALUATION(Goals): Goal: Pt to meet >/= 90% of their estimated nutrition needs Monitor: weight trends, lab trends, I/O's, PO intake, supplement tolerance  EDUCATION NEEDS: -Education not appropriate at this time  Jarold Motto MS, RD, LDN Pager: [phone] After-hours pager: [phone]

## 2012-03-22 NOTE — Progress Notes (Signed)
Occupational Therapy Weekly Progress Note  Patient Details  Name: Eric [name] MRN: [id number] Date of Birth: 21-Aug-1925  Today's Date: 03/22/2012 Time: 4098-1191 Time Calculation (min): 60 min  Patient has met 2 of 5 short term goals and is progressing towards other goals. Pt is currently S for UB dressing, Mod A for LB dressing, Min-Mod for bathing, S for grooming, Min-Mod A for sit<>stands and functional mobility with self-care tasks. Pt has made improvements with initiation and attention with Mod verbal cues to initiate ADL tasks and for sustained attention (~2-3 min). Pt currently Rancho Level 5 cognitive functioning. Pt would benefit from continued skilled OT to improve cognitive skills, increase independence with ADLs and decrease caregiver burden upon d/c to private residence with spouse.   Patient continues to demonstrate the following deficits: muscle weakness, decreased cardiorespiratoy endurance, impaired timing and sequencing and decreased motor planning, decreased initiation, decreased attention, decreased awareness, decreased problem solving, decreased safety awareness, decreased memory and delayed processing and decreased sitting balance, decreased standing balance, decreased postural control and decreased balance strategies and therefore will continue to benefit from skilled OT intervention to enhance overall performance with BADL.  Patient progressing toward long term goals..  Continue plan of care.  OT Short Term Goals Week 1:  OT Short Term Goal 1 (Week 1): Pt would bathe at shower level with mod cues for initiation OT Short Term Goal 1 - Progress (Week 1): Met OT Short Term Goal 2 (Week 1): Pt will perform UB dressing Mod A. OT Short Term Goal 2 - Progress (Week 1): Met OT Short Term Goal 3 (Week 1): Pt will perform sit <> stands Min A for clothing management. OT Short Term Goal 3 - Progress (Week 1): Progressing toward goal OT Short Term Goal 4 (Week 1): Pt will perform  2 of 3 tasks during toileting. OT Short Term Goal 4 - Progress (Week 1): Progressing toward goal OT Short Term Goal 5 (Week 1): Pt will maintain standing balance with Min A during functional activity for 2 min OT Short Term Goal 5 - Progress (Week 1): Progressing toward goal Week 2:  OT Short Term Goal 1 (Week 2): Pt will dress UB with minimal verbal cuing. OT Short Term Goal 2 (Week 2): Pt will dress LB Min A consistently. OT Short Term Goal 3 (Week 2): Pt will complete bathing tasks with Min cues for sequencing.  Skilled Therapeutic Interventions/Progress Updates:    Self care retraining with bathing at shower level and dressing with focus on initiation and sequencing of familiar tasks, sit<>stands and standing balance during functional activity and functional mobility with RW. Pt Mod A for mobility with RW to ambulate to BR and perform shower transfer to shower chair. Pt performed bathing tasks Mod A; pt unable to bathe buttocks this session due to inability to let go of grab bar with one hand despite therapist providing stabilization for balance. Pt required Mod verbal cues for initiation and sequencing of bathing and dressing tasks.  Therapy Documentation Precautions:  Precautions Precautions: Fall Restrictions Weight Bearing Restrictions: No Pain: Pain Assessment Pain Assessment: No/denies pain Pain Score: 0-No pain PAINAD (Pain Assessment in Advanced Dementia) Breathing: normal Negative Vocalization: none Facial Expression: smiling or inexpressive Body Language: relaxed Consolability: no need to console PAINAD Score: 0  ADL: See FIM for current functional status  Therapy/Group: Individual Therapy  Jackelyn Poling 03/22/2012, 11:53 AM

## 2012-03-22 NOTE — Progress Notes (Signed)
I have read and agree with the following treatment session.  Dedric Ethington Hall, PT, DPT 

## 2012-03-22 NOTE — Progress Notes (Signed)
Social Work Patient ID: Eric [name], male   DOB: 12/08/1925, 78 y.o.   MRN: [id number]  Met yesterday afternoon with patient and wife to review team conference - wife aware and agreeable with targeted d/c 10/31 with minimal assistance goals.  She agrees that cognition remains impaired, yet feels he is making progress overall.  Wife remains very attentive and present daily at hospital.  Will continue to follow.  Eric [name]

## 2012-03-23 ENCOUNTER — Inpatient Hospital Stay (HOSPITAL_COMMUNITY): Payer: Medicare Other | Admitting: Speech Pathology

## 2012-03-23 ENCOUNTER — Encounter (HOSPITAL_COMMUNITY): Payer: Medicare Other | Admitting: Occupational Therapy

## 2012-03-23 ENCOUNTER — Inpatient Hospital Stay (HOSPITAL_COMMUNITY): Payer: Medicare Other | Admitting: Physical Therapy

## 2012-03-23 ENCOUNTER — Inpatient Hospital Stay (HOSPITAL_COMMUNITY): Payer: Medicare Other | Admitting: Occupational Therapy

## 2012-03-23 NOTE — Progress Notes (Signed)
Pt. Has CPAP from home and is wearing at this time. RT checked the water in machine upon request from family member/sitter in room.  Water level adequate at this time. Pt. Tolerating CPAP well at this time.

## 2012-03-23 NOTE — Progress Notes (Signed)
Occupational Therapy Session Note  Patient Details  Name: Eric [name] MRN: [id number] Date of Birth: December 13, 1925  Today's Date: 03/23/2012 Time: 0930-1030 Time Calculation (min): 60 min  Skilled Therapeutic Interventions/Progress Updates:    Self care retraining with dressing and toileting tasks focusing on attention and initiation. Pt with increased lethargy this session requiring Total verbal cues for alertness/attention to participate in dressing tasks.  Once pt became more alert, able to perform dressing tasks with Total verbal, demonstrative and tactile cues for progression and completion of task. Pt had noted increase in alertness and attention when he expressed the need to use the BR by gesturing and initiating standing. Ambulated to BR using RW and performed toilet transfer with Mod A. Total A for toileting tasks.  Therapy Documentation Precautions:  Precautions Precautions: Fall Restrictions Weight Bearing Restrictions: No Pain: Pain Assessment Pain Assessment: No/denies pain  See FIM for current functional status  Therapy/Group: Individual Therapy  Jackelyn Poling 03/23/2012, 3:11 PM

## 2012-03-23 NOTE — Progress Notes (Signed)
Pt drowsy, lethargic this am, more alert this afternoon. VSS, denies pain, headache (scheduled tylenol). Taking po fluids well as offered. Scalp sutures removed without incident.Eric [name]

## 2012-03-23 NOTE — Progress Notes (Signed)
Subjective/Complaints: Slow to arouse this am. Ophelia Charter says he slept well..  A 12 point review of systems has been performed and if not noted above is otherwise negative.   Objective: Vital Signs: Blood pressure 122/87, pulse 65, temperature 97.5 F (36.4 C), temperature source Axillary, resp. rate 19, weight 75.9 kg (167 lb 5.3 oz), SpO2 100.00%. Ct Head Wo Contrast  03/21/2012  *RADIOLOGY REPORT*  Clinical Data: Mental status  CT HEAD WITHOUT CONTRAST  Technique:  Contiguous axial images were obtained from the base of the skull through the vertex without contrast.  Comparison: CT 03/10/2012  Findings: High density hemorrhage in the right frontal lobe is slightly smaller, now measuring 25 x 17 mm.  There is surrounding edema.  Mild local mass effect is present without midline shift. There is improving scalp hematoma lateral to the orbit on the right.  No new area of hemorrhage.  Moderate atrophy and moderate to advanced chronic microvascular ischemic changes.  No acute ischemic infarct.  Extensive atherosclerotic calcification in the circle of Willis.  IMPRESSION: Improving right frontal hematoma compared with the prior study.  Atrophy and advanced chronic microvascular ischemia.  No acute infarct.   Original Report Authenticated By: Camelia Phenes, M.D.     Basename 03/21/12 1759  WBC 8.1  HGB 10.2*  HCT 34.5*  PLT 183    Basename 03/21/12 1759  NA 137  K 4.4  CL 107  CO2 21  GLUCOSE 164*  BUN 28*  CREATININE 0.97  CALCIUM 8.9   CBG (last 3)  No results found for this basename: GLUCAP:3 in the last 72 hours  Wt Readings from Last 3 Encounters:  03/22/12 75.9 kg (167 lb 5.3 oz)  03/14/12 76 kg (167 lb 8.8 oz)  02/08/12 75.751 kg (167 lb)    Physical Exam:  Constitutional: He appears well-developed and well-nourished.  HENT:  Head: Normocephalic.  Resolving bruises and hematomas over right face and eye Eyes: Pupils are equal, round, and reactive to light.  Right scleral  hemorrhage. Wound over right eye healing. Sutures in tact Neck: Normal range of motion.  Cardiovascular: Normal rate and regular rhythm.  Murmur heard.  Pulmonary/Chest: Effort normal and breath sounds normal.  Abdominal: Soft. Bowel sounds are normal.  Musculoskeletal: He exhibits no edema.  RUE with diffuse ecchymosis at shoulder and biceps area. Right hip/thigh with evidence of hematoma and tenderness mid-thigh. Diffuse ecchymosis right flank to right thigh area. Pain at right scaphoid, with decreased grip. Right shoulder tender with AROM and PROM Neurological: He is alert.  [name] Awake and alert. better attention..  [name] Limited insight. poor day to day memory. Still doesn't remember my name.  Skin: Skin is warm and dry.  Bruising and pain noted over the right hand. He has decreased grip as a result. No change   Assessment/Plan: 1. Functional deficits secondary to traumatic brain injury which require 3+ hours per day of interdisciplinary therapy in a comprehensive inpatient rehab setting. Physiatrist is providing close team supervision and 24 hour management of active medical problems listed below. Physiatrist and rehab team continue to assess barriers to discharge/monitor patient progress toward functional and medical goals.     FIM: FIM - Bathing Bathing Steps Patient Completed: Chest;Right Arm;Left Arm;Abdomen;Front perineal area;Right upper leg;Left upper leg Bathing: 3: Mod-Patient completes 5-7 48f 10 parts or 50-74%  FIM - Upper Body Dressing/Undressing Upper body dressing/undressing steps patient completed: Thread/unthread right sleeve of pullover shirt/dresss;Thread/unthread left sleeve of pullover shirt/dress;Put  head through opening of pull over shirt/dress;Pull shirt over trunk Upper body dressing/undressing: 4: Steadying assist FIM - Lower Body Dressing/Undressing Lower body dressing/undressing steps patient completed:  Thread/unthread right pants leg;Pull pants up/down;Don/Doff left sock;Don/Doff right sock;Don/Doff right shoe;Don/Doff left shoe;Fasten/unfasten right shoe Lower body dressing/undressing: 3: Mod-Patient completed 50-74% of tasks  FIM - Hotel manager Devices: Grab bar or rail for support Toileting: 2: Max-Patient completed 1 of 3 steps  FIM - Diplomatic Services operational officer Devices: Best boy Transfers: 3-To toilet/BSC: Mod A (lift or lower assist)  FIM - Banker Devices: Bed rails;Walker Bed/Chair Transfer: 3: Bed > Chair or W/C: Mod A (lift or lower assist);3: Chair or W/C > Bed: Mod A (lift or lower assist)  FIM - Locomotion: Wheelchair Distance: 150 Locomotion: Wheelchair: 1: Total Assistance/staff pushes wheelchair (Pt<25%) FIM - Locomotion: Ambulation Locomotion: Ambulation Assistive Devices: Designer, industrial/product Ambulation/Gait Assistance: 3: Mod assist Locomotion: Ambulation: 1: Travels less than 50 ft with moderate assistance (Pt: 50 - 74%)  Comprehension Comprehension Mode: Auditory Comprehension: 2-Understands basic 25 - 49% of the time/requires cueing 51 - 75% of the time  Expression Expression Mode: Verbal Expression: 2-Expresses basic 25 - 49% of the time/requires cueing 50 - 75% of the time. Uses single words/gestures.  Social Interaction Social Interaction: 2-Interacts appropriately 25 - 49% of time - Needs frequent redirection.  Problem Solving Problem Solving: 2-Solves basic 25 - 49% of the time - needs direction more than half the time to initiate, plan or complete simple activities  Memory Memory: 2-Recognizes or recalls 25 - 49% of the time/requires cueing 51 - 75% of the time Medical Problem List and Plan:  1. DVT Prophylaxis/Anticoagulation: Mechanical: Sequential compression devices, below knee Bilateral lower extremities  2. Pain Management: denies any pain at current time.  Wife reports arthritis "all over"  3. Mood: Monitor for now. Effexor resumed. LCSW to follow up for formal evaluation.  4. Neuropsych: This patient is not capable of making decisions on his/her own behalf.  5. CAD: Low dose Ace resumed.  6. Atrial Fibrillation: Off blood thinner due to ICH. Monitor HR with bid checks.  7. Chronic anemia:hgb back to 9.0 after transfusion. He his baseline hgb is around 9.0 8. OSA: Continue CPAP with one liter O2 whenever sleeping.  9. H/O Gastrectomy with gastroparesis: Small meals. Will add snacks to help with intake.  10. Lethargy:sleeping better, stamina improving. Not very anxious to start a stimulant here given his multiple medical issues.  -repeat CT was done showing resolution of blood  -all lab work essentially stable. He's a little dry still 11. BPH: continue Proscar daily 12. Right wrist: xray is supsicious for pseudogout, and ?scaphulolunate injury. No MRI due PPM. Manage conservatively  -voltaren gel  -monitor clinically. --seems to be a bit better 13. Wounds: local care  -dc sutures over eye today (perhaps leave in suture within scab) 14. FEN-    push po- recheck bmet  -lasix still held   LOS (Days) 9 A FACE TO FACE EVALUATION WAS PERFORMED  SWARTZ,ZACHARY T 03/23/2012, 7:12 AM

## 2012-03-23 NOTE — Progress Notes (Signed)
Speech Language Pathology Session & Weekly Progress Notes  Patient Details  Name: JAHLEEL STROSCHEIN MRN: [id number] Date of Birth: 1925-09-16  Today's Date: 03/23/2012 Time: 0800-0900 Time Calculation (min): 60 min  Short Term Goals: Week 1: SLP Short Term Goal 1 (Week 1): Pt will orient to place, time and situation with Max A multimodal cueing.  SLP Short Term Goal 1 - Progress (Week 1): Not met SLP Short Term Goal 2 (Week 1): Pt will sustain attention to a functional task for ~2 minutes with Max A multimodal cueing. SLP Short Term Goal 2 - Progress (Week 1): Met SLP Short Term Goal 3 (Week 1): Pt will utilize call bell to express wants/needs with Max A mutlimodal cueing.  SLP Short Term Goal 3 - Progress (Week 1): Not met SLP Short Term Goal 4 (Week 1): Pt will identify 1 physical and 1 cognitive deficit with Max A multimodal cueing SLP Short Term Goal 4 - Progress (Week 1): Not met SLP Short Term Goal 5 (Week 1): Pt will utilize swallowing compensatory strategies of slow rate and small bites with max verbal cues.  SLP Short Term Goal 5 - Progress (Week 1): Met SLP Short Term Goal 6 (Week 1): Pt will self-monitor and correct oral residue with max verbal and question cues.  SLP Short Term Goal 6 - Progress (Week 1): Met  New Short Term Goals: Week 2: SLP Short Term Goal 1 (Week 2): Pt will consume thin liquids via straw without overt s/s of aspiraiton with Mod I.  SLP Short Term Goal 2 (Week 2): Pt will sustain attention to a functional task for 5 minutes with Max A multimodal cueing SLP Short Term Goal 3 (Week 2): Pt will identify 1 physical and 1 cognitive task with Max A multimodal cueing SLP Short Term Goal 4 (Week 2): Pt will express wants/needs with Max A mutlimodal cueing SLP Short Term Goal 5 (Week 2): Pt will orient to place, time and situation with Max A multimodal cueing  Weekly Progress Updates: Pt has made functional gains and has met 3 out of 6 STG's this reporting period.   Currently pt is consuming regular textures with thin liquids without overt s/s of aspiration with intermittent supervision for utilization of small bites. Pt continues to demonstrate behaviors consistent with a Rancho Level V and requires Max A multimodal cueing for orientation, initiation, sustained attention, working memory, problem solving and expression of wants/needs.  Pt would benefit from continued skilled SLP intervention to maximize cognitive function  and overall independence for discharge home.    SLP Frequency: 1-2 X/day, 30-60 minutes Estimated Length of Stay: 2 weeks SLP Treatment/Interventions: Cognitive remediation/compensation;Cueing hierarchy;Functional tasks;Environmental controls;Internal/external aids;Therapeutic Activities;Patient/family education  Daily Session FIM:  Comprehension Comprehension Mode: Auditory Comprehension: 2-Understands basic 25 - 49% of the time/requires cueing 51 - 75% of the time Expression Expression: 2-Expresses basic 25 - 49% of the time/requires cueing 50 - 75% of the time. Uses single words/gestures. Social Interaction Social Interaction: 2-Interacts appropriately 25 - 49% of time - Needs frequent redirection. Problem Solving Problem Solving: 1-Solves basic less than 25% of the time - needs direction nearly all the time or does not effectively solve problems and may need a restraint for safety Memory Memory: 1-Recognizes or recalls less than 25% of the time/requires cueing greater than 75% of the time Pain Pain Assessment Pain Assessment: No/denies pain  Therapy/Group: Individual Therapy  Camilo Mander 03/23/2012, 11:57 AM

## 2012-03-23 NOTE — Progress Notes (Signed)
Physical Therapy Note  Patient Details  Name: Eric [name] MRN: [id number] Date of Birth: 05-05-1926 Today's Date: 03/23/2012  Time: 1045-1140 55 minutes  No c/o pain.  Short distance gait training with min A, pt required only min manual facilitation for wt shifting with no episodes of freezing today.  Household gait training with total A for problem solving obstacle negotiation, manual facilitation to prevent posterior loss of balance.  Stair training x 5 stairs with min A, increased time and mod cuing for initiation and sequencing of task.  Standing card game playing "blackjack".  Pt able to attend and add cards with supervision-min A, unable to verbalize when he had "won" the game.  Pt required total A for turn taking and sequencing throughout game.  Car transfer with min A to enter car, max-total A to stand for low car.  Pt's niece reports he needed assistance to stand from car previously.  Overall pt with delayed processing and initiation today requiring increased cuing throughout session.  Individual therapy   Takiah Maiden 03/23/2012, 12:13 PM

## 2012-03-23 NOTE — Progress Notes (Signed)
Physical Therapy Weekly Progress Note  Patient Details  Name: Eric [name] MRN: [id number] Date of Birth: 11/29/1925  Today's Date: 03/23/2012  Patient has met 2 of 3 short term goals.  Pt able to perform functional mobility at min A level when rested but requires increased assistance when fatigued.  Pt limited to short distance gait by decreased attention and fatigue.  Pt continues with cognitive deficits of decreased attention and awareness limiting progress with mobility and gait.  Patient continues to demonstrate the following deficits: impaired cognition, decreased activity tolerance, impaired strength and balance and therefore will continue to benefit from skilled PT intervention to enhance overall performance with activity tolerance, balance, postural control, ability to compensate for deficits, attention and awareness.  Patient not progressing toward long term goals.  See goal revision..  Continue plan of care.  PT Short Term Goals Week 1:  PT Short Term Goal 1 (Week 1): Pt will perform functional transfers with min A PT Short Term Goal 1 - Progress (Week 1): Met PT Short Term Goal 2 (Week 1): Pt will demo selective attention to functional task x 1 minute with min A PT Short Term Goal 2 - Progress (Week 1): Met PT Short Term Goal 3 (Week 1): Pt will gait in controlled environment with min A 50' PT Short Term Goal 3 - Progress (Week 1): Progressing toward goal Week 2:  PT Short Term Goal 1 (Week 2): Pt will consistently perform sit to stand transfers with min A PT Short Term Goal 2 (Week 2): Pt will gait with min A 50' in controlled environment PT Short Term Goal 3 (Week 2): Pt will attend to functional task in standing x 90 seconds with min A  Skilled Therapeutic Interventions/Progress Updates:  Ambulation/gait training;Community reintegration;DME/adaptive equipment instruction;Neuromuscular re-education;Therapeutic Exercise;UE/LE Strength taining/ROM;Balance/vestibular  training;Discharge planning;Cognitive remediation/compensation;Functional mobility training;Therapeutic Activities;UE/LE Coordination activities;Wheelchair propulsion/positioning;Stair training;Patient/family education;Pain management;Splinting/orthotics   See FIM for current functional status    Eron Staat 03/23/2012, 7:58 AM

## 2012-03-23 NOTE — Progress Notes (Signed)
This note has been reviewed and this clinician agrees with information provided.  

## 2012-03-23 NOTE — Progress Notes (Signed)
Occupational Therapy Session Note  Patient Details  Name: Eric [name] MRN: [id number] Date of Birth: April 05, 1926  Today's Date: 03/23/2012 Time: 1300-1330 Time Calculation (min): 30 min  Short Term Goals: Week 2:  OT Short Term Goal 1 (Week 2): Pt will dress UB with minimal verbal cuing. OT Short Term Goal 2 (Week 2): Pt will dress LB Min A consistently. OT Short Term Goal 3 (Week 2): Pt will complete bathing tasks with Min cues for sequencing.  Skilled Therapeutic Interventions/Progress Updates:    1:1 focus on functional ambulation with RW from recliner to toilet in bathroom with min A with turning in bathroom, focusing toileting steps with steady A with max instructional cuing, ambulation with turns to get to the sink to wash hands. Pt needed mod to max cuing to wash hands thoroughly with soap and water and locate paper towels on his right to dry hands due to decreased attention. Pt took 5 min to turn around at sink to return back to recliner due to decreased attention and intellectual awareness. Total A to make the turn but pt when asked kept reporting he didn't need help, he wasn't stuck. Performed GOAT this week and pt scored 17/100 with increased awareness to look at "orientation station." not oriented to year or month. No recall of events of the day.  Therapy Documentation Precautions:  Precautions Precautions: Fall Restrictions Weight Bearing Restrictions: No Pain:  no c/o pain  See FIM for current functional status  Therapy/Group: Individual Therapy  Roney Mans [facility] 03/23/2012, 4:38 PM

## 2012-03-23 NOTE — Progress Notes (Signed)
Physical Therapy Session Note  Patient Details  Name: Eric [name] MRN: [id number] Date of Birth: 1925/12/06  Today's Date: 03/23/2012 Time: 4782-9562 Time Calculation (min): 62 min  Short Term Goals: Week 2:  PT Short Term Goal 1 (Week 2): Pt will consistently perform sit to stand transfers with min A PT Short Term Goal 2 (Week 2): Pt will gait with min A 50' in controlled environment PT Short Term Goal 3 (Week 2): Pt will attend to functional task in standing x 90 seconds with min A  Skilled Therapeutic Interventions/Progress Updates:    This session focused on upright gait pattern with increased foot clearance, STM recall, sit to stands safety, hand placement and eccentric control down to sit and WC mobility.  He was overall a mod assist level due to afternoon physical and mental fatigue.  See details below.    Therapy Documentation Precautions:  Precautions Precautions: Fall Restrictions Weight Bearing Restrictions: No General:   Vital Signs: Therapy Vitals Temp: 98.1 F (36.7 C) Temp src: Oral Pulse Rate: 65  Resp: 20  BP: 154/82 mmHg Patient Position, if appropriate: Sitting Oxygen Therapy SpO2: 96 % O2 Device: None (Room air) Pain: Pain Assessment Pain Assessment: No/denies pain Pain Score: 0-No pain Mobility: Bed Mobility Supine to Sit: 3: Mod assist;With rails;HOB elevated Supine to Sit Details: Verbal cues for sequencing;Verbal cues for technique;Verbal cues for precautions/safety;Manual facilitation for weight shifting Supine to Sit Details (indicate cue type and reason): pt was fatigued at end of session.  It took a lot more mental and physcal assist to get OOB.   Sit to Supine: 4: Min assist;With rail;HOB flat Sit to Supine - Details (indicate cue type and reason): min assist to help pt get his right foot all the way in the bed.   Transfers Sit to Stand: 3: Mod assist;From bed;With upper extremity assist;With armrests;From chair/3-in-1 Sit to Stand  Details (indicate cue type and reason): mod assist to help support trunk and anteriorly weight shift body.  needed constant verbal cues for safe hand placement.   Stand to Sit: 3: Mod assist;To chair/3-in-1;To bed;With armrests;With upper extremity assist Stand to Sit Details: mod assist to help lower to sitting slowly and max verbal cues for safe hand placement.   Stand Pivot Transfers: 3: Mod assist Stand Pivot Transfer Details (indicate cue type and reason): up to mod assist due to fatigue to get from [facility] to mat table to [facility] to bed.  Mod assist to support trunk for balance over weak and buckling knees.   Locomotion : Ambulation Ambulation/Gait Assistance: 3: Mod assist Ambulation Distance (Feet): 40 Feet Assistive device: Rolling walker Ambulation/Gait Assistance Details: Verbal cues for gait pattern;Verbal cues for precautions/safety;Manual facilitation for weight shifting;Tactile cues for posture;Manual facilitation for placement Ambulation/Gait Assistance Details: verbal and tactile cued for upright posture, increased step clearance and increased step length.   Corporate treasurer: Yes Wheelchair Assistance: 2: Max Chiropodist Details: Verbal cues for sequencing;Verbal cues for technique;Manual facilitation for [employer]: Both lower extermities;Left upper extremity;Other (comment) (pt using left arm to pull forward on rail in hall) Wheelchair Parts Management: Needs assistance Distance: 100     Balance: Static Sitting Balance Static Sitting - Balance Support: No upper extremity supported;Feet supported Static Sitting - Level of Assistance: 5: Stand by assistance Static Standing Balance Static Standing - Balance Support: Right upper extremity supported;Left upper extremity supported Static Standing - Level of Assistance: 3: Mod assist Static Standing - Comment/# of Minutes:  during sit to stand practice    Other  Treatments: Treatments Therapeutic Activity: We worked in the more quiet ortho gym to limit distractions.  We worked on sit to stands eccentrically lowering to sit, standing marches for endurance and foot clearance training, and transfers into and out of the WC using the RW.  Cognitively we worked on the pt's ability to recall my name, recall what tasks were at hand, and do simple addition/subtraction.  The patient had much difficulty with all of the cognitive tasks.    See FIM for current functional status  Therapy/Group: Individual Therapy  Ray Gervasi, Claretta Fraise 03/23/2012, 5:25 PM

## 2012-03-24 ENCOUNTER — Inpatient Hospital Stay (HOSPITAL_COMMUNITY): Payer: Medicare Other | Admitting: Physical Therapy

## 2012-03-24 ENCOUNTER — Inpatient Hospital Stay (HOSPITAL_COMMUNITY): Payer: Medicare Other | Admitting: Occupational Therapy

## 2012-03-24 ENCOUNTER — Encounter (HOSPITAL_COMMUNITY): Payer: Medicare Other | Admitting: Occupational Therapy

## 2012-03-24 ENCOUNTER — Inpatient Hospital Stay (HOSPITAL_COMMUNITY): Payer: Medicare Other | Admitting: Speech Pathology

## 2012-03-24 DIAGNOSIS — W19XXXA Unspecified fall, initial encounter: Secondary | ICD-10-CM

## 2012-03-24 DIAGNOSIS — Z5189 Encounter for other specified aftercare: Secondary | ICD-10-CM

## 2012-03-24 DIAGNOSIS — S93409A Sprain of unspecified ligament of unspecified ankle, initial encounter: Secondary | ICD-10-CM

## 2012-03-24 LAB — BASIC METABOLIC PANEL
BUN: 21 mg/dL (ref 6–23)
CO2: 27 mEq/L (ref 19–32)
Chloride: 104 mEq/L (ref 96–112)
Creatinine, Ser: 0.84 mg/dL (ref 0.50–1.35)
Glucose, Bld: 95 mg/dL (ref 70–99)

## 2012-03-24 NOTE — Progress Notes (Signed)
Subjective/Complaints: No problems reported overnight.  A 12 point review of systems has been performed and if not noted above is otherwise negative.   Objective: Vital Signs: Blood pressure 146/87, pulse 67, temperature 97.5 F (36.4 C), temperature source Oral, resp. rate 19, weight 76.9 kg (169 lb 8.5 oz), SpO2 100.00%. No results found.  Basename 03/21/12 1759  WBC 8.1  HGB 10.2*  HCT 34.5*  PLT 183    Basename 03/21/12 1759  NA 137  K 4.4  CL 107  CO2 21  GLUCOSE 164*  BUN 28*  CREATININE 0.97  CALCIUM 8.9   CBG (last 3)  No results found for this basename: GLUCAP:3 in the last 72 hours  Wt Readings from Last 3 Encounters:  03/24/12 76.9 kg (169 lb 8.5 oz)  03/14/12 76 kg (167 lb 8.8 oz)  02/08/12 75.751 kg (167 lb)    Physical Exam:  Constitutional: He appears well-developed and well-nourished.  HENT:  Head: Normocephalic.  Resolving bruises and hematomas over right face and eye Eyes: Pupils are equal, round, and reactive to light.  Right scleral hemorrhage. Wound over right eye healing. Sutures in tact Neck: Normal range of motion.  Cardiovascular: Normal rate and regular rhythm.  Murmur heard.  Pulmonary/Chest: Effort normal and breath sounds normal.  Abdominal: Soft. Bowel sounds are normal.  Musculoskeletal: He exhibits no edema.  RUE with diffuse ecchymosis at shoulder and biceps area. Right hip/thigh with evidence of hematoma and tenderness mid-thigh. Diffuse ecchymosis right flank to right thigh area. Pain at right scaphoid, with decreased grip. Right shoulder tender with AROM and PROM Neurological: He is alert.  [name] Awake and alert. better attention..  [name] Limited insight. poor day to day memory. Still doesn't remember my name. Skin: Skin is warm and dry.  Bruising and pain noted over the right hand. He has decreased grip as a result. No change   Assessment/Plan: 1. Functional deficits secondary to  traumatic brain injury which require 3+ hours per day of interdisciplinary therapy in a comprehensive inpatient rehab setting. Physiatrist is providing close team supervision and 24 hour management of active medical problems listed below. Physiatrist and rehab team continue to assess barriers to discharge/monitor patient progress toward functional and medical goals.     FIM: FIM - Bathing Bathing Steps Patient Completed: Chest;Right Arm;Left Arm;Abdomen;Front perineal area;Right upper leg;Left upper leg Bathing: 3: Mod-Patient completes 5-7 55f 10 parts or 50-74%  FIM - Upper Body Dressing/Undressing Upper body dressing/undressing steps patient completed: Thread/unthread left sleeve of pullover shirt/dress;Put head through opening of pull over shirt/dress;Pull shirt over trunk Upper body dressing/undressing: 4: Min-Patient completed 75 plus % of tasks FIM - Lower Body Dressing/Undressing Lower body dressing/undressing steps patient completed: Thread/unthread right pants leg;Pull pants up/down;Don/Doff left sock;Don/Doff right sock;Don/Doff right shoe;Don/Doff left shoe;Fasten/unfasten right shoe Lower body dressing/undressing: 1: Total-Patient completed less than 25% of tasks  FIM - Hotel manager Devices: Grab bar or rail for support Toileting: 1: Total-Patient completed zero steps, helper did all 3  FIM - Diplomatic Services operational officer Devices: Best boy Transfers: 3-To toilet/BSC: Mod A (lift or lower assist);3-From toilet/BSC: Mod A (lift or lower assist)  FIM - Banker Devices: Walker;Arm rests;HOB elevated Bed/Chair Transfer: 3: Supine > Sit: Mod A (lifting assist/Pt. 50-74%/lift 2 legs;4: Sit > Supine: Min A (steadying pt. > 75%/lift 1 leg);3: Bed > Chair or W/C: Mod A (lift or lower assist);3: Chair or W/C > Bed: Mod  A (lift or lower assist)  FIM - Locomotion: Wheelchair Distance: 100 Locomotion:  Wheelchair: 2: Travels 50 - 149 ft with maximal assistance (Pt: 25 - 49%) FIM - Locomotion: Ambulation Locomotion: Ambulation Assistive Devices: Designer, industrial/product Ambulation/Gait Assistance: 3: Mod assist Locomotion: Ambulation: 1: Travels less than 50 ft with moderate assistance (Pt: 50 - 74%)  Comprehension Comprehension Mode: Auditory Comprehension: 2-Understands basic 25 - 49% of the time/requires cueing 51 - 75% of the time  Expression Expression Mode: Verbal Expression: 2-Expresses basic 25 - 49% of the time/requires cueing 50 - 75% of the time. Uses single words/gestures.  Social Interaction Social Interaction: 2-Interacts appropriately 25 - 49% of time - Needs frequent redirection.  Problem Solving Problem Solving: 1-Solves basic less than 25% of the time - needs direction nearly all the time or does not effectively solve problems and may need a restraint for safety  Memory Memory: 1-Recognizes or recalls less than 25% of the time/requires cueing greater than 75% of the time Medical Problem List and Plan:  1. DVT Prophylaxis/Anticoagulation: Mechanical: Sequential compression devices, below knee Bilateral lower extremities  2. Pain Management: denies any pain at current time. Wife reports arthritis "all over"  3. Mood: Monitor for now. Effexor resumed. LCSW to follow up for formal evaluation.  4. Neuropsych: This patient is not capable of making decisions on his/her own behalf.  5. CAD: Low dose Ace resumed.  6. Atrial Fibrillation: Off blood thinner due to ICH. Monitor HR with bid checks.  7. Chronic anemia:hgb back to 9.0 after transfusion. He his baseline hgb is around 9.0 8. OSA: Continue CPAP with one liter O2 whenever sleeping.  9. H/O Gastrectomy with gastroparesis: Small meals. Will add snacks to help with intake.  10. Lethargy:sleeping better, stamina improving. Not very anxious to start a stimulant here given his multiple medical issues.  -repeat CT was done showing  resolution of blood  -all lab work essentially stable. He's a little dry still 11. BPH: continue Proscar daily 12. Right wrist: xray is supsicious for pseudogout, and ?scaphulolunate injury. No MRI due PPM. Manage conservatively  -voltaren gel  -monitor clinically. --seems to be a bit better 13. Wounds: local care  -dc sutures over eye today (perhaps leave in suture within scab) 14. FEN-    push po- recheck bmet today  -lasix still held   LOS (Days) 10 A FACE TO FACE EVALUATION WAS PERFORMED  SWARTZ,ZACHARY T 03/24/2012, 6:54 AM

## 2012-03-24 NOTE — Progress Notes (Signed)
This note has been reviewed and this clinician agrees with information provided.  

## 2012-03-24 NOTE — Progress Notes (Signed)
Patient 's wife continues to refuse tylenol during the day due to symptoms of "sleepy and tired " after tylenol . Patient does receive at hs to help him rest . Patient tolerating therapy today . Difficult to arouse after lunch at times . Continue with plan of care .                   Cleotilde Neer

## 2012-03-24 NOTE — Progress Notes (Signed)
Physical Therapy Note  Patient Details  Name: RILEY HALLUM MRN: [id number] Date of Birth: 25-Feb-1926 Today's Date: 03/24/2012  Time: 1330-1410 40 minutes  No c/o pain.  Pt found asleep in recliner chair.  Attempted to arouse pt, pt would respond briefly then return to sleeping.  Sternal rub, cold cloth, bright lights all would wake pt briefly but he returned to sleep.  Had pt's wife attempt to arouse pt but pt becoming agitated, continuing to attempt to return to sleep.  After 25 minutes pt able to be awake enough to perform sit to stand transfer, then required max A for standing balance.  After 3 attempts with max A pt able to gait 10' to w/c.  Attempted to have pt engage in activity naming family members from pictures.  Pt initially named family members, then required total A with decreased attention and increased fatigue.  Pt's RN aware of difficulty arousing pt.  Individual therapy   Meldrick Buttery 03/24/2012, 4:11 PM

## 2012-03-24 NOTE — Progress Notes (Signed)
Speech Language Pathology Daily Session Note  Patient Details  Name: Eric [name] MRN: [id number] Date of Birth: Oct 22, 1925  Today's Date: 03/24/2012 Time: 0800-0900 Time Calculation (min): 60 min  Short Term Goals: Week 2: SLP Short Term Goal 1 (Week 2): Pt will consume thin liquids via straw without overt s/s of aspiraiton with Mod I.  SLP Short Term Goal 2 (Week 2): Pt will sustain attention to a functional task for 5 minutes with Max A multimodal cueing SLP Short Term Goal 3 (Week 2): Pt will identify 1 physical and 1 cognitive task with Max A multimodal cueing SLP Short Term Goal 4 (Week 2): Pt will express wants/needs with Max A mutlimodal cueing SLP Short Term Goal 5 (Week 2): Pt will orient to place, time and situation with Max A multimodal cueing  Skilled Therapeutic Interventions: Treatment focus on cognitive goals. Pt initiated meal with Mod I and demonstrated problem solving for tray set up (cutting food, opening containers, etc). with Mod A verbal and visual cues. Pt with increased sustained attention to meal and initiated movement to use the bathroom independently. Pt required Max verbal and visual cues for functional problem solving with grooming tasks (brushing teeth) and required Mod verbal and visual cues for orientation to place, time and situation. Pt's overall initiation, problem solving and attention were improved today during session.    FIM:  Comprehension Comprehension Mode: Auditory Comprehension: 3-Understands basic 50 - 74% of the time/requires cueing 25 - 50%  of the time Expression Expression Mode: Verbal Expression: 3-Expresses basic 50 - 74% of the time/requires cueing 25 - 50% of the time. Needs to repeat parts of sentences. Social Interaction Social Interaction: 2-Interacts appropriately 25 - 49% of time - Needs frequent redirection. Problem Solving Problem Solving: 2-Solves basic 25 - 49% of the time - needs direction more than half the time to  initiate, plan or complete simple activities Memory Memory: 2-Recognizes or recalls 25 - 49% of the time/requires cueing 51 - 75% of the time FIM - Eating Eating Activity: 5: Supervision/cues  Pain Pain Assessment Pain Assessment: No/denies pain  Therapy/Group: Individual Therapy  Jackolyn Geron 03/24/2012, 12:44 PM

## 2012-03-24 NOTE — Progress Notes (Signed)
Physical Therapy Note  Patient Details  Name: Eric [name] MRN: [id number] Date of Birth: [birth year] Today's Date: 03/24/2012  Time: 900-955 55 minutes  No c/o pain.  Gait training controlled environment multiple attempts with min A.  Pt able to increase gait distance to 75' x 2 today.  Household gait training with mod A for balance and RW control with turns, pt better able to self correct during obstacle negotiation.  Standing mobility training with cognitive memory task.  Pt required mod cuing for attention in distracting environment, min-mod cues for working memory throughout task.  Nu step for LE/UE strength and endurance with pt with much improved performance performing x 8 minutes at level 3 > 65 rpm without cuing.  Pt more alert this session resulting in improved mobility and cognitive performance.  Individual therapy   Clebert Wenger 03/24/2012, 9:56 AM

## 2012-03-24 NOTE — Progress Notes (Signed)
Occupational Therapy Session Note  Patient Details  Name: Eric [name] MRN: [id number] Date of Birth: 1925/06/26  Today's Date: 03/24/2012 Time: 4132-4401 Time Calculation (min): 60  Short Term Goals: Week 2:  OT Short Term Goal 1 (Week 2): Pt will dress UB with minimal verbal cuing. OT Short Term Goal 2 (Week 2): Pt will dress LB Min A consistently. OT Short Term Goal 3 (Week 2): Pt will complete bathing tasks with Min cues for sequencing.  Skilled Therapeutic Interventions/Progress Updates:    Self care retraining with toileting, bathing, dressing and grooming tasks focusing on functional mobility with and without RW, sit<>stands and standing tolerance in prep for functional activities, focused to sustained attention to familiar tasks. Pt ambulated to BR using RW with Min A to progress walker and ambulated out of BR without RW which seems to be easier when making turns; however, balance is improved when bilateral UE supported. Pt Min A for sit<>stands for clothing management; required Max instructional cuing for multiple sit<>stands to complete task. Pt. Max verbal cues for completion of self care tasks due to attention impairments.  PM Session: 0272-5366; 51 Min  Co-treat with Speech therapy; Treatment focused on attention and ability to follow one-step commands with simple meal prep activity. Pt. Total multimodal cuing for focused attention to task in order to carryout commands. Pt Min A for sit<>stands from w/c and instructional cues for forward weight shift to stand at counter and retrieve needed items from cabinet. Pt unable to accurately identify proper tools for task from field of three or two. Pt with increased alertness this PM session  Therapy Documentation Precautions:  Precautions Precautions: Fall Restrictions Weight Bearing Restrictions: No Pain: Pain Assessment Pain Assessment: No/denies pain  See FIM for current functional status  Therapy/Group: Individual Therapy  and Co-Treatment  Jackelyn Poling 03/24/2012, 3:50 PM

## 2012-03-24 NOTE — Progress Notes (Signed)
Speech Language Pathology Daily Session Note  Patient Details  Name: Eric [name] MRN: [id number] Date of Birth: [birth year]  Today's Date: 03/24/2012 Time: 1430-1455 Time Calculation (min): 25 min  Short Term Goals: Week 2: SLP Short Term Goal 1 (Week 2): Pt will consume thin liquids via straw without overt s/s of aspiraiton with Mod I.  SLP Short Term Goal 2 (Week 2): Pt will sustain attention to a functional task for 5 minutes with Max A multimodal cueing SLP Short Term Goal 3 (Week 2): Pt will identify 1 physical and 1 cognitive task with Max A multimodal cueing SLP Short Term Goal 4 (Week 2): Pt will express wants/needs with Max A mutlimodal cueing SLP Short Term Goal 5 (Week 2): Pt will orient to place, time and situation with Max A multimodal cueing  Skilled Therapeutic Interventions: Co-treatment with OT with treatment focus on initiation, problem solving and following basic commands. Pt participated in kitchen task of making muffins. Pt required total multimodal cueing to follow 1 step commands and initiate tasks. Pt was also 0% accurate for functional problem solving when given choices from a field of two.    FIM:  Comprehension Comprehension Mode: Auditory Comprehension: 2-Understands basic 25 - 49% of the time/requires cueing 51 - 75% of the time Expression Expression Mode: Verbal Expression: 2-Expresses basic 25 - 49% of the time/requires cueing 50 - 75% of the time. Uses single words/gestures. Social Interaction Social Interaction: 2-Interacts appropriately 25 - 49% of time - Needs frequent redirection. Problem Solving Problem Solving: 1-Solves basic less than 25% of the time - needs direction nearly all the time or does not effectively solve problems and may need a restraint for safety Memory Memory: 1-Recognizes or recalls less than 25% of the time/requires cueing greater than 75% of the time FIM - Eating Eating Activity: 5: Supervision/cues  Pain Pain  Assessment Pain Assessment: No/denies pain  Therapy/Group: Co-treatment   Margaretha Mahan 03/24/2012, 3:51 PM

## 2012-03-24 NOTE — Progress Notes (Signed)
03/24/12 Patient lethargic this am. Unable to arouse and give 0700  Tylenol.  A. Captola Teschner,Lpn

## 2012-03-25 ENCOUNTER — Inpatient Hospital Stay (HOSPITAL_COMMUNITY): Payer: Medicare Other | Admitting: *Deleted

## 2012-03-25 NOTE — Plan of Care (Signed)
Problem: RH BOWEL ELIMINATION Goal: RH STG MANAGE BOWEL WITH ASSISTANCE STG Manage Bowel with Min Assistance.  Outcome: Not Progressing LBM 03-22-12, Goal: RH STG MANAGE BOWEL W/MEDICATION W/ASSISTANCE STG Manage Bowel with Medication with Min Assistance.  Outcome: Not Progressing LBM 03-22-12, Goal: RH STG MANAGE BOWEL W/EQUIPMENT W/ASSISTANCE STG Manage Bowel With Equipment With Min Assistance  Outcome: Not Progressing LBM 03-22-12,

## 2012-03-25 NOTE — Progress Notes (Signed)
Occupational Therapy Session Note  Patient Details  Name: Eric [name] MRN: [id number] Date of Birth: 07-19-1925  Today's Date: 03/25/2012 Time:  - 1115-1215  (60 min)    Short Term Goals: Week 2:  OT Short Term Goal 1 (Week 2): Pt will dress UB with minimal verbal cuing. OT Short Term Goal 2 (Week 2): Pt will dress LB Min A consistently. OT Short Term Goal 3 (Week 2): Pt will complete bathing tasks with Min cues for sequencing.  Skilled Therapeutic Interventions/Progress Updates:    Addressed functional mobility, sit to stand, standing balance, standing tolerance during bathing and dressing session.  Pt. Mod assist initially to go from sit to stand, but progressed to min assist using grab bars.  Pt ambulated with RW from recliner to shower bench with increased time and multimodal cues.  He demonstrated decreased initiation, problem solving during mobility.  During bathing, he required max cues for organizing/sequencing washing body.  He tended to perseverate on face.  Pt.dressed with same issues as described in bathing.  Needed manual facilitation for moving in the transverse plane and tended to move half of the way and sit down.  He demonstrated decreased eccentric control with stand to sit.    Therapy Documentation Precautions:  Precautions Precautions: Fall Restrictions Weight Bearing Restrictions: No :   Pain:  none     See FIM for current functional status  Therapy/Group: Individual Therapy  Humberto Seals 03/25/2012, 12:17 PM

## 2012-03-25 NOTE — Progress Notes (Signed)
Patient ID: Eric [name], male   DOB: February 02, 1926, 78 y.o.   MRN: [id number] Subjective/Complaints: 10/19.  Asymptomatic; daughter at bedside Chest- clear; CV- regular; Abd- soft and non tender; condom cath in place; Extr- no edema A 12 point review of systems has been performed and if not noted above is otherwise negative.   Objective: Vital Signs: Blood pressure 155/77, pulse 61, temperature 97.5 F (36.4 C), temperature source Axillary, resp. rate 16, weight 76.9 kg (169 lb 8.5 oz), SpO2 99.00%. No results found. No results found for this basename: WBC:2,HGB:2,HCT:2,PLT:2 in the last 72 hours  Basename 03/24/12 0630  NA 139  K 4.0  CL 104  CO2 27  GLUCOSE 95  BUN 21  CREATININE 0.84  CALCIUM 8.9   CBG (last 3)  No results found for this basename: GLUCAP:3 in the last 72 hours  Wt Readings from Last 3 Encounters:  03/25/12 76.9 kg (169 lb 8.5 oz)  03/14/12 76 kg (167 lb 8.8 oz)  02/08/12 75.751 kg (167 lb)    Physical Exam:  Constitutional: He appears well-developed and well-nourished.  HENT:  Head: Normocephalic.  Resolving bruises and hematomas over right face and eye Eyes: Pupils are equal, round, and reactive to light.  Right scleral hemorrhage. Wound over right eye healing. Sutures in tact Neck: Normal range of motion.  Cardiovascular: Normal rate and regular rhythm.  Murmur heard.  Pulmonary/Chest: Effort normal and breath sounds normal.  Abdominal: Soft. Bowel sounds are normal.  Musculoskeletal: He exhibits no edema.  RUE with diffuse ecchymosis at shoulder and biceps area. Right hip/thigh with evidence of hematoma and tenderness mid-thigh. Diffuse ecchymosis right flank to right thigh area. Pain at right scaphoid, with decreased grip. Right shoulder tender with AROM and PROM Neurological: He is alert.  [name] Awake and alert. better attention..  [name] Limited insight. poor day to day memory. Still doesn't remember my  name. Skin: Skin is warm and dry.  Bruising and pain noted over the right hand. He has decreased grip as a result. No change   Assessment/Plan: 1. Functional deficits secondary to traumatic brain injury which require 3+ hours per day of interdisciplinary therapy in a comprehensive inpatient rehab setting. Physiatrist is providing close team supervision and 24 hour management of active medical problems listed below. Physiatrist and rehab team continue to assess barriers to discharge/monitor patient progress toward functional and medical goals.     FIM: FIM - Bathing Bathing Steps Patient Completed: Chest;Right Arm;Left Arm;Abdomen;Front perineal area;Right upper leg;Left upper leg Bathing: 3: Mod-Patient completes 5-7 77f 10 parts or 50-74%  FIM - Upper Body Dressing/Undressing Upper body dressing/undressing steps patient completed: Thread/unthread right sleeve of pullover shirt/dresss;Thread/unthread left sleeve of pullover shirt/dress;Pull shirt over trunk;Put head through opening of pull over shirt/dress Upper body dressing/undressing: 5: Set-up assist to: Obtain clothing/put away FIM - Lower Body Dressing/Undressing Lower body dressing/undressing steps patient completed: Thread/unthread right pants leg;Thread/unthread left pants leg;Pull pants up/down;Don/Doff right sock;Don/Doff left sock;Don/Doff right shoe;Don/Doff left shoe;Fasten/unfasten right shoe;Fasten/unfasten left shoe Lower body dressing/undressing: 4: Min-Patient completed 75 plus % of tasks  FIM - Hotel manager Devices: Grab bar or rail for support Toileting: 1: Total-Patient completed zero steps, helper did all 3  FIM - Diplomatic Services operational officer Devices: Grab bars;Walker Toilet Transfers: 3-To toilet/BSC: Mod A (lift or lower assist);4-From toilet/BSC: Min A (steadying Pt. > 75%)  FIM - Banker Devices: Walker;Arm rests;HOB elevated Bed/Chair  Transfer: 4: Bed >  Chair or W/C: Min A (steadying Pt. > 75%)  FIM - Locomotion: Wheelchair Distance: 100 Locomotion: Wheelchair: 2: Travels 50 - 149 ft with maximal assistance (Pt: 25 - 49%) FIM - Locomotion: Ambulation Locomotion: Ambulation Assistive Devices: Designer, industrial/product Ambulation/Gait Assistance: 3: Mod assist Locomotion: Ambulation: 2: Travels 50 - 149 ft with minimal assistance (Pt.>75%)  Comprehension Comprehension Mode: Auditory Comprehension: 2-Understands basic 25 - 49% of the time/requires cueing 51 - 75% of the time  Expression Expression Mode: Verbal Expression: 2-Expresses basic 25 - 49% of the time/requires cueing 50 - 75% of the time. Uses single words/gestures.  Social Interaction Social Interaction: 2-Interacts appropriately 25 - 49% of time - Needs frequent redirection.  Problem Solving Problem Solving: 1-Solves basic less than 25% of the time - needs direction nearly all the time or does not effectively solve problems and may need a restraint for safety  Memory Memory: 1-Recognizes or recalls less than 25% of the time/requires cueing greater than 75% of the time Medical Problem List and Plan:  1. DVT Prophylaxis/Anticoagulation: Mechanical: Sequential compression devices, below knee Bilateral lower extremities  2. Pain Management: denies any pain at current time. Wife reports arthritis "all over"  3. Mood: Monitor for now. Effexor resumed. LCSW to follow up for formal evaluation.  4. Neuropsych: This patient is not capable of making decisions on his/her own behalf.  5. CAD: Low dose Ace resumed.  6. Atrial Fibrillation: Off blood thinner due to ICH. Monitor HR with bid checks.  7. Chronic anemia:hgb back to 9.0 after transfusion. He his baseline hgb is around 9.0 8. OSA: Continue CPAP with one liter O2 whenever sleeping.  9. H/O Gastrectomy with gastroparesis: Small meals. Will add snacks to help with intake.  10. Lethargy:sleeping better, stamina  improving. Not very anxious to start a stimulant here given his multiple medical issues.  -repeat CT was done showing resolution of blood  -all lab work essentially stable. He's a little dry still 11. BPH: continue Proscar daily 12. Right wrist: xray is supsicious for pseudogout, and ?scaphulolunate injury. No MRI due PPM. Manage conservatively  -voltaren gel  -monitor clinically. --seems to be a bit better 13. Wounds: local care  -dc sutures over eye today (perhaps leave in suture within scab) 14. FEN-    push po- recheck bmet today  -lasix still held   LOS (Days) 11 A FACE TO FACE EVALUATION WAS PERFORMED  Rogelia Boga 03/25/2012, 8:59 AM

## 2012-03-26 ENCOUNTER — Inpatient Hospital Stay (HOSPITAL_COMMUNITY): Payer: Medicare Other | Admitting: Physical Therapy

## 2012-03-26 NOTE — Progress Notes (Signed)
Physical Therapy Note  Patient Details  Name: Eric [name] MRN: [id number] Date of Birth: [birth year] Today's Date: 03/26/2012  Time: 1055-1150 55 minutes  No c/o pain.  Gait training outdoors and over carpet for activity tolerance, balance and focus on picking feet up on uneven surfaces.  Pt required max cuing to lift feet over uneven surfaces, min A for gait.  Pt requires max-total A for standing balance without AD due to severe posterior lean.  Pt with improved activity tolerance able to gait multiple attempts of > 50'.  Standing horseshoe toss for attention, sequencing an standing tolerance.  Pt required mod cuing for initiation and attention to task in min distracting environment.  Pt able to follow 1 step commands for horseshoe game, unable to follow 2 step commands without total A.  Pt able to stand 60-90 seconds before fatiguing.  Individual therapy   Jameshia Hayashida 03/26/2012, 4:04 PM

## 2012-03-26 NOTE — Progress Notes (Signed)
Patient ID: JERMICHAEL BELMARES, male   DOB: 06-25-1925, 78 y.o.   MRN: [id number] Patient ID: KALE RONDEAU, male   DOB: Sep 29, 1925, 78 y.o.   MRN: [id number] Subjective/Complaints: 10/20.  Asymptomatic; daughter at bedside-wife concerned about more confusion  HEENT- neg; Chest- clear; CV- regular; Abd- soft and non tender; condom cath in place; Extr- no edema  A 12 point review of systems has been performed and if not noted above is otherwise negative.  BP Readings from Last 3 Encounters:  03/26/12 165/93  03/14/12 117/52  02/08/12 142/80   Objective: Vital Signs: Blood pressure 165/93, pulse 60, temperature 96.9 F (36.1 C), temperature source Axillary, resp. rate 18, weight 74.2 kg (163 lb 9.3 oz), SpO2 99.00%. No results found. No results found for this basename: WBC:2,HGB:2,HCT:2,PLT:2 in the last 72 hours  Basename 03/24/12 0630  NA 139  K 4.0  CL 104  CO2 27  GLUCOSE 95  BUN 21  CREATININE 0.84  CALCIUM 8.9   CBG (last 3)   Basename 03/25/12 1328  GLUCAP 91    Wt Readings from Last 3 Encounters:  03/26/12 74.2 kg (163 lb 9.3 oz)  03/14/12 76 kg (167 lb 8.8 oz)  02/08/12 75.751 kg (167 lb)    Physical Exam:  Constitutional: He appears well-developed and well-nourished.  HENT:  Head: Normocephalic.  Resolving bruises and hematomas over right face and eye Eyes: Pupils are equal, round, and reactive to light.  Right scleral hemorrhage. Wound over right eye healing. Sutures in tact Neck: Normal range of motion.  Cardiovascular: Normal rate and regular rhythm.  Murmur heard.  Pulmonary/Chest: Effort normal and breath sounds normal.  Abdominal: Soft. Bowel sounds are normal.  Musculoskeletal: He exhibits no edema.  RUE with diffuse ecchymosis at shoulder and biceps area. Right hip/thigh with evidence of hematoma and tenderness mid-thigh. Diffuse ecchymosis right flank to right thigh area. Pain at right scaphoid, with decreased grip. Right shoulder tender with AROM  and PROM Neurological: He is alert.  [name] Awake and alert. better attention..  [name] Limited insight. poor day to day memory. Still doesn't remember my name. Skin: Skin is warm and dry.  Bruising and pain noted over the right hand. He has decreased grip as a result. No change   Assessment/Plan: 1. Functional deficits secondary to traumatic brain injury which require 3+ hours per day of interdisciplinary therapy in a comprehensive inpatient rehab setting. Physiatrist is providing close team supervision and 24 hour management of active medical problems listed below. Physiatrist and rehab team continue to assess barriers to discharge/monitor patient progress toward functional and medical goals.     FIM: FIM - Bathing Bathing Steps Patient Completed: Chest;Right Arm;Left Arm;Abdomen;Right upper leg;Left upper leg Bathing: 3: Mod-Patient completes 5-7 76f 10 parts or 50-74%  FIM - Upper Body Dressing/Undressing Upper body dressing/undressing steps patient completed: Thread/unthread right sleeve of pullover shirt/dresss;Thread/unthread left sleeve of pullover shirt/dress;Put head through opening of pull over shirt/dress Upper body dressing/undressing: 4: Min-Patient completed 75 plus % of tasks FIM - Lower Body Dressing/Undressing Lower body dressing/undressing steps patient completed: Thread/unthread right pants leg;Thread/unthread left pants leg;Pull pants up/down;Fasten/unfasten right shoe;Fasten/unfasten left shoe Lower body dressing/undressing: 3: Mod-Patient completed 50-74% of tasks  FIM - Hotel manager Devices: Grab bar or rail for support Toileting: 1: Total-Patient completed zero steps, helper did all 3  FIM - Diplomatic Services operational officer Devices: Grab bars;Walker Toilet Transfers: 3-To toilet/BSC: Mod A (lift or lower assist);4-From toilet/BSC:  Min A (steadying Pt. > 75%)  FIM - Event organiser Devices: Walker;Arm rests;HOB elevated Bed/Chair Transfer: 4: Bed > Chair or W/C: Min A (steadying Pt. > 75%)  FIM - Locomotion: Wheelchair Distance: 100 Locomotion: Wheelchair: 2: Travels 50 - 149 ft with maximal assistance (Pt: 25 - 49%) FIM - Locomotion: Ambulation Locomotion: Ambulation Assistive Devices: Designer, industrial/product Ambulation/Gait Assistance: 3: Mod assist Locomotion: Ambulation: 1: Travels less than 50 ft with moderate assistance (Pt: 50 - 74%)  Comprehension Comprehension Mode: Auditory Comprehension: 2-Understands basic 25 - 49% of the time/requires cueing 51 - 75% of the time  Expression Expression Mode: Verbal Expression: 2-Expresses basic 25 - 49% of the time/requires cueing 50 - 75% of the time. Uses single words/gestures.  Social Interaction Social Interaction: 2-Interacts appropriately 25 - 49% of time - Needs frequent redirection.  Problem Solving Problem Solving: 1-Solves basic less than 25% of the time - needs direction nearly all the time or does not effectively solve problems and may need a restraint for safety  Memory Memory: 1-Recognizes or recalls less than 25% of the time/requires cueing greater than 75% of the time Medical Problem List and Plan:  1. DVT Prophylaxis/Anticoagulation: Mechanical: Sequential compression devices, below knee Bilateral lower extremities  2. Pain Management: denies any pain at current time. Wife reports arthritis "all over"  3. Mood: Monitor for now. Effexor resumed. LCSW to follow up for formal evaluation.  4. Neuropsych: This patient is not capable of making decisions on his/her own behalf.  5. CAD: Low dose Ace resumed.  6. Atrial Fibrillation: Off blood thinner due to ICH. Monitor HR with bid checks.  7. Chronic anemia:hgb back to 9.0 after transfusion. He his baseline hgb is around 9.0 8. OSA: Continue CPAP with one liter O2 whenever sleeping.  9. H/O Gastrectomy with gastroparesis: Small meals. Will  add snacks to help with intake.  10. Lethargy:sleeping better, stamina improving. Not very anxious to start a stimulant here given his multiple medical issues.  -repeat CT was done showing resolution of blood  -all lab work essentially stable. He's a little dry still 11. BPH: continue Proscar daily 12. Right wrist: xray is supsicious for pseudogout, and ?scaphulolunate injury. No MRI due PPM. Manage conservatively  -voltaren gel  -monitor clinically. --seems to be a bit better 13. Wounds: local care  -dc sutures over eye today (perhaps leave in suture within scab) 14. FEN-    push po- recheck bmet today  -lasix still held   LOS (Days) 12 A FACE TO FACE EVALUATION WAS PERFORMED  Rogelia Boga 03/26/2012, 8:22 AM

## 2012-03-27 ENCOUNTER — Inpatient Hospital Stay (HOSPITAL_COMMUNITY): Payer: Medicare Other | Admitting: Physical Therapy

## 2012-03-27 ENCOUNTER — Inpatient Hospital Stay (HOSPITAL_COMMUNITY): Payer: Medicare Other

## 2012-03-27 ENCOUNTER — Inpatient Hospital Stay (HOSPITAL_COMMUNITY): Payer: Medicare Other | Admitting: Speech Pathology

## 2012-03-27 MED ORDER — FUROSEMIDE 20 MG PO TABS
20.0000 mg | ORAL_TABLET | Freq: Every day | ORAL | Status: DC
  Administered 2012-03-27 – 2012-04-03 (×8): 20 mg via ORAL
  Filled 2012-03-27 (×9): qty 1

## 2012-03-27 NOTE — Progress Notes (Signed)
Physical Therapy Note  Patient Details  Name: Eric [name] MRN: [id number] Date of Birth: 1926/04/27 Today's Date: 03/27/2012  Time: [phone] 58 minutes  No c/o pain.  Sit to stand repetitions for strength and activity tolerance with mod A.  Pt required mod cuing for correct hand placement and sequencing throughout repetitions, limited carryover.  Plant watering task for standing balance, gait in household environment, attention and initiation.  Pt required mod A for standing balance with RW.  Max cuing for initiation, problem solving and sequencing.  Pt able to attend to task x 2 minutes in min distracting environment with min cuing.  Household gait with max A for RW negotiation, problem solving obstacles, attention to task.  Stair training for strength and endurance with 2 handrails with min A.  Pt requires total A cuing for attention in mod distracting envirionment.  Standing balance task with reaching with mod A with 1 UE support on RW, limited standing tolerance with fatigue at end of session.  Individual therapy   Delisa Finck 03/27/2012, 10:45 AM

## 2012-03-27 NOTE — Progress Notes (Signed)
Occupational Therapy Session Note  Patient Details  Name: Eric [name] MRN: [id number] Date of Birth: July 16, 1925  Today's Date: 03/27/2012 Time: 1100-1155 Time Calculation (min): 55 min  Short Term Goals: Week 2:  OT Short Term Goal 1 (Week 2): Pt will dress UB with minimal verbal cuing. OT Short Term Goal 2 (Week 2): Pt will dress LB Min A consistently. OT Short Term Goal 3 (Week 2): Pt will complete bathing tasks with Min cues for sequencing.  Skilled Therapeutic Interventions/Progress Updates:    Pt seated in w/c with wife at side.  Pt amb with RW to bathroom for bathing at shower level.  Pt required max verbal and tactile cues to initiate sit<>stand.  Pt required min A and max verbal cues to advance RW when ambulating to bathroom.  Pt initiated bathing tasks when presented wash cloth but required min verbal cues to initiate bathing buttocks.  Pt initiated all dressing tasks when presented with clothing items.  Pt amb with RW in room for home mgmt tasks requiring increased assistance with fatigue.  Pt requires increased verbal cues to advance BLE when fatigued.  Pt required tot A/verbal cues for orientation.  Focus on attention to task, orientation, task initiation and sequencing, standing balance, and safety awareness.    Therapy Documentation Precautions:  Precautions Precautions: Fall Restrictions Weight Bearing Restrictions: No  Pain: Pain Assessment Pain Assessment: Faces Faces Pain Scale: Hurts little more Pain Type: Chronic pain Pain Location: Shoulder Pain Orientation: Right;Left Pain Descriptors: Aching Pain Onset: With Activity Pain Intervention(s): Repositioned  See FIM for current functional status  Therapy/Group: Individual Therapy  Rich Brave 03/27/2012, 12:02 PM

## 2012-03-27 NOTE — Progress Notes (Signed)
Speech Language Pathology Daily Session Notes  Patient Details  Name: Eric [name] MRN: [id number] Date of Birth: 05-04-1926  Today's Date: 03/27/2012  Session 1 Time: 0800-0900 Time Calculation (min): 60 min  Session 2 Time: 7829-5621 Time Calculation: 30 minutes  Short Term Goals: Week 2: SLP Short Term Goal 1 (Week 2): Pt will consume thin liquids via straw without overt s/s of aspiraiton with Mod I.  SLP Short Term Goal 2 (Week 2): Pt will sustain attention to a functional task for 5 minutes with Max A multimodal cueing SLP Short Term Goal 3 (Week 2): Pt will identify 1 physical and 1 cognitive task with Max A multimodal cueing SLP Short Term Goal 4 (Week 2): Pt will express wants/needs with Max A mutlimodal cueing SLP Short Term Goal 5 (Week 2): Pt will orient to place, time and situation with Max A multimodal cueing  Skilled Therapeutic Interventions:  Session 1: Treatment focus on cognitive goals. Pt required Mod verbal and visual cues to initiate meal and Max-Total A multimodal cueing for functional problem solving with tray-step. Pt required Mod verbal cues for sustained attention to task and required total A for functional problem solving with grooming task. Pt demonstrated orientation to place, time and situation with Mod multimodal cueing.    Session 2: Treatment focus on cognitive goals. Pt required total A for problem solving with matching from field of 2 and required total A to self-monitor and correct errors. Pt also required Max multimodal cueing to attend to task for 30-60 seconds.   FIM:  Comprehension Comprehension Mode: Auditory Comprehension: 1-Understands basic less than 25% of the time/requires cueing 75% of the time Expression Expression Mode: Verbal Expression: 1-Expresses basis less than 25% of the time/requires cueing greater than 75% of the time. Social Interaction Social Interaction: 2-Interacts appropriately 25 - 49% of time - Needs frequent  redirection. Problem Solving Problem Solving: 1-Solves basic less than 25% of the time - needs direction nearly all the time or does not effectively solve problems and may need a restraint for safety Memory Memory: 1-Recognizes or recalls less than 25% of the time/requires cueing greater than 75% of the time FIM - Eating Eating Activity: 5: Supervision/cues  Pain Pain Assessment Pain Assessment: No/denies pain Faces Pain Scale: Hurts little more Pain Type: Chronic pain Pain Location: Shoulder Pain Orientation: Right;Left Pain Descriptors: Aching Pain Onset: With Activity Pain Intervention(s): Repositioned  Therapy/Group: Individual Therapy  Verneal Wiers 03/27/2012, 3:33 PM

## 2012-03-27 NOTE — Progress Notes (Signed)
Physical Therapy Note  Patient Details  Name: Eric [name] MRN: [id number] Date of Birth: [birth year] Today's Date: 03/27/2012  Time: 1345-1413 28 minutes  No c/o pain.  Pt easy to arouse this afternoon.  Continues with more delayed processing requiring total cuing for initiation when just waking up.  Pt's initiation and processing improved throughout session.  Short distance gait with min A for straight line, max A for turning and side stepping.  Nustep for attention and LE/UE strength/endurance x 6 minutes level 3.  Sit to stand repetitions with focus on speed of initiation.  Pt with much improved speed after treatment.   Individual therapy   [name] 03/27/2012, 2:13 PM

## 2012-03-27 NOTE — Progress Notes (Signed)
Patient ID: Eric [name], male   DOB: 1926/01/15, 78 y.o.   MRN: [id number]  A 12 point review of systems has been performed and if not noted above is otherwise negative.  BP Readings from Last 3 Encounters:  03/27/12 179/92  03/14/12 117/52  02/08/12 142/80   Objective: Vital Signs: Blood pressure 179/92, pulse 62, temperature 97.6 F (36.4 C), temperature source Oral, resp. rate 20, weight 73.2 kg (161 lb 6 oz), SpO2 100.00%. No results found. No results found for this basename: WBC:2,HGB:2,HCT:2,PLT:2 in the last 72 hours No results found for this basename: NA:2,K:2,CL:2,CO2:2,GLUCOSE:2,BUN:2,CREATININE:2,CALCIUM:2 in the last 72 hours CBG (last 3)   Basename 03/25/12 1328  GLUCAP 91    Wt Readings from Last 3 Encounters:  03/27/12 73.2 kg (161 lb 6 oz)  03/14/12 76 kg (167 lb 8.8 oz)  02/08/12 75.751 kg (167 lb)    Physical Exam:  Constitutional: He appears well-developed and well-nourished.  HENT:  Head: Normocephalic.  Resolving bruises and hematomas over right face and eye Eyes: Pupils are equal, round, and reactive to light.  Right scleral hemorrhage. Wound over right eye healing. Sutures in tact Neck: Normal range of motion.  Cardiovascular: Normal rate and regular rhythm.  Murmur heard.  Pulmonary/Chest: Effort normal and breath sounds normal.  Abdominal: Soft. Bowel sounds are normal.  Musculoskeletal: He exhibits no edema.  RUE with diffuse ecchymosis at shoulder and biceps area. Right hip/thigh with evidence of hematoma and tenderness mid-thigh. Diffuse ecchymosis right flank to right thigh area. Pain at right scaphoid, with decreased grip. Right shoulder tender with AROM and PROM Neurological: He is alert.  [name] Awake and alert. better attention..  [name] Limited insight. poor day to day memory. Still doesn't remember my name. Skin: Skin is warm and dry.  Bruising and pain noted over the right hand. He has decreased  grip as a result. No change   Assessment/Plan: 1. Functional deficits secondary to traumatic brain injury which require 3+ hours per day of interdisciplinary therapy in a comprehensive inpatient rehab setting. Physiatrist is providing close team supervision and 24 hour management of active medical problems listed below. Physiatrist and rehab team continue to assess barriers to discharge/monitor patient progress toward functional and medical goals.     FIM: FIM - Bathing Bathing Steps Patient Completed: Chest;Right Arm;Left Arm;Abdomen;Right upper leg;Left upper leg Bathing: 3: Mod-Patient completes 5-7 59f 10 parts or 50-74%  FIM - Upper Body Dressing/Undressing Upper body dressing/undressing steps patient completed: Thread/unthread right sleeve of pullover shirt/dresss;Thread/unthread left sleeve of pullover shirt/dress;Put head through opening of pull over shirt/dress Upper body dressing/undressing: 4: Min-Patient completed 75 plus % of tasks FIM - Lower Body Dressing/Undressing Lower body dressing/undressing steps patient completed: Thread/unthread right pants leg;Thread/unthread left pants leg;Pull pants up/down;Fasten/unfasten right shoe;Fasten/unfasten left shoe Lower body dressing/undressing: 3: Mod-Patient completed 50-74% of tasks  FIM - Hotel manager Devices: Grab bar or rail for support Toileting: 1: Total-Patient completed zero steps, helper did all 3  FIM - Diplomatic Services operational officer Devices: Grab bars;Walker Toilet Transfers: 3-To toilet/BSC: Mod A (lift or lower assist);4-From toilet/BSC: Min A (steadying Pt. > 75%)  FIM - Bed/Chair Transfer Bed/Chair Transfer Assistive Devices: Therapist, occupational: 3: Bed > Chair or W/C: Mod A (lift or lower assist);3: Chair or W/C > Bed: Mod A (lift or lower assist)  FIM - Locomotion: Wheelchair Distance: 100 Locomotion: Wheelchair: 2: Travels 50 - 149 ft with maximal assistance (Pt: 25 -  49%)  FIM - Locomotion: Ambulation Locomotion: Ambulation Assistive Devices: Walker - Rolling Ambulation/Gait Assistance: 3: Mod assist Locomotion: Ambulation: 1: Travels less than 50 ft with moderate assistance (Pt: 50 - 74%)  Comprehension Comprehension Mode: Auditory Comprehension: 2-Understands basic 25 - 49% of the time/requires cueing 51 - 75% of the time  Expression Expression Mode: Verbal Expression: 2-Expresses basic 25 - 49% of the time/requires cueing 50 - 75% of the time. Uses single words/gestures.  Social Interaction Social Interaction: 2-Interacts appropriately 25 - 49% of time - Needs frequent redirection.  Problem Solving Problem Solving: 1-Solves basic less than 25% of the time - needs direction nearly all the time or does not effectively solve problems and may need a restraint for safety  Memory Memory: 1-Recognizes or recalls less than 25% of the time/requires cueing greater than 75% of the time Medical Problem List and Plan:  1. DVT Prophylaxis/Anticoagulation: Mechanical: Sequential compression devices, below knee Bilateral lower extremities  2. Pain Management: denies any pain at current time. Wife reports arthritis "all over"  3. Mood: Monitor for now. Effexor resumed. LCSW to follow up for formal evaluation.  4. Neuropsych: This patient is not capable of making decisions on his/her own behalf.  5. CAD: Low dose Ace resumed.  6. Atrial Fibrillation: Off blood thinner due to ICH. Monitor HR with bid checks.  7. Chronic anemia:hgb back to 9.0 after transfusion. He his baseline hgb is around 9.0 8. OSA: Continue CPAP with one liter O2 whenever sleeping.  9. H/O Gastrectomy with gastroparesis: Small meals. Will add snacks to help with intake.  10. Lethargy:sleeping better, stamina improving. Not very anxious to start a stimulant here given his multiple medical issues.  -repeat CT was done showing resolution of blood  -i have educated family regarding his BI and  potential recovery given his age and injury  -work up has been negative for any other cause    11. BPH: continue Proscar daily 12. Right wrist: xray is supsicious for pseudogout, and ?scaphulolunate injury. No MRI due PPM. Manage conservatively  -voltaren gel 13. Wounds: local care 14. FEN-    push po-   -resume home lasix  LOS (Days) 13 A FACE TO FACE EVALUATION WAS PERFORMED  Coty Student T 03/27/2012, 7:10 AM

## 2012-03-27 NOTE — Progress Notes (Signed)
Occupational Therapy Session Note  Patient Details  Name: Eric [name] MRN: [id number] Date of Birth: 09-20-1925  Today's Date: 03/27/2012 Time: 7829-5621 Time Calculation (min): 30 min  Short Term Goals: Week 2:  OT Short Term Goal 1 (Week 2): Pt will dress UB with minimal verbal cuing. OT Short Term Goal 2 (Week 2): Pt will dress LB Min A consistently. OT Short Term Goal 3 (Week 2): Pt will complete bathing tasks with Min cues for sequencing.  Skilled Therapeutic Interventions/Progress Updates:    Cognitive retraining with card games focusing on attention and following one-step commands in very minimally distractive environment. Began session playing "black jack" game with Total A verbal cuing due to inability to sustain attention. Downgraded task to game requiring only field of two with Total A to correctly identify which card was "higher or lower." Pt noted to have difficulty switching from one task to the other and tended to answer questions with the same word/number over and over again (most likely due to decreased attention to question/task vs. Perseveration). Pt also required Total A demonstrative, gestural and verbal cues for sorting task from field of two and for follow through with simple, one-step commands.   Therapy Documentation Precautions:  Precautions Precautions: Fall Restrictions Weight Bearing Restrictions: No Pain: No c/o pain this session See FIM for current functional status  Therapy/Group: Individual Therapy  Jackelyn Poling 03/27/2012, 3:28 PM  Note reviewed and accurately reflects treatment session.  Ardis Rowan, COTA/L

## 2012-03-28 ENCOUNTER — Inpatient Hospital Stay (HOSPITAL_COMMUNITY): Payer: Medicare Other | Admitting: Occupational Therapy

## 2012-03-28 ENCOUNTER — Encounter (HOSPITAL_COMMUNITY): Payer: Medicare Other | Admitting: Occupational Therapy

## 2012-03-28 ENCOUNTER — Inpatient Hospital Stay (HOSPITAL_COMMUNITY): Payer: Medicare Other | Admitting: Speech Pathology

## 2012-03-28 ENCOUNTER — Inpatient Hospital Stay (HOSPITAL_COMMUNITY): Payer: Medicare Other | Admitting: *Deleted

## 2012-03-28 MED ORDER — WHITE PETROLATUM GEL
Status: AC
  Administered 2012-03-28: 18:00:00
  Filled 2012-03-28: qty 5

## 2012-03-28 NOTE — Progress Notes (Signed)
Patient ID: DEMONTEZ NOVACK, male   DOB: 11/11/1925, 78 y.o.   MRN: [id number]  Sleeping fairly well. No new complaints. A 12 point review of systems has been performed and if not noted above is otherwise negative.  BP Readings from Last 3 Encounters:  03/28/12 150/78  03/14/12 117/52  02/08/12 142/80   Objective: Vital Signs: Blood pressure 150/78, pulse 73, temperature 98 F (36.7 C), temperature source Oral, resp. rate 18, weight 74.9 kg (165 lb 2 oz), SpO2 98.00%. No results found. No results found for this basename: WBC:2,HGB:2,HCT:2,PLT:2 in the last 72 hours No results found for this basename: NA:2,K:2,CL:2,CO2:2,GLUCOSE:2,BUN:2,CREATININE:2,CALCIUM:2 in the last 72 hours CBG (last 3)   Basename 03/25/12 1328  GLUCAP 91    Wt Readings from Last 3 Encounters:  03/28/12 74.9 kg (165 lb 2 oz)  03/14/12 76 kg (167 lb 8.8 oz)  02/08/12 75.751 kg (167 lb)    Physical Exam:  Constitutional: He appears well-developed and well-nourished.  HENT:  Head: Normocephalic.  Resolving bruises and hematomas over right face and eye Eyes: Pupils are equal, round, and reactive to light.  Right scleral hemorrhage. Wound over right eye healing. Sutures in tact Neck: Normal range of motion.  Cardiovascular: Normal rate and regular rhythm.  Murmur heard.  Pulmonary/Chest: Effort normal and breath sounds normal.  Abdominal: Soft. Bowel sounds are normal.  Musculoskeletal: He exhibits no edema.  RUE with diffuse ecchymosis at shoulder and biceps area. Right hip/thigh with evidence of hematoma and tenderness mid-thigh. Diffuse ecchymosis right flank to right thigh area. Pain at right scaphoid, with decreased grip. Right shoulder tender with AROM and PROM Neurological: He is alert.  [name] Awake and alert. better attention..  [name] Limited insight. poor day to day memory. Still doesn't remember my name and poor day to day memory noted. Skin: Skin is warm and  dry.     Assessment/Plan: 1. Functional deficits secondary to traumatic brain injury which require 3+ hours per day of interdisciplinary therapy in a comprehensive inpatient rehab setting. Physiatrist is providing close team supervision and 24 hour management of active medical problems listed below. Physiatrist and rehab team continue to assess barriers to discharge/monitor patient progress toward functional and medical goals.  Pt making slow but steady progress overall. Family educated and updated nearly daily.    FIM: FIM - Bathing Bathing Steps Patient Completed: Chest;Right Arm;Left Arm;Abdomen;Front perineal area;Right upper leg;Left upper leg Bathing: 3: Mod-Patient completes 5-7 29f 10 parts or 50-74%  FIM - Upper Body Dressing/Undressing Upper body dressing/undressing steps patient completed: Thread/unthread right sleeve of pullover shirt/dresss;Thread/unthread left sleeve of pullover shirt/dress Upper body dressing/undressing: 3: Mod-Patient completed 50-74% of tasks FIM - Lower Body Dressing/Undressing Lower body dressing/undressing steps patient completed: Thread/unthread left pants leg;Pull pants up/down;Don/Doff right shoe;Don/Doff left shoe;Fasten/unfasten right shoe;Fasten/unfasten left shoe Lower body dressing/undressing: 3: Mod-Patient completed 50-74% of tasks  FIM - Toileting Toileting steps completed by patient: Performs perineal hygiene Toileting Assistive Devices: Grab bar or rail for support Toileting: 2: Max-Patient completed 1 of 3 steps  FIM - Diplomatic Services operational officer Devices: Grab bars;Walker Toilet Transfers: 3-From toilet/BSC: Mod A (lift or lower assist)  FIM - Banker Devices: Therapist, occupational: 3: Chair or W/C > Bed: Mod A (lift or lower assist);3: Bed > Chair or W/C: Mod A (lift or lower assist);3: Sit > Supine: Mod A (lifting assist/Pt. 50-74%/lift 2 legs);3: Supine > Sit: Mod A  (lifting assist/Pt. 50-74%/lift 2  legs  FIM - Locomotion: Wheelchair Distance: 100 Locomotion: Wheelchair: 2: Travels 50 - 149 ft with maximal assistance (Pt: 25 - 49%) FIM - Locomotion: Ambulation Locomotion: Ambulation Assistive Devices: Designer, industrial/product Ambulation/Gait Assistance: 3: Mod assist Locomotion: Ambulation: 1: Travels less than 50 ft with moderate assistance (Pt: 50 - 74%)  Comprehension Comprehension Mode: Auditory Comprehension: 1-Understands basic less than 25% of the time/requires cueing 75% of the time  Expression Expression Mode: Verbal Expression: 1-Expresses basis less than 25% of the time/requires cueing greater than 75% of the time.  Social Interaction Social Interaction: 2-Interacts appropriately 25 - 49% of time - Needs frequent redirection.  Problem Solving Problem Solving: 1-Solves basic less than 25% of the time - needs direction nearly all the time or does not effectively solve problems and may need a restraint for safety  Memory Memory: 1-Recognizes or recalls less than 25% of the time/requires cueing greater than 75% of the time Medical Problem List and Plan:  1. DVT Prophylaxis/Anticoagulation: Mechanical: Sequential compression devices, below knee Bilateral lower extremities  2. Pain Management: denies any pain at current time. Wife reports arthritis "all over"  3. Mood: Monitor for now. Effexor resumed. LCSW to follow up for formal evaluation.  4. Neuropsych: This patient is not capable of making decisions on his/her own behalf.  5. CAD: Low dose Ace resumed.  6. Atrial Fibrillation: Off blood thinner due to ICH. Monitor HR with bid checks.  7. Chronic anemia:hgb back to 9.0 after transfusion. He his baseline hgb is around 9.0 8. OSA: Continue CPAP with one liter O2 whenever sleeping.  9. H/O Gastrectomy with gastroparesis: Small meals. Will add snacks to help with intake.  10. Lethargy:sleeping better, stamina improving. Not very anxious to  start a stimulant here given his multiple medical issues.  -repeat CT was done showing resolution of blood  -i have educated family regarding his BI and potential recovery given his age and injury  -work up has been negative for any other cause    11. BPH: continue Proscar daily 12. Right wrist: xray is supsicious for pseudogout, and ?scaphulolunate injury. No MRI due PPM. Manage conservatively  -voltaren gel 13. Wounds: local care 14. FEN-    push po-   -resumed home lasix  LOS (Days) 14 A FACE TO FACE EVALUATION WAS PERFORMED  Essynce Munsch T 03/28/2012, 7:14 AM

## 2012-03-28 NOTE — Progress Notes (Signed)
Physical Therapy Note  Patient Details  Name: ULYSESS WITZ MRN: [id number] Date of Birth: 1926/05/21 Today's Date: 03/28/2012  Time: 1000-1056 56 minutes Co-tx with TR  No c/o pain.  Transfer training to various types of chairs, sofa with min-mod A for sturdy chairs with armrests, max A from softer chairs.  Pt's wife educated on good ideas for seating arrangements at home.  Short distance gait in home and controlled environments with min A for controlled, mod-max A for home environment on carpet and with obstacle negotiation.  Ramp/curb training with RW, mod-max A due to decreased balance during ramp negotiation.  Pt's wife states she plans to "have help" whenever she takes him out of the house.  Individual therapy   DONAWERTH,KAREN 03/28/2012, 10:57 AM

## 2012-03-28 NOTE — Progress Notes (Signed)
Pt has home unit CPAP machine at bedside, family member present and pt already wearing and in bed for the night. RT will assist as needed.

## 2012-03-28 NOTE — Progress Notes (Addendum)
Speech Language Pathology Daily Session Notes  Patient Details  Name: Eric [name] MRN: [id number] Date of Birth: 07/26/1925  Today's Date: 03/28/2012  Session 1 Time: 0800-0900 Time Calculation (min): 60 min  Session 2 Time: 1330-1330 Time Calculation: 30 mins  Short Term Goals: Week 2: SLP Short Term Goal 1 (Week 2): Pt will consume thin liquids via straw without overt s/s of aspiraiton with Mod I.  SLP Short Term Goal 2 (Week 2): Pt will sustain attention to a functional task for 5 minutes with Max A multimodal cueing SLP Short Term Goal 3 (Week 2): Pt will identify 1 physical and 1 cognitive task with Max A multimodal cueing SLP Short Term Goal 4 (Week 2): Pt will express wants/needs with Max A mutlimodal cueing SLP Short Term Goal 5 (Week 2): Pt will orient to place, time and situation with Max A multimodal cueing  Skilled Therapeutic Interventions: Treatment focus on cognitive goals. Pt initiated meal with Max A multimodal cueing and demonstrated problem solving for tray set up with Max hand over hand assist. Pt required Max verbal cues for sustained attention to meal and overall Mod A question cues for orientation to place and situation. Pt required Max question and semantic cues for verbal expression of wants/needs.    Session 2: Treatment focus on attention and initiation. Pt required total A multimodal cueing for sustained attention for matching two shapes from field of two with 0% accuracy. Pt also required Max A multimodal cueing for initiation with repetitive task (flipping cards).   FIM:  Comprehension Comprehension: 1-Understands basic less than 25% of the time/requires cueing 75% of the time Expression Expression Mode: Verbal Expression: 1-Expresses basis less than 25% of the time/requires cueing greater than 75% of the time. Social Interaction Social Interaction: 1-Interacts appropriately less than 25% of the time. May be withdrawn or combative. Problem  Solving Problem Solving: 1-Solves basic less than 25% of the time - needs direction nearly all the time or does not effectively solve problems and may need a restraint for safety Memory Memory: 1-Recognizes or recalls less than 25% of the time/requires cueing greater than 75% of the time  Pain Pain Assessment Pain Assessment: No/denies pain Pain Score: 0-No pain PAINAD (Pain Assessment in Advanced Dementia) Breathing: normal Negative Vocalization: none Facial Expression: smiling or inexpressive Body Language: relaxed Consolability: no need to console PAINAD Score: 0   Therapy/Group: Individual Therapy  Wolf Boulay 03/28/2012, 12:27 PM

## 2012-03-28 NOTE — Progress Notes (Signed)
Recreational Therapy Session Note  Patient Details  Name: Eric [name] MRN: [id number] Date of Birth: Oct 27, 1925 Today's Date: 03/28/2012 Time:  1000-1056 Pain: no c/o Skilled Therapeutic Interventions/Progress Updates: Practiced functional transfers with pt and wife in solarium, including low soft surfaces, tall wing back chairs, and dining room style chair.  Discussed options for adapting seating to accommodate pt including using blocks or risers to raise furniture, or folded blankets or quilts under couch/chair cushions.  Pt required min-mod assist for transfers.  Also discussed ambulation using RW on carpeted surfaces with wife.  Briefly discussed community reintegration with pt and wife.  Wife stating that she would have,  help when taking the pt out of the house to restaurants, community events, and back to work. Discussed potential barriers & ways to accommodate/negotiatefor obstacles, and how to identify if pt is becoming overwhelmed.  Wife stated understanding.  Therapy/Group: Co-Treatment Matheo Rathbone 03/28/2012, 4:08 PM

## 2012-03-28 NOTE — Progress Notes (Signed)
This note has been reviewed and this clinician agrees with information provided.  

## 2012-03-28 NOTE — Progress Notes (Signed)
Occupational Therapy Session Note  Patient Details  Name: Eric [name] MRN: [id number] Date of Birth: 1926/04/01  Today's Date: 03/28/2012 Time: 1100-1200 Time Calculation (min): 60 min  Short Term Goals: Week 2:  OT Short Term Goal 1 (Week 2): Pt will dress UB with minimal verbal cuing. OT Short Term Goal 2 (Week 2): Pt will dress LB Min A consistently. OT Short Term Goal 3 (Week 2): Pt will complete bathing tasks with Min cues for sequencing.  Skilled Therapeutic Interventions/Progress Updates:    Self care retraining with bathing at shower level, dressing, and grooming focusing on functional mobility with RW, sit<>stands, standing endurance, trunk control/endurance and attention. Pt ambulated to BR with Min-Mod A to advance walker and R LE and performed walk-in shower transfer. Completed bathing tasks with Min verbal cues; performed sit<>stands x4 to wash buttocks; however, pt unable to release grab bar with one hand to complete task. Performed dressing tasks at EOB and again attempted to use one hand to pull up pants without success due to pt unable to support self with only unilateral UE. Stood at sink for grooming tasks for ~3-4 min with RW; pt unable to back away from sink to return to chair and pt's wife had to bring w/c over to sit down.   Therapy Documentation Precautions:  Precautions Precautions: Fall Restrictions Weight Bearing Restrictions: No  Pain: Pain Assessment Pain Assessment: No/denies pain  See FIM for current functional status  Therapy/Group: Individual Therapy  Jackelyn Poling 03/28/2012, 4:11 PM

## 2012-03-28 NOTE — Progress Notes (Signed)
Occupational Therapy Session Note  Patient Details  Name: Eric [name] MRN: [id number] Date of Birth: 01-Mar-1926  Today's Date: 03/28/2012 Time: 1400-1445 Time Calculation (min): 45 min  Short Term Goals: Week 1:  OT Short Term Goal 1 (Week 1): Pt would bathe at shower level with mod cues for initiation OT Short Term Goal 1 - Progress (Week 1): Met OT Short Term Goal 2 (Week 1): Pt will perform UB dressing Mod A. OT Short Term Goal 2 - Progress (Week 1): Met OT Short Term Goal 3 (Week 1): Pt will perform sit <> stands Min A for clothing management. OT Short Term Goal 3 - Progress (Week 1): Progressing toward goal OT Short Term Goal 4 (Week 1): Pt will perform 2 of 3 tasks during toileting. OT Short Term Goal 4 - Progress (Week 1): Progressing toward goal OT Short Term Goal 5 (Week 1): Pt will maintain standing balance with Min A during functional activity for 2 min OT Short Term Goal 5 - Progress (Week 1): Progressing toward goal  Skilled Therapeutic Interventions/Progress Updates:    1:1 engaged in functional mobility and sit <>stands with home task of watering plants. With functional ambulation on carpet pt required min A to steer RW and max A to turn and transition to sink to fill watering can. Pt requiring total A with hand over hand assistance to fill can. Pt unable to turn and transition back to RW - when asked if he needed help "Nope I'm good" demonstrating poor intellectual awareness that he was sitting on therapist's leg and falling. Pt demonstrated poor working memory: standing up to water pants and then sit forgetting what he was doing. Pt continued to needed max to total A following one step directions even with repetitive task.  Therapy Documentation Precautions:  Precautions Precautions: Fall Restrictions Weight Bearing Restrictions: No Pain: Pain Assessment Pain Assessment: No/denies pain  See FIM for current functional status  Therapy/Group: Individual  Therapy  Roney Mans [facility] 03/28/2012, 3:45 PM

## 2012-03-29 ENCOUNTER — Inpatient Hospital Stay (HOSPITAL_COMMUNITY): Payer: Medicare Other | Admitting: Speech Pathology

## 2012-03-29 ENCOUNTER — Inpatient Hospital Stay (HOSPITAL_COMMUNITY): Payer: Medicare Other | Admitting: Physical Therapy

## 2012-03-29 ENCOUNTER — Inpatient Hospital Stay (HOSPITAL_COMMUNITY): Payer: Medicare Other | Admitting: Occupational Therapy

## 2012-03-29 ENCOUNTER — Encounter (HOSPITAL_COMMUNITY): Payer: Medicare Other | Admitting: Occupational Therapy

## 2012-03-29 DIAGNOSIS — S069X9A Unspecified intracranial injury with loss of consciousness of unspecified duration, initial encounter: Secondary | ICD-10-CM

## 2012-03-29 DIAGNOSIS — Z5189 Encounter for other specified aftercare: Secondary | ICD-10-CM

## 2012-03-29 DIAGNOSIS — W19XXXA Unspecified fall, initial encounter: Secondary | ICD-10-CM

## 2012-03-29 NOTE — Patient Care Conference (Signed)
Inpatient RehabilitationTeam Conference Note Date: 03/28/2012   Time: 2:50 PM    Patient Name: Eric [name]      Medical Record Number: [id number]  Date of Birth: 27-Feb-1926 Sex: Male         Room/Bed: 4002/4002-01 Payor Info: Payor: MEDICARE  Plan: MEDICARE PART A AND B  Product Type: *No Product type*     Admitting Diagnosis: ich,fall  Admit Date/Time:  03/14/2012  5:37 PM Admission Comments: No comment available   Primary Diagnosis:  ICH (intracerebral hemorrhage) Principal Problem: ICH (intracerebral hemorrhage)  Patient Active Problem List   Diagnosis Date Noted  . ICH (intracerebral hemorrhage) 03/08/2012  . Hematoma of frontal scalp 03/08/2012  . Pleural effusion on right 03/08/2012  . Gastritis 01/13/2012  . Internal hemorrhoids 01/13/2012  . Iron deficiency anemia, unspecified 01/05/2012  . OSA (obstructive sleep apnea) 09/29/2011  . Chronic diastolic heart failure 07/23/2011  . Recurrent small bowel obstructions 04/07/2011  . Pulmonary nodule 03/10/2011  . Long term (current) use of anticoagulants 03/01/2011  . Gastric stasis with food retention 02/17/2011  . Atrial fibrillation 03/19/2010  . MOBITZ II ATRIOVENTRICULAR BLOCK 02/03/2010  . PACEMAKER, PERMANENT 02/03/2010  . RBBB W/ LAFB 07/30/2009  . CAD, AUTOLOGOUS BYPASS GRAFT 03/06/2009  . OBSTRUCTIVE SLEEP APNEA 01/22/2008  . HYPERLIPIDEMIA 01/19/2008  . HYPERTENSION 01/19/2008  . CEREBROVASCULAR DISEASE 01/19/2008  . ABDOMINAL AORTIC ANEURYSM 01/19/2008  . DEGENERATIVE JOINT DISEASE 01/19/2008  . TRANSIENT ISCHEMIC ATTACKS, HX OF 01/19/2008  . BENIGN PROSTATIC HYPERTROPHY, HX OF 01/19/2008    Expected Discharge Date: Expected Discharge Date: 04/05/12  Team Members Present: Physician: Dr. Faith Rogue Social Worker Present: Amada Jupiter, LCSW Nurse Present: Daryll Brod, RN PT Present: Karolee Stamps, PT OT Present: Bretta Bang, OT;Patricia Mat Carne, OT SLP Present: Feliberto Gottron, SLP Other  (Discipline and Name): Tora Duck, PPS Coordinator     Current Status/Progress Goal Weekly Team Focus  Medical   no changes  continue with plan  see previous   Bowel/Bladder   occasional incontinent episodes requires timed toileting every 3 hours miralax every day lbm 10-21 [place]   min assist   up to bathroom for all toileting    Swallow/Nutrition/ Hydration             ADL's   S for UB, Mod for LB, Min for bathing, Total for toileting  Min to Mod A   attention, orientation, organization of ADL tasks, standing balance   Mobility   min-mod A  min A  family ed   Communication   Mod-Max A  Mod A  expression of wants/needs   Safety/Cognition/ Behavioral Observations  Max-total A  Mod-Max A  initiation, problem solving, initiaiton, attention    Pain   tylenol 650 mgpo at hs only family reports tylenol makes patient sleepy during the day voltaren gel scheduled   less than 3  monitor pain    Skin   right eye scabbing healing sutures out  scattered bruising to extremities and face and right eye  no new breakdown   educate family on skin care       *See Interdisciplinary Assessment and Plan and progress notes for long and short-term goals  Barriers to Discharge: see prior    Possible Resolutions to Barriers:  see prior    Discharge Planning/Teaching Needs:  home with wife and private duty assistance providing 24/7 care      Team Discussion:  Gradually improving and medically stable.  Do not feel pt needs to  stay until original d/c date of 04/05/12.  Do not anticipate much change and will continue to requrie 24/7 assistance which family is providing.  Revisions to Treatment Plan:  Recommend earlier d/c   Continued Need for Acute Rehabilitation Level of Care: The patient requires daily medical management by a physician with specialized training in physical medicine and rehabilitation for the following conditions: Daily direction of a multidisciplinary physical  rehabilitation program to ensure safe treatment while eliciting the highest outcome that is of practical value to the patient.: Yes Daily medical management of patient stability for increased activity during participation in an intensive rehabilitation regime.: Yes Daily analysis of laboratory values and/or radiology reports with any subsequent need for medication adjustment of medical intervention for : Pulmonary problems;Post surgical problems;Neurological problems;Other  Dylan Monforte 03/29/2012, 12:59 PM

## 2012-03-29 NOTE — Progress Notes (Signed)
Occupational Therapy Session Note  Patient Details  Name: NIRAJ KUDRNA MRN: [id number] Date of Birth: 02/05/1926  Today's Date: 03/29/2012 Time: 1100-1200 Time Calculation (min): 60 min  Short Term Goals: Week 2:  OT Short Term Goal 1 (Week 2): Pt will dress UB with minimal verbal cuing. OT Short Term Goal 2 (Week 2): Pt will dress LB Min A consistently. OT Short Term Goal 3 (Week 2): Pt will complete bathing tasks with Min cues for sequencing.  Skilled Therapeutic Interventions/Progress Updates:    Self care retraining with bathing, dressing and grooming tasks focusing on attention, ability to follow one-step commands, functional mobility with RW and transfers for ADL tasks. Pt required Max tactile cues to ambulate to BR with RW for toileting and bathing tasks. Total A tactile, verbal, gestural and demonstrative cues for follow through with commands and attention to routine tasks. Pt noted to demonstrate improvements with progression of bathing tasks and ability to wash different body parts with Min instructional cues. Mod A for sit<>stands and forward weight shift for balance; pt demonstrates LOB to back.    Therapy Documentation Precautions:  Precautions Precautions: Fall Restrictions Weight Bearing Restrictions: No Pain: Pain Assessment Pain Assessment: No/denies pain Faces Pain Scale: No hurt  See FIM for current functional status  Therapy/Group: Individual Therapy  Jackelyn Poling 03/29/2012, 12:14 PM

## 2012-03-29 NOTE — Progress Notes (Signed)
PT is already on CPAP machine and ready for bed. RT will continue to monitor.

## 2012-03-29 NOTE — Progress Notes (Signed)
Physical Therapy Weekly Progress Note  Patient Details  Name: Eric [name] MRN: [id number] Date of Birth: 25-Jan-1926  Today's Date: 03/29/2012  Patient has met 0 of 3 short term goals.  Pt continues to make slow physical and cognitive progress and abilities vary based on fatigue and time of day.  Pt currently between min-max A level for transfers and gait with RW short distances.  Pt limited by poor attention and awareness requiring max-total A for problem solving, sustained/selective attention and intellectual awareness which limits progress with safe functional mobility.  Patient will continue to benefit from skilled PT intervention to enhance overall performance with activity tolerance, balance, postural control, ability to compensate for deficits, attention and awareness.  Patient not progressing toward long term goals.  See goal revision..  Continue plan of care.  PT Short Term Goals Week 2:  PT Short Term Goal 1 (Week 2): Pt will consistently perform sit to stand transfers with min A PT Short Term Goal 1 - Progress (Week 2): Progressing toward goal PT Short Term Goal 2 (Week 2): Pt will gait with min A 50' in controlled environment PT Short Term Goal 2 - Progress (Week 2): Progressing toward goal PT Short Term Goal 3 (Week 2): Pt will attend to functional task in standing x 90 seconds with min A PT Short Term Goal 3 - Progress (Week 2): Progressing toward goal  Skilled Therapeutic Interventions/Progress Updates:  Ambulation/gait training;Community reintegration;DME/adaptive equipment instruction;Neuromuscular re-education;Therapeutic Exercise;UE/LE Strength taining/ROM;Splinting/orthotics;Pain management;Discharge planning;Balance/vestibular training;Cognitive remediation/compensation;Patient/family education;Stair training;UE/LE Coordination activities;Wheelchair propulsion/positioning;Therapeutic Activities;Functional mobility training   See FIM for current functional  status   Larua Collier 03/29/2012, 4:31 PM

## 2012-03-29 NOTE — Progress Notes (Signed)
Speech Language Pathology Daily Session Notes  Patient Details  Name: Eric [name] MRN: [id number] Date of Birth: [birth year]  Today's Date: 03/29/2012 Session 1 Time: 0800-0900 Time Calculation (min): 60 min  Session 2 Time: 1300-1345 Time Calculation: 45 minutes  Short Term Goals: Week 2: SLP Short Term Goal 1 (Week 2): Pt will consume thin liquids via straw without overt s/s of aspiraiton with Mod I.  SLP Short Term Goal 2 (Week 2): Pt will sustain attention to a functional task for 5 minutes with Max A multimodal cueing SLP Short Term Goal 3 (Week 2): Pt will identify 1 physical and 1 cognitive task with Max A multimodal cueing SLP Short Term Goal 4 (Week 2): Pt will express wants/needs with Max A mutlimodal cueing SLP Short Term Goal 5 (Week 2): Pt will orient to place, time and situation with Max A multimodal cueing  Skilled Therapeutic Interventions: Treatment focus on cognitive goals. Pt initiated meal with Mod A verbal cues and demonstrated problem solving for tray set up with total hand over hand assist. Pt required Max verbal cues for sustained attention to meal and required extra time to complete meal. Pt was also overall Mod A question cues for orientation to place and situation and required Max question and semantic cues for verbal expression of wants/needs.    Session 2: Treatment focus on attention, initiation and verbal expression. Pt required Mod verbal and visual cues to initiate task but sustained attention to task for ~1 minute without redirection. Pt read the word on the cards with 90% accuracy and answered basic questions about the information on the card with 60% accuracy.   FIM:  Comprehension Comprehension Mode: Auditory Comprehension: 1-Understands basic less than 25% of the time/requires cueing 75% of the time Expression Expression Mode: Verbal Expression: 1-Expresses basis less than 25% of the time/requires cueing greater than 75% of the time. Social  Interaction Social Interaction: 1-Interacts appropriately less than 25% of the time. May be withdrawn or combative. Problem Solving Problem Solving: 1-Solves basic less than 25% of the time - needs direction nearly all the time or does not effectively solve problems and may need a restraint for safety Memory Memory: 1-Recognizes or recalls less than 25% of the time/requires cueing greater than 75% of the time FIM - Eating Eating Activity: 5: Supervision/cues  Pain Pain Assessment Pain Assessment: No/denies pain Faces Pain Scale: No hurt  Therapy/Group: Individual Therapy  Vong Garringer 03/29/2012, 11:54 AM

## 2012-03-29 NOTE — Progress Notes (Signed)
Occupational Therapy Session Note  Patient Details  Name: Eric [name] MRN: [id number] Date of Birth: [birth year]  Today's Date: 03/29/2012 Time: 1400-1445 Time Calculation (min): 45 min  Short Term Goals: Week 2:  OT Short Term Goal 1 (Week 2): Pt will dress UB with minimal verbal cuing. OT Short Term Goal 2 (Week 2): Pt will dress LB Min A consistently. OT Short Term Goal 3 (Week 2): Pt will complete bathing tasks with Min cues for sequencing.  Skilled Therapeutic Interventions/Progress Updates:    Self care retraining with focus on bed mobility, functional transfers, toileting tasks and cognitive skills. Pt performed stand pivot transfer w/c<>bed in ADL apartment with Mod-Max A and for tactile cues to turn hips; sit to supine Mod A and Max A to straighten self in bed and Max A for supine to sit EOB. Pt noted to have increased lethargy this PM. Pt participated in sorting task using socks (familiar item) with Min verbal cues to match socks together and Total cues to sort by color. Pt verbalized need to use BR urgently, transferred w/c to toilet Min A; however, pt was incontinent on the way with no awareness.  Therapy Documentation Precautions:  Precautions Precautions: Fall Restrictions Weight Bearing Restrictions: No Pain: Pain Assessment Pain Assessment: No/denies pain  See FIM for current functional status  Therapy/Group: Individual Therapy  Jackelyn Poling 03/29/2012, 3:11 PM

## 2012-03-29 NOTE — Progress Notes (Signed)
Social Work Patient ID: Eric [name], male   DOB: 02/09/1926, 78 y.o.   MRN: [id number]  Met this morning with patient, wife, niece and privately hired caregiver [name]).  Reviewed team conference info and discussed recommendation for earlier d/c.  Family reports that they will have 24/7 care (private duty) in place to begin 10/28, therefore, this date set as d/c date.  Private caregiver plans to spend the day with therapists here on Friday to complete all education.  All agreed with plan to begin f/u therapy with [facility] with possible transition to OP at later date.  Continue to follow.  Amada Jupiter

## 2012-03-29 NOTE — Progress Notes (Signed)
Physical Therapy Note  Patient Details  Name: RIYANSH GERSTNER MRN: [id number] Date of Birth: 22-Feb-1926 Today's Date: 03/29/2012  Time: 1000-1054 54 minutes  No c/o pain.  Gait training focusing on turning and sitting safely. Pt required mod A for gait, max-total A for sitting safely.  When pt distracted he requires total A for sitting safely.  Pt with no awareness of safety or problem solving.  Standing horseshoe toss with max A for balance with 1 UE support due to severe posterior lean.  Nu step for LE strength and endurance with mod cuing to stay awake this morning.  Pt continues with decreased attention, arousal and requires total A for awareness.  Individual therapy   Skylin Kennerson 03/29/2012, 10:56 AM

## 2012-03-29 NOTE — Progress Notes (Signed)
Patient ID: Eric [name], male   DOB: 1926/04/02, 78 y.o.   MRN: [id number]  Sleeping fairly well. No new complaints. A 12 point review of systems has been performed and if not noted above is otherwise negative.  BP Readings from Last 3 Encounters:  03/29/12 177/82  03/14/12 117/52  02/08/12 142/80   Objective: Vital Signs: Blood pressure 177/82, pulse 52, temperature 97.7 F (36.5 C), temperature source Oral, resp. rate 17, weight 78.4 kg (172 lb 13.5 oz), SpO2 100.00%. No results found. No results found for this basename: WBC:2,HGB:2,HCT:2,PLT:2 in the last 72 hours No results found for this basename: NA:2,K:2,CL:2,CO2:2,GLUCOSE:2,BUN:2,CREATININE:2,CALCIUM:2 in the last 72 hours CBG (last 3)  No results found for this basename: GLUCAP:3 in the last 72 hours  Wt Readings from Last 3 Encounters:  03/29/12 78.4 kg (172 lb 13.5 oz)  03/14/12 76 kg (167 lb 8.8 oz)  02/08/12 75.751 kg (167 lb)    Physical Exam:  Constitutional: He appears well-developed and well-nourished.  HENT:  Head: Normocephalic.  Resolving bruises and hematomas over right face and eye Eyes: Pupils are equal, round, and reactive to light.  Right scleral hemorrhage. Wound over right eye healing. Sutures in tact Neck: Normal range of motion.  Cardiovascular: Normal rate and regular rhythm.  Murmur heard.  Pulmonary/Chest: Effort normal and breath sounds normal.  Abdominal: Soft. Bowel sounds are normal.  Musculoskeletal: He exhibits no edema.  RUE with diffuse ecchymosis at shoulder and biceps area. Right hip/thigh with evidence of hematoma and tenderness mid-thigh. Diffuse ecchymosis right flank to right thigh area. Pain at right scaphoid, with decreased grip. Right shoulder tender with AROM and PROM Neurological: He is alert.  [name] Awake and alert. better attention..  [name] Limited insight. poor day to day memory. Still doesn't remember my name and poor day to day  memory noted. Skin: Skin is warm and dry.     Assessment/Plan: 1. Functional deficits secondary to traumatic brain injury which require 3+ hours per day of interdisciplinary therapy in a comprehensive inpatient rehab setting. Physiatrist is providing close team supervision and 24 hour management of active medical problems listed below. Physiatrist and rehab team continue to assess barriers to discharge/monitor patient progress toward functional and medical goals.  Pt reaching a bit of a cognitive plateau at this point. Will discuss with family regarding LOS here.    FIM: FIM - Bathing Bathing Steps Patient Completed: Chest;Right Arm;Left Arm;Abdomen;Front perineal area;Right upper leg;Left upper leg;Right lower leg (including foot);Left lower leg (including foot) Bathing: 4: Min-Patient completes 8-9 28f 10 parts or 75+ percent  FIM - Upper Body Dressing/Undressing Upper body dressing/undressing steps patient completed: Thread/unthread right sleeve of pullover shirt/dresss;Thread/unthread left sleeve of pullover shirt/dress;Put head through opening of pull over shirt/dress;Pull shirt over trunk Upper body dressing/undressing: 5: Set-up assist to: Obtain clothing/put away FIM - Lower Body Dressing/Undressing Lower body dressing/undressing steps patient completed: Thread/unthread right underwear leg;Thread/unthread left underwear leg;Thread/unthread right pants leg;Thread/unthread left pants leg;Don/Doff right shoe;Don/Doff left shoe Lower body dressing/undressing: 3: Mod-Patient completed 50-74% of tasks  FIM - Toileting Toileting steps completed by patient: Performs perineal hygiene Toileting Assistive Devices: Grab bar or rail for support Toileting: 1: Total-Patient completed zero steps, helper did all 3  FIM - Diplomatic Services operational officer Devices: Grab bars;Walker Toilet Transfers: 3-From toilet/BSC: Mod A (lift or lower assist);3-To toilet/BSC: Mod A (lift or lower  assist)  FIM - Banker Devices: Manufacturing systems engineer Transfer: 3: Bed >  Chair or W/C: Mod A (lift or lower assist)  FIM - Locomotion: Wheelchair Distance: 100 Locomotion: Wheelchair: 2: Travels 50 - 149 ft with maximal assistance (Pt: 25 - 49%) FIM - Locomotion: Ambulation Locomotion: Ambulation Assistive Devices: Designer, industrial/product Ambulation/Gait Assistance: 3: Mod assist Locomotion: Ambulation: 2: Travels 50 - 149 ft with moderate assistance (Pt: 50 - 74%)  Comprehension Comprehension Mode: Auditory Comprehension: 1-Understands basic less than 25% of the time/requires cueing 75% of the time  Expression Expression Mode: Verbal Expression: 1-Expresses basis less than 25% of the time/requires cueing greater than 75% of the time.  Social Interaction Social Interaction: 1-Interacts appropriately less than 25% of the time. May be withdrawn or combative.  Problem Solving Problem Solving: 1-Solves basic less than 25% of the time - needs direction nearly all the time or does not effectively solve problems and may need a restraint for safety  Memory Memory: 1-Recognizes or recalls less than 25% of the time/requires cueing greater than 75% of the time Medical Problem List and Plan:  1. DVT Prophylaxis/Anticoagulation: Mechanical: Sequential compression devices, below knee Bilateral lower extremities  2. Pain Management: denies any pain at current time. Wife reports arthritis "all over"  3. Mood: Monitor for now. Effexor resumed. LCSW to follow up for formal evaluation.  4. Neuropsych: This patient is not capable of making decisions on his/her own behalf.  5. CAD: Low dose Ace resumed.  6. Atrial Fibrillation: Off blood thinner due to ICH. Monitor HR with bid checks.  7. Chronic anemia:hgb back to 9.0 after transfusion. He his baseline hgb is around 9.0 8. OSA: Continue CPAP with one liter O2 whenever sleeping.  9. H/O Gastrectomy with gastroparesis:  Small meals. Will add snacks to help with intake.  10. Lethargy:sleeping better, stamina improving. Not very anxious to start a stimulant here given his multiple medical issues.  -repeat CT was done showing resolution of blood  -i have educated family regarding his BI and potential recovery given his age and injury  -work up has been negative for any other cause    11. BPH: continue Proscar daily 12. Right wrist: xray is supsicious for pseudogout, and ?scaphulolunate injury. No MRI due PPM. Manage conservatively  -voltaren gel 13. Wounds: local care 14. FEN-    push po-   -resumed home lasix  LOS (Days) 15 A FACE TO FACE EVALUATION WAS PERFORMED  Dallis Darden T 03/29/2012, 7:11 AM

## 2012-03-29 NOTE — Progress Notes (Signed)
This note has been reviewed and this clinician agrees with information provided.  

## 2012-03-29 NOTE — Progress Notes (Signed)
Nutrition Follow-up  Intervention: Continue current interventions.  Assessment:   Intake remains adequate, consuming 75 -100% of meals. Per wife, pt is taking his Ensure Pudding as ordered (however noted in administration records pt is refusing.) Wife would like to continue pudding supplement. Confirms that his meal intake is great.  Diet Order:  Heart Healthy Supplement: Ensure Pudding PO TID  Meds: Scheduled Meds:   . acetaminophen  650 mg Oral TID WC & HS  . aspirin EC  325 mg Oral Daily  . diclofenac sodium  2 g Topical QID  . enalapril  5 mg Oral BID  . feeding supplement  1 Container Oral TID BM  . finasteride  5 mg Oral Daily  . furosemide  20 mg Intravenous Once  . furosemide  20 mg Oral Daily  . multivitamin with minerals  1 tablet Oral Daily  . pantoprazole  40 mg Oral Q1200  . polyethylene glycol  17 g Oral Daily  . simvastatin  20 mg Oral QPM  . venlafaxine XR  37.5 mg Oral Q breakfast  . white petrolatum       Continuous Infusions:  PRN Meds:.alum & mag hydroxide-simeth, bisacodyl, diphenhydrAMINE, guaiFENesin-dextromethorphan, neomycin-bacitracin-polymyxin, ondansetron (ZOFRAN) IV, ondansetron, traMADol, traZODone  Labs:  CMP     Component Value Date/Time   NA 139 03/24/2012 0630   K 4.0 03/24/2012 0630   CL 104 03/24/2012 0630   CO2 27 03/24/2012 0630   GLUCOSE 95 03/24/2012 0630   BUN 21 03/24/2012 0630   CREATININE 0.84 03/24/2012 0630   CALCIUM 8.9 03/24/2012 0630   PROT 6.4 03/15/2012 0530   ALBUMIN 3.1* 03/15/2012 0530   AST 21 03/15/2012 0530   ALT 20 03/15/2012 0530   ALKPHOS 89 03/15/2012 0530   BILITOT 1.3* 03/15/2012 0530   GFRNONAA 78* 03/24/2012 0630   GFRAA >90 03/24/2012 0630     Intake/Output Summary (Last 24 hours) at 03/29/12 1144 Last data filed at 03/29/12 0700  Gross per 24 hour  Intake    720 ml  Output      0 ml  Net    720 ml  BM 10/21  Weight Status:  172 lb - wt stable  Estimated needs:  1700 - 1900 kcal, 100 - 110 grams  protein  Nutrition Dx:  Increased nutrient needs r/t TBI AEB estimated needs.  Goal:  Pt to meet >/= 90% of their estimated nutrition needs - met  Monitor:  weight trends, lab trends, I/O's, PO intake, supplement tolerance  Jarold Motto MS, RD, LDN Pager: [phone] After-hours pager: [phone]

## 2012-03-30 ENCOUNTER — Inpatient Hospital Stay (HOSPITAL_COMMUNITY): Payer: Medicare Other | Admitting: Physical Therapy

## 2012-03-30 ENCOUNTER — Inpatient Hospital Stay (HOSPITAL_COMMUNITY): Payer: Medicare Other | Admitting: Speech Pathology

## 2012-03-30 ENCOUNTER — Inpatient Hospital Stay (HOSPITAL_COMMUNITY): Payer: Medicare Other

## 2012-03-30 LAB — BASIC METABOLIC PANEL
BUN: 23 mg/dL (ref 6–23)
CO2: 27 mEq/L (ref 19–32)
Chloride: 101 mEq/L (ref 96–112)
Creatinine, Ser: 0.84 mg/dL (ref 0.50–1.35)
Potassium: 4.4 mEq/L (ref 3.5–5.1)

## 2012-03-30 NOTE — Progress Notes (Signed)
Physical Therapy Session Note  Patient Details  Name: Eric [name] MRN: [id number] Date of Birth: Oct 01, 1925  Today's Date: 03/30/2012 Time: 4098-1191 Time Calculation (min): 46 min  Short Term Goals: Week 2:  PT Short Term Goal 1 (Week 2): Pt will consistently perform sit to stand transfers with min A PT Short Term Goal 1 - Progress (Week 2): Progressing toward goal PT Short Term Goal 2 (Week 2): Pt will gait with min A 50' in controlled environment PT Short Term Goal 2 - Progress (Week 2): Progressing toward goal PT Short Term Goal 3 (Week 2): Pt will attend to functional task in standing x 90 seconds with min A PT Short Term Goal 3 - Progress (Week 2): Progressing toward goal  Skilled Therapeutic Interventions/Progress Updates:    This session focused on gait with RW with WC to follow to encourage increased gait distance.  We also worked on dynamic standing balance, endurance and cognitive task while standing with one hand on RW hooking horse shoes on the b-ball goal.  See details below.    Therapy Documentation Precautions:  Precautions Precautions: Fall Restrictions Weight Bearing Restrictions: No General:     Pain: Pain Assessment Pain Assessment: No/denies pain Pain Score: 0-No pain Mobility: Transfers Sit to Stand: 4: Min assist;With upper extremity assist;With armrests;From chair/3-in-1 Sit to Stand Details (indicate cue type and reason): less cueing needed for hand placment! only 25% of treatment did he need cues to push up from chair.  Min assist to anteriorly weight shift trunk during transition.   Stand to Sit: 4: Min assist;With upper extremity assist;With armrests;To chair/3-in-1 Stand to Sit Details: min assist to help control descent to sit, even with verbal and manual cues for hand placement pt still holds onto RW during stand to sit.   Stand Pivot Transfers: 4: Min assist Stand Pivot Transfer Details (indicate cue type and reason): min assist with RW WC  to recliner chair.  Max verbal cues for leg sequencing and assist needed to support pt for balance and help steer RW.   Locomotion : Ambulation Ambulation: Yes Ambulation/Gait Assistance: 4: Min assist Ambulation Distance (Feet): 100 Feet Assistive device: Rolling walker Ambulation/Gait Assistance Details: verbal cues for upright posture, verbal cues for big steps, WC to follow for one seated rest break during gait to gym.       Balance: Dynamic Standing Balance Dynamic Standing - Level of Assistance: 4: Min assist Other Treatments: Treatments Therapeutic Activity: Stood with RW at b-ball goal hooking horse shoes on b-ball goal and retrieving them hanging them back on RW.  This activity worked on upright posture, reaching with one hand support on RW (balance), and standing endurance/leg strength.  Multiple repetitions performed.  This was also a cognitive task of following multistep directions in a moderately distacting environment.    See FIM for current functional status  Therapy/Group: Individual Therapy  Lurena Joiner B. Yulian Gosney, PT, DPT [phone]   03/30/2012, 5:10 PM

## 2012-03-30 NOTE — Progress Notes (Signed)
Patient ID: Eric [name], male   DOB: July 13, 1925, 78 y.o.   MRN: [id number]   A 12 point review of systems has been performed and if not noted above is otherwise negative.  BP Readings from Last 3 Encounters:  03/30/12 175/95  03/14/12 117/52  02/08/12 142/80   Objective: Vital Signs: Blood pressure 175/95, pulse 60, temperature 97.9 F (36.6 C), temperature source Oral, resp. rate 17, weight 77.2 kg (170 lb 3.1 oz), SpO2 98.00%. No results found. No results found for this basename: WBC:2,HGB:2,HCT:2,PLT:2 in the last 72 hours No results found for this basename: NA:2,K:2,CL:2,CO2:2,GLUCOSE:2,BUN:2,CREATININE:2,CALCIUM:2 in the last 72 hours CBG (last 3)  No results found for this basename: GLUCAP:3 in the last 72 hours  Wt Readings from Last 3 Encounters:  03/30/12 77.2 kg (170 lb 3.1 oz)  03/14/12 76 kg (167 lb 8.8 oz)  02/08/12 75.751 kg (167 lb)    Physical Exam:  Constitutional: He appears well-developed and well-nourished.  HENT:  Head: Normocephalic.  Resolving bruises and hematomas over right face and eye Eyes: Pupils are equal, round, and reactive to light.  Right scleral hemorrhage. Wound over right eye healing. Sutures in tact Neck: Normal range of motion.  Cardiovascular: Normal rate and regular rhythm.  Murmur heard.  Pulmonary/Chest: Effort normal and breath sounds normal.  Abdominal: Soft. Bowel sounds are normal.  Musculoskeletal: He exhibits no edema.  RUE with diffuse ecchymosis at shoulder and biceps area. Right hip/thigh with evidence of hematoma and tenderness mid-thigh. Diffuse ecchymosis right flank to right thigh area. Pain at right scaphoid, with decreased grip. Right shoulder tender with AROM and PROM Neurological: He is alert.  [name] Awake and alert. better attention..  [name] Limited insight. poor day to day memory. Still doesn't remember my name and poor day to day memory noted. Skin: Skin is warm and dry.      Assessment/Plan: 1. Functional deficits secondary to traumatic brain injury which require 3+ hours per day of interdisciplinary therapy in a comprehensive inpatient rehab setting. Physiatrist is providing close team supervision and 24 hour management of active medical problems listed below. Physiatrist and rehab team continue to assess barriers to discharge/monitor patient progress toward functional and medical goals.  Spoke with wife yesterday about prognosis, discharge date and she understands and is in agreement    FIM: FIM - Bathing Bathing Steps Patient Completed: Chest;Right Arm;Left Arm;Abdomen;Front perineal area;Right upper leg;Left upper leg;Right lower leg (including foot);Left lower leg (including foot) Bathing: 4: Min-Patient completes 8-9 65f 10 parts or 75+ percent  FIM - Upper Body Dressing/Undressing Upper body dressing/undressing steps patient completed: Thread/unthread right sleeve of pullover shirt/dresss;Thread/unthread left sleeve of pullover shirt/dress;Put head through opening of pull over shirt/dress;Pull shirt over trunk Upper body dressing/undressing: 5: Set-up assist to: Obtain clothing/put away FIM - Lower Body Dressing/Undressing Lower body dressing/undressing steps patient completed: Thread/unthread right pants leg;Thread/unthread left pants leg Lower body dressing/undressing: 3: Mod-Patient completed 50-74% of tasks  FIM - Toileting Toileting steps completed by patient: Performs perineal hygiene Toileting Assistive Devices: Grab bar or rail for support Toileting: 1: Total-Patient completed zero steps, helper did all 3  FIM - Diplomatic Services operational officer Devices: Grab bars;Walker Toilet Transfers: 3-From toilet/BSC: Mod A (lift or lower assist);3-To toilet/BSC: Mod A (lift or lower assist)  FIM - Bed/Chair Transfer Bed/Chair Transfer Assistive Devices: Therapist, occupational: 3: Bed > Chair or W/C: Mod A (lift or lower  assist)  FIM - Locomotion: Wheelchair Distance: 100 Locomotion:  Wheelchair: 2: Travels 50 - 149 ft with maximal assistance (Pt: 25 - 49%) FIM - Locomotion: Ambulation Locomotion: Ambulation Assistive Devices: Designer, industrial/product Ambulation/Gait Assistance: 3: Mod assist Locomotion: Ambulation: 1: Travels less than 50 ft with moderate assistance (Pt: 50 - 74%)  Comprehension Comprehension Mode: Auditory Comprehension: 1-Understands basic less than 25% of the time/requires cueing 75% of the time  Expression Expression Mode: Verbal Expression: 1-Expresses basis less than 25% of the time/requires cueing greater than 75% of the time.  Social Interaction Social Interaction: 1-Interacts appropriately less than 25% of the time. May be withdrawn or combative.  Problem Solving Problem Solving: 1-Solves basic less than 25% of the time - needs direction nearly all the time or does not effectively solve problems and may need a restraint for safety  Memory Memory: 1-Recognizes or recalls less than 25% of the time/requires cueing greater than 75% of the time Medical Problem List and Plan:  1. DVT Prophylaxis/Anticoagulation: Mechanical: Sequential compression devices, below knee Bilateral lower extremities  2. Pain Management: denies any pain at current time. Wife reports arthritis "all over"  3. Mood: Monitor for now. Effexor resumed. LCSW to follow up for formal evaluation.  4. Neuropsych: This patient is not capable of making decisions on his/her own behalf.  5. CAD: Low dose Ace resumed. bp has been generally well controlled 6. Atrial Fibrillation: Off blood thinner due to ICH. Monitor HR with bid checks.  7. Chronic anemia:hgb back to 9.0 after transfusion. He his baseline hgb is around 9.0 8. OSA: Continue CPAP with one liter O2 whenever sleeping.  9. H/O Gastrectomy with gastroparesis: Small meals. Will add snacks to help with intake.  10. Lethargy:sleeping better, stamina improving. Not  very anxious to start a stimulant here given his multiple medical issues.  -repeat CT was done showing resolution of blood  -i have educated family regarding his BI and potential recovery given his age and injury  -work up has been negative for any other cause    11. BPH: continue Proscar daily 12. Right wrist: xray is supsicious for pseudogout, and ?scaphulolunate injury. No MRI due PPM. Manage conservatively  -voltaren gel 13. Wounds: local care 14. FEN-    push po-   -resumed home lasix  -check bmet today  LOS (Days) 16 A FACE TO FACE EVALUATION WAS PERFORMED  SWARTZ,ZACHARY T 03/30/2012, 7:15 AM

## 2012-03-30 NOTE — Progress Notes (Signed)
Occupational Therapy Session Note  Patient Details  Name: Eric [name] MRN: [id number] Date of Birth: [birth year]  Today's Date: 03/30/2012 Time: 1100-1155 Time Calculation (min): 55 min  Short Term Goals: Week 2:  OT Short Term Goal 1 (Week 2): Pt will dress UB with minimal verbal cuing. OT Short Term Goal 2 (Week 2): Pt will dress LB Min A consistently. OT Short Term Goal 3 (Week 2): Pt will complete bathing tasks with Min cues for sequencing.  Skilled Therapeutic Interventions/Progress Updates:    Pt engaged in ADL retraining including bathing at shower level and dressing with sit<>stand from w/c.  Focus on task initiation, sequencing, decreased perseveration, functional ambulation for transfers, and safety awareness.  Pt continues to require max verbal cues for initiation with bathing tasks and min verbal cues for sequencing.  Pt perseverated on bathing bilateral upper legs during bathing.  Pt initiated all dressing tasks when presented with clothing items.  Pt continues to requires max verbal and tactile cues when ambulating with RW.  Therapy Documentation Precautions:  Precautions Precautions: Fall Restrictions Weight Bearing Restrictions: No  Pain: Pain Assessment Pain Assessment: No/denies pain Pain Score: 0-No pain PAINAD (Pain Assessment in Advanced Dementia) Breathing: normal Negative Vocalization: none Facial Expression: smiling or inexpressive Body Language: relaxed Consolability: no need to console PAINAD Score: 0   See FIM for current functional status  Therapy/Group: Individual Therapy  Rich Brave 03/30/2012, 11:58 AM

## 2012-03-30 NOTE — Progress Notes (Signed)
Speech Language Pathology Daily Session Notes  Patient Details  Name: Eric [name] MRN: [id number] Date of Birth: May 10, 1926  Today's Date: 03/30/2012  Session 1 Time: 0800-0900  Time Calculation: 60 minutes   Session 2 Time: 4098-1191 Time Calculation (min): 45 min  Short Term Goals: Week 2: SLP Short Term Goal 1 (Week 2): Pt will consume thin liquids via straw without overt s/s of aspiraiton with Mod I.  SLP Short Term Goal 2 (Week 2): Pt will sustain attention to a functional task for 5 minutes with Max A multimodal cueing SLP Short Term Goal 3 (Week 2): Pt will identify 1 physical and 1 cognitive task with Max A multimodal cueing SLP Short Term Goal 4 (Week 2): Pt will express wants/needs with Max A mutlimodal cueing SLP Short Term Goal 5 (Week 2): Pt will orient to place, time and situation with Max A multimodal cueing  Skilled Therapeutic Interventions: Treatment focus on cognitive goals. Pt initiated meal with Mod A verbal cues and demonstrated problem solving for tray set up with total hand over hand assist. Pt required Mod verbal cues for sustained attention to meal and was also overall Mod A question cues for orientation to place and situation and required Max question and semantic cues for verbal expression of wants/needs. Initiated education with hired caregiver in regards to current cognitive function and strategies to utilize at home to increase initiation, attention, etc.    Session 2: Treatment focus on family education with pt's wife in regards to current cognitive function and strategies to utilize at home to increase activity tolerance, orientation, carryover, attention and problem solving. Pt's wife verbalized understanding and verbalized no questions at this time.   FIM:  Comprehension Comprehension Mode: Auditory Comprehension: 2-Understands basic 25 - 49% of the time/requires cueing 51 - 75% of the time Expression Expression Mode: Verbal Expression:  2-Expresses basic 25 - 49% of the time/requires cueing 50 - 75% of the time. Uses single words/gestures. Social Interaction Social Interaction: 2-Interacts appropriately 25 - 49% of time - Needs frequent redirection. Problem Solving Problem Solving: 1-Solves basic less than 25% of the time - needs direction nearly all the time or does not effectively solve problems and may need a restraint for safety Memory Memory: 1-Recognizes or recalls less than 25% of the time/requires cueing greater than 75% of the time  Pain Pain Assessment Pain Assessment: No/denies pain  Therapy/Group: Individual Therapy  Tish Begin 03/30/2012, 4:43 PM

## 2012-03-30 NOTE — Progress Notes (Signed)
Physical Therapy Note  Patient Details  Name: COLBY ROGAN MRN: [id number] Date of Birth: 05-06-1926 Today's Date: 03/30/2012  Time: 1000-1056 56 minutes  No c/o pain.  Car transfers SPT with RW with mod A, total A with sequencing and safety.  Short distance gait with focus on turning to sit safely, pt requires mod A for turning, max-total cuing throughout for sequencing.  Bathroom mobility with RW mod A, mod A for standing balance for hygiene, toileting.  Stair training 2 x 5 stairs with min A with B handrails.  Max cuing for attention to task in min-mod distracting environment.  Individual therapy   Shalae Belmonte 03/30/2012, 10:57 AM

## 2012-03-31 ENCOUNTER — Inpatient Hospital Stay (HOSPITAL_COMMUNITY): Payer: Medicare Other | Admitting: Speech Pathology

## 2012-03-31 ENCOUNTER — Encounter (HOSPITAL_COMMUNITY): Payer: Medicare Other

## 2012-03-31 ENCOUNTER — Inpatient Hospital Stay (HOSPITAL_COMMUNITY): Payer: Medicare Other | Admitting: Physical Therapy

## 2012-03-31 DIAGNOSIS — S069X9A Unspecified intracranial injury with loss of consciousness of unspecified duration, initial encounter: Secondary | ICD-10-CM

## 2012-03-31 DIAGNOSIS — W19XXXA Unspecified fall, initial encounter: Secondary | ICD-10-CM

## 2012-03-31 NOTE — Progress Notes (Signed)
Physical Therapy Session Note  Patient Details  Name: Eric [name] MRN: [id number] Date of Birth: 02-Jun-1926  Today's Date: 03/31/2012 Time: 1005-1058 Time Calculation (min): 53 min  Short Term Goals: Week 2:  PT Short Term Goal 1 (Week 2): Pt will consistently perform sit to stand transfers with min A PT Short Term Goal 1 - Progress (Week 2): Progressing toward goal PT Short Term Goal 2 (Week 2): Pt will gait with min A 50' in controlled environment PT Short Term Goal 2 - Progress (Week 2): Progressing toward goal PT Short Term Goal 3 (Week 2): Pt will attend to functional task in standing x 90 seconds with min A PT Short Term Goal 3 - Progress (Week 2): Progressing toward goal  Skilled Therapeutic Interventions/Progress Updates:    This session focused on education of the patient's stay at home caregiver, [name].  [name] participated fully in the session taking lead on helping the patient with all aspects of stairs, car transfer and gait after therapist demonstrated correct technique.  See details below.    Therapy Documentation Precautions:  Precautions Precautions: Fall Restrictions Weight Bearing Restrictions: No   Mobility: Transfers Sit to Stand: 4: Min assist;With upper extremity assist;With armrests;From chair/3-in-1 Sit to Stand Details (indicate cue type and reason): cues only with car transfer to reach back to push up from sitting surface.  Pt needed min assist during transitions to anteriorly weight shift and keep balance as he is putting his hands up to the RW.   Stand to Sit: 4: Min assist;With upper extremity assist;With armrests;To chair/3-in-1 Stand to Sit Details: min assist to help control descent to sit and manual and verbal cues to reach back to sitting surface.   Stand Pivot Transfers: 4: Min Actuary Details (indicate cue type and reason): min assist to support pt for balance, min assist to help steer Rw and give cues to turn all the way  around to sitting surface (the patient likes to sit prematurely).   Locomotion : Ambulation Ambulation: Yes Ambulation/Gait Assistance: 4: Min assist Ambulation Distance (Feet): 90 Feet Assistive device: Rolling walker Ambulation/Gait Assistance Details: verbal cues to pick up his feet and take big steps, stay close to RW and for upright posture.   Stairs / Additional Locomotion Stairs: Yes Stairs Assistance: 4: Min assist Stairs Assistance Details (indicate cue type and reason): min assist to support trunk for balance while going up and down stairs.  Practiced x1 with both rails and x1 with one rail and hand held assist.   Stair Management Technique: One rail Right;Two rails;Alternating pattern;Forwards Number of Stairs: 10  Height of Stairs: 6  (4-6") Wheelchair Mobility Distance: 150     Other Treatments: Treatments Therapeutic Activity: Car transfers with min assist reviewed x2 once with PT demonstrating and once with caregiver demonstrating safet technique.  Min assist with RW with verbal cues for correct technique and sequencing. Had to cue pt several times not to pull on door to get up.    See FIM for current functional status  Therapy/Group: Individual Therapy  Lurena Joiner B. Grey Schlauch, PT, DPT [phone]   03/31/2012, 4:48 PM

## 2012-03-31 NOTE — Progress Notes (Signed)
Physical Therapy Session Note  Patient Details  Name: Eric [name] MRN: [id number] Date of Birth: 05/12/1926  Today's Date: 03/31/2012 Time: 4782-9562 Time Calculation (min): 47 min  Short Term Goals: Week 2:  PT Short Term Goal 1 (Week 2): Pt will consistently perform sit to stand transfers with min A PT Short Term Goal 1 - Progress (Week 2): Progressing toward goal PT Short Term Goal 2 (Week 2): Pt will gait with min A 50' in controlled environment PT Short Term Goal 2 - Progress (Week 2): Progressing toward goal PT Short Term Goal 3 (Week 2): Pt will attend to functional task in standing x 90 seconds with min A PT Short Term Goal 3 - Progress (Week 2): Progressing toward goal  Skilled Therapeutic Interventions/Progress Updates:    Pt's caregiver, [name], present for education.  Caregiver performed all aspects of mobility including transfers, ambulation with RW and stair instruction.  Caregiver provided all necessary and safe verbal cues as well as physical assist with patient.  Pt becomes very distracted in gym/hall and needs more frequent cues and redirection.  Pt attempts to sit before close enough to chair.  Gait shuffled and flexed at trunk, hips and knees.  Therapy Documentation Precautions:  Precautions Precautions: Fall Restrictions Weight Bearing Restrictions: No    Pain: Pain Assessment Pain Assessment: No/denies pain  See FIM for current functional status  Therapy/Group: Individual Therapy  Newell Coral 03/31/2012, 3:28 PM

## 2012-03-31 NOTE — Progress Notes (Signed)
Occupational Therapy Session Note  Patient Details  Name: Eric [name] MRN: [id number] Date of Birth: November 16, 1925  Today's Date: 03/31/2012 Time: 1100-1155 Time Calculation (min): 55 min  Short Term Goals: Week 2:  OT Short Term Goal 1 (Week 2): Pt will dress UB with minimal verbal cuing. OT Short Term Goal 2 (Week 2): Pt will dress LB Min A consistently. OT Short Term Goal 3 (Week 2): Pt will complete bathing tasks with Min cues for sequencing.  Skilled Therapeutic Interventions/Progress Updates:    Pt resting in recliner with caregiver [name]) at side.  [name] assisted PRN/observed throughout session and demonstrated appropriate and safe assistance/supervision throughout.  Pt engaged in bathing and dressing tasks with focus on task initiation, sequencing, task termination, sit<>stand, funcitonal ambulation for transfers, and safety awareness.  Pt continues to require min-max verbal cues for initiation and sequencing.  Pt not oriented to place or incident this morning.    Therapy Documentation Precautions:  Precautions Precautions: Fall Restrictions Weight Bearing Restrictions: No   Pain: Pain Assessment Pain Assessment: No/denies pain Pain Score: 0-No pain  See FIM for current functional status  Therapy/Group: Individual Therapy  Rich Brave 03/31/2012, 12:02 PM

## 2012-03-31 NOTE — Progress Notes (Signed)
Patient ID: Eric [name], male   DOB: 08/01/1925, 78 y.o.   MRN: [id number]  Eric [name] is a 78 y.o. male With history of A Fib on xarelto who sustained a ground level fall and struck his head against the curb on 03/08/12. Taken to [facility] where xrays done revealed R-frontal ICH. He was transferred to [facility] and evaluated by Dr. Wynetta Emery who recommended monitoring MS and serial CCT. MS has been stable. Last CT with stable right frontal hemorrhagic contusions. Right shoulder CT with suspicion of large retracted RTC tear with advanced degenerative changes as well as incidental note of large R-pleural effusion and small pulmonary nodules. Pulmonary feels patient with recurrent effusion likely due to heart failure  A 12 point review of systems has been performed and if not noted above is otherwise negative.  BP Readings from Last 3 Encounters:  03/31/12 150/75  03/14/12 117/52  02/08/12 142/80   Objective: Vital Signs: Blood pressure 150/75, pulse 60, temperature 98 F (36.7 C), temperature source Axillary, resp. rate 16, weight 78.1 kg (172 lb 2.9 oz), SpO2 100.00%. No results found. No results found for this basename: WBC:2,HGB:2,HCT:2,PLT:2 in the last 72 hours  Basename 03/30/12 0918  NA 137  K 4.4  CL 101  CO2 27  GLUCOSE 191*  BUN 23  CREATININE 0.84  CALCIUM 9.3   CBG (last 3)  No results found for this basename: GLUCAP:3 in the last 72 hours  Wt Readings from Last 3 Encounters:  03/31/12 78.1 kg (172 lb 2.9 oz)  03/14/12 76 kg (167 lb 8.8 oz)  02/08/12 75.751 kg (167 lb)    Physical Exam:  Constitutional: He appears well-developed and well-nourished.  HENT:  Head: Normocephalic.  Resolving bruises and hematomas over right face and eye Eyes: Pupils are equal, round, and reactive to light.  Right scleral hemorrhage. Wound over right eye healing. Sutures in tact Neck: Normal range of motion.  Cardiovascular: Normal rate and regular rhythm.  Murmur heard.  Pulmonary/Chest:  Effort normal and breath sounds normal.  Abdominal: Soft. Bowel sounds are normal.  Musculoskeletal: He exhibits no edema.  RUE with diffuse ecchymosis at shoulder and biceps area. Right hip/thigh with evidence of hematoma and tenderness mid-thigh. Diffuse ecchymosis right flank to right thigh area. Pain at right scaphoid, with decreased grip. Right shoulder tender with AROM and PROM Neurological: He is alert.  Just woke up..   Limited insight. poor day to day memory.  Difficulty following commandsSkin: Skin is warm and dry.     Assessment/Plan: 1. Functional deficits secondary to traumatic brain injury which require 3+ hours per day of interdisciplinary therapy in a comprehensive inpatient rehab setting. Physiatrist is providing close team supervision and 24 hour management of active medical problems listed below. Physiatrist and rehab team continue to assess barriers to discharge/monitor patient progress toward functional and medical goals.  Pt just waking up during exam, will monitor during the day, if remains lethargic further work up may be needed.  Recent repeat work up reveal no new medical issues complicating TBI    FIM: FIM - Bathing Bathing Steps Patient Completed: Chest;Right Arm;Left Arm;Abdomen;Front perineal area;Right upper leg;Left upper leg;Right lower leg (including foot);Left lower leg (including foot) Bathing: 4: Min-Patient completes 8-9 46f 10 parts or 75+ percent  FIM - Upper Body Dressing/Undressing Upper body dressing/undressing steps patient completed: Thread/unthread right sleeve of pullover shirt/dresss;Thread/unthread left sleeve of pullover shirt/dress;Put head through opening of pull over shirt/dress;Pull shirt over trunk Upper body dressing/undressing: 5: Set-up  assist to: Obtain clothing/put away FIM - Lower Body Dressing/Undressing Lower body dressing/undressing steps patient completed: Pull pants up/down;Don/Doff left sock;Don/Doff right shoe;Don/Doff left  shoe;Fasten/unfasten right shoe;Fasten/unfasten left shoe;Thread/unthread right pants leg Lower body dressing/undressing: 3: Mod-Patient completed 50-74% of tasks  FIM - Toileting Toileting steps completed by patient: Performs perineal hygiene Toileting Assistive Devices: Grab bar or rail for support Toileting: 1: Total-Patient completed zero steps, helper did all 3  FIM - Diplomatic Services operational officer Devices: Grab bars;Walker Toilet Transfers: 3-To toilet/BSC: Mod A (lift or lower assist);3-From toilet/BSC: Mod A (lift or lower assist)  FIM - Bed/Chair Transfer Bed/Chair Transfer Assistive Devices: Manufacturing systems engineer Transfer: 3: Supine > Sit: Mod A (lifting assist/Pt. 50-74%/lift 2 legs;3: Sit > Supine: Mod A (lifting assist/Pt. 50-74%/lift 2 legs);3: Bed > Chair or W/C: Mod A (lift or lower assist);3: Chair or W/C > Bed: Mod A (lift or lower assist)  FIM - Locomotion: Wheelchair Distance: 100 Locomotion: Wheelchair: 2: Travels 50 - 149 ft with maximal assistance (Pt: 25 - 49%) FIM - Locomotion: Ambulation Locomotion: Ambulation Assistive Devices: Designer, industrial/product Ambulation/Gait Assistance: 4: Min assist Locomotion: Ambulation: 1: Travels less than 50 ft with moderate assistance (Pt: 50 - 74%)  Comprehension Comprehension Mode: Auditory Comprehension: 2-Understands basic 25 - 49% of the time/requires cueing 51 - 75% of the time  Expression Expression Mode: Verbal Expression: 2-Expresses basic 25 - 49% of the time/requires cueing 50 - 75% of the time. Uses single words/gestures.  Social Interaction Social Interaction: 2-Interacts appropriately 25 - 49% of time - Needs frequent redirection.  Problem Solving Problem Solving: 1-Solves basic less than 25% of the time - needs direction nearly all the time or does not effectively solve problems and may need a restraint for safety  Memory Memory: 1-Recognizes or recalls less than 25% of the time/requires cueing greater  than 75% of the time Medical Problem List and Plan:  1. DVT Prophylaxis/Anticoagulation: Mechanical: Sequential compression devices, below knee Bilateral lower extremities  2. Pain Management: denies any pain at current time. Wife reports arthritis "all over"  3. Mood: Monitor for now. Effexor resumed. LCSW to follow up for formal evaluation.  4. Neuropsych: This patient is not capable of making decisions on his/her own behalf.  5. CAD: Low dose Ace resumed. bp has been generally well controlled 6. Atrial Fibrillation: Off blood thinner due to ICH. Monitor HR with bid checks.  7. Chronic anemia:hgb back to 9.0 after transfusion. He his baseline hgb is around 9.0 8. OSA: Continue CPAP with one liter O2 whenever sleeping.  9. H/O Gastrectomy with gastroparesis: Small meals. Will add snacks to help with intake.  10. Lethargy:sleeping better, stamina improving. Not very anxious to start a stimulant here given his multiple medical issues.  -repeat CT was done showing resolution of blood  -i have educated family regarding his BI and potential recovery given his age and injury  -work up has been negative for any other cause    11. BPH: continue Proscar daily 12. Right wrist: xray is supsicious for pseudogout, and ?scaphulolunate injury. No MRI due PPM. Manage conservatively  -voltaren gel 13. Wounds: local care 14. FEN-    push po-   -resumed home lasix  -check bmet today  LOS (Days) 17 A FACE TO FACE EVALUATION WAS PERFORMED  KIRSTEINS,ANDREW E 03/31/2012, 6:56 AM

## 2012-03-31 NOTE — Progress Notes (Signed)
Speech Language Pathology Daily Session Notes  Patient Details  Name: Eric [name] MRN: [id number] Date of Birth: 03/11/1926  Today's Date: 03/31/2012 Session 1 Time: 0800-0900 Time Calculation (min): 60 min  Session 2 Time: 1300-1345 Time Calculation: 45 minutes  Short Term Goals: Week 2: SLP Short Term Goal 1 (Week 2): Pt will consume thin liquids via straw without overt s/s of aspiraiton with Mod I.  SLP Short Term Goal 2 (Week 2): Pt will sustain attention to a functional task for 5 minutes with Max A multimodal cueing SLP Short Term Goal 3 (Week 2): Pt will identify 1 physical and 1 cognitive task with Max A multimodal cueing SLP Short Term Goal 4 (Week 2): Pt will express wants/needs with Max A mutlimodal cueing SLP Short Term Goal 5 (Week 2): Pt will orient to place, time and situation with Max A multimodal cueing  Skilled Therapeutic Interventions: Treatment focus on cognitive goals. Pt initiated meal with Mod A verbal cues and demonstrated problem solving for tray set up with Max verbal and visual cues. Pt required Max verbal cues for sustained attention to meal and was also overall Max A question cues for orientation to place and situation and required Mod question and semantic cues for verbal expression of wants/needs. Pt's caregiver and niece present throughout session and educated on pt's current cognitive function and strategies to utilize at home to increase initiation, attention, problem solving , memory and orientation. Handout also given. Caregiver verbalized and demonstrated understanding.    Session 2: Treatment focus on initiation, attention and problem solving. Pt participating in matching task from filed of two with 80% accuracy with Mod A visual and question cues and from field of three with 30% accuracy with Max A visual and question cues. Pt requested to brush his teeth and demonstrated problem solving with Max verbal and visual cues and required Min verbal cues to  switch tasks. Pt's caregiver present throughout session and demonstrated appropriate cueing throughout session.   FIM:  Comprehension Comprehension Mode: Auditory Comprehension: 2-Understands basic 25 - 49% of the time/requires cueing 51 - 75% of the time Expression Expression Mode: Verbal Expression: 2-Expresses basic 25 - 49% of the time/requires cueing 50 - 75% of the time. Uses single words/gestures. Problem Solving Problem Solving: 1-Solves basic less than 25% of the time - needs direction nearly all the time or does not effectively solve problems and may need a restraint for safety Memory Memory: 1-Recognizes or recalls less than 25% of the time/requires cueing greater than 75% of the time FIM - Eating Eating Activity: 5: Supervision/cues  Pain Pain Assessment Pain Assessment: No/denies pain Pain Score: 0-No pain  Therapy/Group: Individual Therapy  Ibraheem Voris 03/31/2012, 10:00 AM

## 2012-04-01 ENCOUNTER — Inpatient Hospital Stay (HOSPITAL_COMMUNITY): Payer: Medicare Other | Admitting: Speech Pathology

## 2012-04-01 ENCOUNTER — Inpatient Hospital Stay (HOSPITAL_COMMUNITY): Payer: Medicare Other | Admitting: Occupational Therapy

## 2012-04-01 NOTE — Plan of Care (Signed)
Problem: RH BOWEL ELIMINATION Goal: RH STG MANAGE BOWEL WITH ASSISTANCE STG Manage Bowel with Min Assistance.  Outcome: Not Progressing LBM 03-29-12 Goal: RH STG MANAGE BOWEL W/MEDICATION W/ASSISTANCE STG Manage Bowel with Medication with Min Assistance.  Outcome: Not Progressing LBM 03-29-12 Goal: RH STG MANAGE BOWEL W/EQUIPMENT W/ASSISTANCE STG Manage Bowel With Equipment With Min Assistance  Outcome: Not Progressing LBM 03-29-12

## 2012-04-01 NOTE — Progress Notes (Signed)
Patient ID: MACALLISTER ASHMEAD, male   DOB: Apr 16, 1926, 78 y.o.   MRN: [id number] OSTEN JANEK is a 78 y.o. male With history of A Fib on xarelto who sustained a ground level fall and struck his head against the curb on 03/08/12. Taken to [facility] where xrays done revealed R-frontal ICH. He was transferred to [facility] and evaluated by Dr. Wynetta Emery who recommended monitoring MS and serial CCT. MS has been stable. Last CT with stable right frontal hemorrhagic contusions. Right shoulder CT with suspicion of large retracted RTC tear with advanced degenerative changes as well as incidental note of large R-pleural effusion and small pulmonary nodules. Pulmonary feels patient with recurrent effusion likely due to heart failure No c/o just waking up A 12 point review of systems has been performed and if not noted above is otherwise negative.  BP Readings from Last 3 Encounters:  04/01/12 150/88  03/14/12 117/52  02/08/12 142/80   Objective: Vital Signs: Blood pressure 150/88, pulse 59, temperature 97.9 F (36.6 C), temperature source Axillary, resp. rate 17, weight 78.1 kg (172 lb 2.9 oz), SpO2 98.00%. No results found. No results found for this basename: WBC:2,HGB:2,HCT:2,PLT:2 in the last 72 hours  Basename 03/30/12 0918  NA 137  K 4.4  CL 101  CO2 27  GLUCOSE 191*  BUN 23  CREATININE 0.84  CALCIUM 9.3   CBG (last 3)  No results found for this basename: GLUCAP:3 in the last 72 hours  Wt Readings from Last 3 Encounters:  03/31/12 78.1 kg (172 lb 2.9 oz)  03/14/12 76 kg (167 lb 8.8 oz)  02/08/12 75.751 kg (167 lb)    Physical Exam:  Constitutional: He appears well-developed and well-nourished.  HENT:  Head: Normocephalic.  Resolving bruises and hematomas over right face and eye Eyes: Pupils are equal, round, and reactive to light.  Right scleral hemorrhage. Wound over right eye healing. Sutures in tact Neck: Normal range of motion.  Cardiovascular: Normal rate and regular rhythm.  Murmur heard.    Pulmonary/Chest: Effort normal and breath sounds normal.  Abdominal: Soft. Bowel sounds are normal.  Musculoskeletal: He exhibits no edema.  RUE with diffuse ecchymosis at shoulder and biceps area. Right hip/thigh with evidence of hematoma and tenderness mid-thigh. Diffuse ecchymosis right flank to right thigh area. Pain at right scaphoid, with decreased grip. Right shoulder tender with AROM and PROM Neurological: He is alert.  Just woke up..   Limited insight. poor day to day memory.  Difficulty following commandsSkin: Skin is warm and dry.  Oriented to person and "Jessamine cty"   Assessment/Plan: 1. Functional deficits secondary to traumatic brain injury which require 3+ hours per day of interdisciplinary therapy in a comprehensive inpatient rehab setting. Physiatrist is providing close team supervision and 24 hour management of active medical problems listed below. Physiatrist and rehab team continue to assess barriers to discharge/monitor patient progress toward functional and medical goals.  Pt just waking up during exam, will monitor during the day, if remains lethargic further work up may be needed.  Recent repeat work up reveal no new medical issues complicating TBI    FIM: FIM - Bathing Bathing Steps Patient Completed: Chest;Right Arm;Left Arm;Abdomen;Front perineal area;Right upper leg;Left upper leg;Right lower leg (including foot) Bathing: 4: Min-Patient completes 8-9 32f 10 parts or 75+ percent  FIM - Upper Body Dressing/Undressing Upper body dressing/undressing steps patient completed: Thread/unthread right sleeve of pullover shirt/dresss;Thread/unthread left sleeve of pullover shirt/dress;Pull shirt over trunk;Put head through opening of pull over shirt/dress Upper  body dressing/undressing: 5: Supervision: Safety issues/verbal cues FIM - Lower Body Dressing/Undressing Lower body dressing/undressing steps patient completed: Thread/unthread right pants leg;Don/Doff right  sock;Don/Doff left sock;Don/Doff right shoe;Fasten/unfasten right shoe;Don/Doff left shoe;Fasten/unfasten left shoe;Pull pants up/down Lower body dressing/undressing: 4: Min-Patient completed 75 plus % of tasks  FIM - Toileting Toileting steps completed by patient: Performs perineal hygiene Toileting Assistive Devices: Grab bar or rail for support Toileting: 2: Max-Patient completed 1 of 3 steps  FIM - Diplomatic Services operational officer Devices: Grab bars;Walker Toilet Transfers: 4-To toilet/BSC: Min A (steadying Pt. > 75%);4-From toilet/BSC: Min A (steadying Pt. > 75%)  FIM - Banker Devices: Walker;Arm rests Bed/Chair Transfer: 4: Bed > Chair or W/C: Min A (steadying Pt. > 75%);4: Chair or W/C > Bed: Min A (steadying Pt. > 75%)  FIM - Locomotion: Wheelchair Distance: 150 Locomotion: Wheelchair: 1: Total Assistance/staff pushes wheelchair (Pt<25%) FIM - Locomotion: Ambulation Locomotion: Ambulation Assistive Devices: Designer, industrial/product Ambulation/Gait Assistance: 4: Min assist Locomotion: Ambulation: 2: Travels 50 - 149 ft with minimal assistance (Pt.>75%)  Comprehension Comprehension Mode: Auditory Comprehension: 2-Understands basic 25 - 49% of the time/requires cueing 51 - 75% of the time  Expression Expression Mode: Verbal Expression: 2-Expresses basic 25 - 49% of the time/requires cueing 50 - 75% of the time. Uses single words/gestures.  Social Interaction Social Interaction: 2-Interacts appropriately 25 - 49% of time - Needs frequent redirection.  Problem Solving Problem Solving: 1-Solves basic less than 25% of the time - needs direction nearly all the time or does not effectively solve problems and may need a restraint for safety  Memory Memory: 1-Recognizes or recalls less than 25% of the time/requires cueing greater than 75% of the time Medical Problem List and Plan:  1. DVT Prophylaxis/Anticoagulation: Mechanical:  Sequential compression devices, below knee Bilateral lower extremities  2. Pain Management: denies any pain at current time. Wife reports arthritis "all over"  3. Mood: Monitor for now. Effexor resumed. LCSW to follow up for formal evaluation.  4. Neuropsych: This patient is not capable of making decisions on his/her own behalf.  5. CAD: Low dose Ace resumed. bp has been generally well controlled 6. Atrial Fibrillation: Off blood thinner due to ICH. Monitor HR with bid checks.  7. Chronic anemia:hgb back to 9.0 after transfusion. He his baseline hgb is around 9.0 8. OSA: Continue CPAP with one liter O2 whenever sleeping.  9. H/O Gastrectomy with gastroparesis: Small meals. Will add snacks to help with intake.  10. Lethargy:sleeping better, stamina improving. Not very anxious to start a stimulant here given his multiple medical issues.  -repeat CT was done showing resolution of blood  -i have educated family regarding his BI and potential recovery given his age and injury  -work up has been negative for any other cause    11. BPH: continue Proscar daily 12. Right wrist: xray is supsicious for pseudogout, and ?scaphulolunate injury. No MRI due PPM. Manage conservatively  -voltaren gel 13. Wounds: local care 14. FEN-    push po-   -resumed home lasix    LOS (Days) 18 A FACE TO FACE EVALUATION WAS PERFORMED  Ulas Zuercher E 04/01/2012, 6:53 AM

## 2012-04-01 NOTE — Progress Notes (Signed)
Speech Language Pathology Daily Session Note  Patient Details  Name: Eric [name] MRN: [id number] Date of Birth: [birth year]  Today's Date: 04/01/2012 Time: 1100-1130 Time Calculation (min): 30 min  Short Term Goals: Week 2: SLP Short Term Goal 1 (Week 2): Pt will consume thin liquids via straw without overt s/s of aspiraiton with Mod I.  SLP Short Term Goal 2 (Week 2): Pt will sustain attention to a functional task for 5 minutes with Max A multimodal cueing SLP Short Term Goal 3 (Week 2): Pt will identify 1 physical and 1 cognitive task with Max A multimodal cueing SLP Short Term Goal 4 (Week 2): Pt will express wants/needs with Max A mutlimodal cueing SLP Short Term Goal 5 (Week 2): Pt will orient to place, time and situation with Max A multimodal cueing  Skilled Therapeutic Interventions: Treatment focused on cognitive goals. Pt required Max verbal cues for initiation and sustained attention to basic familiar sorting task from a field of three and was also overall Max A to Total A question cues for orientation to place and situation and required Mod A question and semantic cues for verbal expression of wants/needs.    FIM:  Comprehension Comprehension Mode: Auditory Comprehension: 2-Understands basic 25 - 49% of the time/requires cueing 51 - 75% of the time Expression Expression Mode: Verbal Expression: 2-Expresses basic 25 - 49% of the time/requires cueing 50 - 75% of the time. Uses single words/gestures. Social Interaction Social Interaction: 2-Interacts appropriately 25 - 49% of time - Needs frequent redirection. Problem Solving Problem Solving: 1-Solves basic less than 25% of the time - needs direction nearly all the time or does not effectively solve problems and may need a restraint for safety Memory Memory: 1-Recognizes or recalls less than 25% of the time/requires cueing greater than 75% of the time  Pain Pain Assessment Pain Assessment: No/denies  pain  Therapy/Group: Individual Therapy  Jackalyn Lombard, Conrad Wiscon  Graduate Clinician Speech Language Pathology   Arriel Victor, Joni Reining 04/01/2012, 4:12 PM

## 2012-04-01 NOTE — Progress Notes (Signed)
Occupational Therapy Session Note  Patient Details  Name: Eric [name] MRN: [id number] Date of Birth: 12/20/1925  Today's Date: 04/01/2012 Time: 1330-1400 Time Calculation (min): 30 min  Short Term Goals: Week 2:  OT Short Term Goal 1 (Week 2): Pt will dress UB with minimal verbal cuing. OT Short Term Goal 2 (Week 2): Pt will dress LB Min A consistently. OT Short Term Goal 3 (Week 2): Pt will complete bathing tasks with Min cues for sequencing.  Skilled Therapeutic Interventions/Progress Updates:    Pt seen for 1:1 OT with focus on initiation with sit <> stand and functional ambulation with transfer to toilet.  Pt required max cues for sequencing and initiation with sit <> stand from recliner.  When asked what to do, pt reports "just stand up", however required increased weight time and tactile cues for sit <> stand.  Pt ambulated with min-mod assist to toilet and completed toilet transfer with verbal cues for positioning prior to sitting down.  Engaged in recall questioning to address memory and orientation.  Therapy Documentation Precautions:  Precautions Precautions: Fall Restrictions Weight Bearing Restrictions: No General:   Vital Signs: Therapy Vitals Temp: 98.4 F (36.9 C) Temp src: Oral Pulse Rate: 67  Resp: 18  BP: 119/64 mmHg Patient Position, if appropriate: Sitting Oxygen Therapy SpO2: 100 % O2 Device: None (Room air) Pain:  Pt with no c/o pain this session.  See FIM for current functional status  Therapy/Group: Individual Therapy  Leonette Monarch 04/01/2012, 3:49 PM

## 2012-04-01 NOTE — Progress Notes (Signed)
The skilled treatment note has been reviewed and SLP is in agreement. Vetra Shinall, M.A., CCC-SLP [phone]  

## 2012-04-02 ENCOUNTER — Inpatient Hospital Stay (HOSPITAL_COMMUNITY): Payer: Medicare Other | Admitting: Physical Therapy

## 2012-04-02 NOTE — Progress Notes (Signed)
Physical Therapy Session Note  Patient Details  Name: Eric [name] MRN: [id number] Date of Birth: [birth year]  Today's Date: 04/02/2012 Time: 4696-2952 Time Calculation (min): 28 min  Short Term Goals: Week 2:  PT Short Term Goal 1 (Week 2): Pt will consistently perform sit to stand transfers with min A PT Short Term Goal 1 - Progress (Week 2): Progressing toward goal PT Short Term Goal 2 (Week 2): Pt will gait with min A 50' in controlled environment PT Short Term Goal 2 - Progress (Week 2): Progressing toward goal PT Short Term Goal 3 (Week 2): Pt will attend to functional task in standing x 90 seconds with min A PT Short Term Goal 3 - Progress (Week 2): Progressing toward goal  Skilled Therapeutic Interventions/Progress Updates:   Pt not oriented to place or situation.  Wife present and stated pt needed to work on walking on carpet.  Addressed gait training on carpet with RW, pt with short steps and lacks heel strike, tending to catch his left toes during swing through.  Needing instructional cues to increase step length and height.  Distances of approx. 50-80' x 3.  Pt "falls" to sit even with instructional cues. Difficulty with sit to stand from low surfaces, max@, from higher surface with mod@.  Therapy Documentation Precautions:  Precautions Precautions: Fall Restrictions Weight Bearing Restrictions: No Pain:  No pain reported.  See FIM for current functional status  Therapy/Group: Individual Therapy  Georges Mouse 04/02/2012, 3:25 PM

## 2012-04-02 NOTE — Progress Notes (Signed)
Patient resting on home cpap tolerating well. Family member at bedside.

## 2012-04-02 NOTE — Progress Notes (Signed)
Patient ID: Eric [name], male   DOB: 1926-04-26, 78 y.o.   MRN: [id number] Eric [name] is a 78 y.o. male With history of A Fib on xarelto who sustained a ground level fall and struck his head against the curb on 03/08/12. Taken to [facility] where xrays done revealed R-frontal ICH. He was transferred to [facility] and evaluated by Dr. Wynetta Emery who recommended monitoring MS and serial CCT. MS has been stable. Last CT with stable right frontal hemorrhagic contusions. Right shoulder CT with suspicion of large retracted RTC tear with advanced degenerative changes as well as incidental note of large R-pleural effusion and small pulmonary nodules. Pulmonary feels patient with recurrent effusion likely due to heart failure No c/o just waking up A 12 point review of systems has been performed and if not noted above is otherwise negative.  BP Readings from Last 3 Encounters:  04/02/12 166/97  03/14/12 117/52  02/08/12 142/80   Objective: Vital Signs: Blood pressure 166/97, pulse 60, temperature 97.4 F (36.3 C), temperature source Oral, resp. rate 17, weight 78.1 kg (172 lb 2.9 oz), SpO2 98.00%. No results found. No results found for this basename: WBC:2,HGB:2,HCT:2,PLT:2 in the last 72 hours  Basename 03/30/12 0918  NA 137  K 4.4  CL 101  CO2 27  GLUCOSE 191*  BUN 23  CREATININE 0.84  CALCIUM 9.3   CBG (last 3)  No results found for this basename: GLUCAP:3 in the last 72 hours  Wt Readings from Last 3 Encounters:  03/31/12 78.1 kg (172 lb 2.9 oz)  03/14/12 76 kg (167 lb 8.8 oz)  02/08/12 75.751 kg (167 lb)    Physical Exam:  Constitutional: He appears well-developed and well-nourished.  HENT:  Head: Normocephalic.  Resolving bruises and hematomas over right face and eye Eyes: Pupils are equal, round, and reactive to light.  Right scleral hemorrhage. Wound over right eye healing. Sutures in tact Neck: Normal range of motion.  Cardiovascular: Normal rate and regular rhythm.  Murmur heard.    Pulmonary/Chest: Effort normal and breath sounds normal.  Abdominal: Soft. Bowel sounds are normal.  Musculoskeletal: He exhibits no edema.  RUE with diffuse ecchymosis at shoulder and biceps area. Right hip/thigh with evidence of hematoma and tenderness mid-thigh. Diffuse ecchymosis right flank to right thigh area. . Right shoulder tender with AROM and PROM Neurological: He is alert.  Just woke up..   Limited insight. poor day to day memory.  Difficulty following commandsSkin: Skin is warm and dry.  Oriented to person and doctors office   Assessment/Plan: 1. Functional deficits secondary to traumatic brain injury which require 3+ hours per day of interdisciplinary therapy in a comprehensive inpatient rehab setting. Physiatrist is providing close team supervision and 24 hour management of active medical problems listed below. Physiatrist and rehab team continue to assess barriers to discharge/monitor patient progress toward functional and medical goals.    FIM: FIM - Bathing Bathing Steps Patient Completed: Chest;Right Arm;Left Arm;Abdomen;Front perineal area;Right upper leg;Left upper leg;Right lower leg (including foot) Bathing: 4: Min-Patient completes 8-9 51f 10 parts or 75+ percent  FIM - Upper Body Dressing/Undressing Upper body dressing/undressing steps patient completed: Thread/unthread right sleeve of pullover shirt/dresss;Thread/unthread left sleeve of pullover shirt/dress;Pull shirt over trunk;Put head through opening of pull over shirt/dress Upper body dressing/undressing: 5: Supervision: Safety issues/verbal cues FIM - Lower Body Dressing/Undressing Lower body dressing/undressing steps patient completed: Thread/unthread right pants leg;Don/Doff right sock;Don/Doff left sock;Don/Doff right shoe;Fasten/unfasten right shoe;Don/Doff left shoe;Fasten/unfasten left shoe;Pull pants up/down Lower body dressing/undressing:  4: Min-Patient completed 75 plus % of tasks  FIM -  Toileting Toileting steps completed by patient: Performs perineal hygiene Toileting Assistive Devices: Grab bar or rail for support Toileting: 1: Total-Patient completed zero steps, helper did all 3  FIM - Diplomatic Services operational officer Devices: Grab bars;Walker Toilet Transfers: 3-From toilet/BSC: Mod A (lift or lower assist);3-To toilet/BSC: Mod A (lift or lower assist)  FIM - Banker Devices: Walker;Arm rests Bed/Chair Transfer: 4: Bed > Chair or W/C: Min A (steadying Pt. > 75%);4: Chair or W/C > Bed: Min A (steadying Pt. > 75%)  FIM - Locomotion: Wheelchair Distance: 150 Locomotion: Wheelchair: 1: Total Assistance/staff pushes wheelchair (Pt<25%) FIM - Locomotion: Ambulation Locomotion: Ambulation Assistive Devices: Designer, industrial/product Ambulation/Gait Assistance: 4: Min assist Locomotion: Ambulation: 2: Travels 50 - 149 ft with minimal assistance (Pt.>75%)  Comprehension Comprehension Mode: Auditory Comprehension: 2-Understands basic 25 - 49% of the time/requires cueing 51 - 75% of the time  Expression Expression Mode: Verbal Expression: 2-Expresses basic 25 - 49% of the time/requires cueing 50 - 75% of the time. Uses single words/gestures.  Social Interaction Social Interaction: 2-Interacts appropriately 25 - 49% of time - Needs frequent redirection.  Problem Solving Problem Solving: 1-Solves basic less than 25% of the time - needs direction nearly all the time or does not effectively solve problems and may need a restraint for safety  Memory Memory: 1-Recognizes or recalls less than 25% of the time/requires cueing greater than 75% of the time Medical Problem List and Plan:  1. DVT Prophylaxis/Anticoagulation: Mechanical: Sequential compression devices, below knee Bilateral lower extremities  2. Pain Management: denies any pain at current time. Wife reports arthritis "all over"  3. Mood: Monitor for now. Effexor resumed.  LCSW to follow up for formal evaluation.  4. Neuropsych: This patient is not capable of making decisions on his/her own behalf.  5. CAD: Low dose Ace resumed. bp has been generally well controlled 6. Atrial Fibrillation: Off blood thinner due to ICH. Monitor HR with bid checks.  7. Chronic anemia:hgb back to 9.0 after transfusion. He his baseline hgb is around 9.0 8. OSA: Continue CPAP with one liter O2 whenever sleeping.  9. H/O Gastrectomy with gastroparesis: Small meals. Will add snacks to help with intake.  10. Lethargy:sleeping better, stamina improving. Not very anxious to start a stimulant here given his multiple medical issues.  -repeat CT was done showing resolution of blood  -i have educated family regarding his BI and potential recovery given his age and injury  -work up has been negative for any other cause    11. BPH: continue Proscar daily 12. Right wrist:Pain improving,  xray is supsicious for pseudogout, and ?scaphulolunate injury. No MRI due PPM. Manage conservatively  -voltaren gel 13. Wounds: local care 14. FEN-    push po-   -resumed home lasix    LOS (Days) 19 A FACE TO FACE EVALUATION WAS PERFORMED  KIRSTEINS,ANDREW E 04/02/2012, 6:49 AM

## 2012-04-03 ENCOUNTER — Inpatient Hospital Stay (HOSPITAL_COMMUNITY): Payer: Medicare Other | Admitting: Speech Pathology

## 2012-04-03 DIAGNOSIS — W19XXXA Unspecified fall, initial encounter: Secondary | ICD-10-CM

## 2012-04-03 DIAGNOSIS — S069X9A Unspecified intracranial injury with loss of consciousness of unspecified duration, initial encounter: Secondary | ICD-10-CM

## 2012-04-03 MED ORDER — DICLOFENAC SODIUM 1 % TD GEL: 2.0000 g | Freq: Four times a day (QID) | TRANSDERMAL | Status: DC

## 2012-04-03 MED ORDER — ASPIRIN 325 MG PO TBEC: 325.0000 mg | DELAYED_RELEASE_TABLET | Freq: Every day | ORAL | Status: AC

## 2012-04-03 MED ORDER — POLYETHYLENE GLYCOL 3350 17 G PO PACK: 17.0000 g | PACK | Freq: Every day | ORAL | Status: DC

## 2012-04-03 MED ORDER — ENALAPRIL MALEATE 5 MG PO TABS: 5.0000 mg | ORAL_TABLET | Freq: Two times a day (BID) | ORAL | Status: DC

## 2012-04-03 NOTE — Progress Notes (Signed)
Social Work  Discharge Note  The overall goal for the admission was met for:   Discharge location: Yes - home with family and private duty care providing 24/7 assistance  Length of Stay: Yes - 20 days  Discharge activity level: Yes - minimal to moderate assistance overall  Home/community participation: Yes  Services provided included: MD, RD, PT, OT, SLP, RN, TR, Pharmacy, Neuropsych and SW  Financial Services: Medicare and Private Insurance: BCBS  Follow-up services arranged: Home Health: PT, OT, ST via [facility], DME: 18x18 wheelchair, cushion via [facility] and Patient/Family request agency HH: [facility], DME: none  Comments (or additional information):  Patient/Family verbalized understanding of follow-up arrangements: Yes  Individual responsible for coordination of the follow-up plan: wife   Confirmed correct DME delivered: Amada Jupiter 04/03/2012    Ronna Herskowitz

## 2012-04-03 NOTE — Progress Notes (Signed)
Pt discharged to home with family at  74. Discharge instructions given to family with no further questions at this time. Belongings with family.

## 2012-04-03 NOTE — Progress Notes (Signed)
Occupational Therapy Discharge Summary  Patient Details  Name: Eric [name] MRN: [id number] Date of Birth: Aug 17, 1925  Today's Date: 04/03/2012  Patient has met 1 of 12 long term goals.  Patient to discharge at S for grooming, Min bathing, S to Min UB dressing, Mod-Max LB dressing, Total toileting.  Patient's care partner (pt has hired caregiver, [name]) is independent to provide the necessary physical and cognitive assistance at discharge. Family and caregiver were given written home program for cogintion, ADL and home safety tips.  Pt with limited cognitive gains and continues to demonstrate behaviors consistent with a Rancho Level V and requires Max-Total A for orientation, sustained attention, functional problem solving, working memory, and intellectual awareness; having a significant impact on his functional mobility. Pt would benefit from f/u home health HHOT intervention to maximize cognitive function and overall independence with ADL performance and functional mobility.    Reasons goals not met: Pt continues to have cognitive deficits that affect progression with therapy in this setting and impeded ability to reach long-term goals. Pt demonstrates decreased sustained attention, difficulty with organization and sequencing and decreased ability to follow one-step simple commands.   Recommendation:  Patient will benefit from ongoing skilled OT services in home health setting to continue to advance functional skills in the area of BADL.  Equipment: No equipment provided  Reasons for discharge: discharge from hospital  Patient/family agrees with progress made and goals achieved: Yes  OT Discharge Precautions/Restrictions  Precautions Precautions: Fall Restrictions Weight Bearing Restrictions: No ADL  See FIM Vision/Perception  Vision - History Baseline Vision: Wears glasses only for reading Vision - Assessment Eye Alignment: Within Functional Limits Perception Perception:  Within Functional Limits Praxis Praxis:  (difficult to assess due to lower level cognitive deficits) Praxis Impairment Details: Perseveration;Initiation  Cognition Overall Cognitive Status: Impaired Arousal/Alertness: Awake/alert Orientation Level: Oriented to person;Oriented to situation Attention: Sustained Focused Attention: Appears intact Sustained Attention: Impaired Sustained Attention Impairment: Verbal basic;Functional basic Selective Attention: Impaired Selective Attention Impairment: Verbal basic;Functional basic Memory: Impaired Memory Impairment: Decreased recall of new information;Decreased long term memory;Decreased short term memory Decreased Long Term Memory: Verbal basic;Functional basic Decreased Short Term Memory: Verbal basic;Functional basic Awareness: Impaired Awareness Impairment: Intellectual impairment Problem Solving: Impaired Problem Solving Impairment: Verbal basic;Functional basic Reasoning: Impaired Reasoning Impairment: Verbal basic;Functional basic Sequencing: Impaired Sequencing Impairment: Verbal basic;Functional basic Organizing: Impaired Organizing Impairment: Verbal basic;Functional basic Decision Making: Impaired Decision Making Impairment: Verbal basic;Functional basic Initiating: Impaired Initiating Impairment: Verbal basic;Functional basic Self Monitoring: Impaired Self Monitoring Impairment: Verbal basic;Functional basic Self Correcting: Impaired Self Correcting Impairment: Verbal basic;Functional basic Behaviors: Perseveration Safety/Judgment: Impaired Rancho Mirant Scales of Cognitive Functioning: Confused/inappropriate/non-agitated Sensation Sensation Light Touch: Appears Intact Proprioception: Appears Intact Coordination Gross Motor Movements are Fluid and Coordinated: Yes Fine Motor Movements are Fluid and Coordinated: Yes Motor  Motor Motor: Abnormal postural alignment and control Motor - Skilled Clinical  Observations: generalized weakness, decreased standing endurance Mobility  Bed Mobility Supine to Sit: 3: Mod assist;HOB flat Transfers Sit to Stand: 4: Min assist;With upper extremity assist;With armrests  Trunk/Postural Assessment  Cervical Assessment Cervical Assessment: Within Functional Limits Thoracic Assessment Thoracic Assessment: Exceptions to [facility] (rounded shoulders/back; hunched posture) Lumbar Assessment Lumbar Assessment: Exceptions to WFL (scoliosis, posterior pelvic tilt)  Balance Static Sitting Balance Static Sitting - Balance Support: No upper extremity supported;Feet supported Static Sitting - Level of Assistance: 6: Modified independent (Device/Increase time) Static Standing Balance Static Standing - Balance Support: During functional activity Static Standing - Level of Assistance: 4:  Min assist Dynamic Standing Balance Dynamic Standing - Level of Assistance: 3: Mod assist Extremity/Trunk Assessment RUE Assessment RUE Assessment: Within Functional Limits (gout R hand (long standing)) LUE Assessment LUE Assessment: Within Functional Limits  See FIM for current functional status  Jackelyn Poling 04/03/2012, 3:38 PM

## 2012-04-03 NOTE — Progress Notes (Signed)
Patient ID: Eric [name], male   DOB: 02/26/1926, 78 y.o.   MRN: [id number] Eric [name] is a 78 y.o. male  No c/o just waking up A 12 point review of systems has been performed and if not noted above is otherwise negative.  BP Readings from Last 3 Encounters:  04/03/12 163/88  03/14/12 117/52  02/08/12 142/80   Objective: Vital Signs: Blood pressure 163/88, pulse 60, temperature 97.5 F (36.4 C), temperature source Axillary, resp. rate 18, weight 70.2 kg (154 lb 12.2 oz), SpO2 100.00%. No results found. No results found for this basename: WBC:2,HGB:2,HCT:2,PLT:2 in the last 72 hours No results found for this basename: NA:2,K:2,CL:2,CO2:2,GLUCOSE:2,BUN:2,CREATININE:2,CALCIUM:2 in the last 72 hours CBG (last 3)  No results found for this basename: GLUCAP:3 in the last 72 hours  Wt Readings from Last 3 Encounters:  04/03/12 70.2 kg (154 lb 12.2 oz)  03/14/12 76 kg (167 lb 8.8 oz)  02/08/12 75.751 kg (167 lb)    Physical Exam:  Constitutional: He appears well-developed and well-nourished.  HENT:  Head: Normocephalic.  Resolving bruises and hematomas over right face and eye Eyes: Pupils are equal, round, and reactive to light.  Right scleral hemorrhage. Wound over right eye healing. Sutures in tact Neck: Normal range of motion.  Cardiovascular: Normal rate and regular rhythm.  Murmur heard.  Pulmonary/Chest: Effort normal and breath sounds normal.  Abdominal: Soft. Bowel sounds are normal.  Musculoskeletal: He exhibits no edema.  RUE with diffuse ecchymosis at shoulder and biceps area. Right hip/thigh with evidence of hematoma and tenderness mid-thigh. Diffuse ecchymosis right flank to right thigh area. . Right shoulder tender with AROM and PROM Neurological: He is alert.  Just woke up..   Limited insight. poor day to day memory.  Difficulty following commandsSkin: Skin is warm and dry.  Oriented to person and doctors office   Assessment/Plan: 1. Functional deficits  secondary to traumatic brain injury which require 3+ hours per day of interdisciplinary therapy in a comprehensive inpatient rehab setting. Physiatrist is providing close team supervision and 24 hour management of active medical problems listed below. Physiatrist and rehab team continue to assess barriers to discharge/monitor patient progress toward functional and medical goals.   Home today with 24 hour supervsion. Needs therapy follow up. Education provided FIM: FIM - Bathing Bathing Steps Patient Completed: Chest;Right Arm;Abdomen;Front perineal area;Right upper leg;Left upper leg Bathing: 3: Mod-Patient completes 5-7 76f 10 parts or 50-74%  FIM - Upper Body Dressing/Undressing Upper body dressing/undressing steps patient completed: Thread/unthread right sleeve of pullover shirt/dresss;Thread/unthread left sleeve of pullover shirt/dress;Put head through opening of pull over shirt/dress;Pull shirt over trunk Upper body dressing/undressing: 5: Supervision: Safety issues/verbal cues FIM - Lower Body Dressing/Undressing Lower body dressing/undressing steps patient completed: Thread/unthread right pants leg;Thread/unthread left pants leg Lower body dressing/undressing: 2: Max-Patient completed 25-49% of tasks  FIM - Toileting Toileting steps completed by patient: Performs perineal hygiene Toileting Assistive Devices: Grab bar or rail for support Toileting: 2: Max-Patient completed 1 of 3 steps  FIM - Diplomatic Services operational officer Devices: Grab bars;Walker Toilet Transfers: 4-To toilet/BSC: Min A (steadying Pt. > 75%);4-From toilet/BSC: Min A (steadying Pt. > 75%)  FIM - Bed/Chair Transfer Bed/Chair Transfer Assistive Devices: Therapist, occupational: 4: Bed > Chair or W/C: Min A (steadying Pt. > 75%)  FIM - Locomotion: Wheelchair Distance: 150 Locomotion: Wheelchair: 1: Total Assistance/staff pushes wheelchair (Pt<25%) FIM - Locomotion: Ambulation Locomotion: Ambulation  Assistive Devices: Designer, industrial/product Ambulation/Gait Assistance: 4: Min assist Locomotion: Ambulation: 2:  Travels 50 - 149 ft with moderate assistance (Pt: 50 - 74%)  Comprehension Comprehension Mode: Auditory Comprehension: 2-Understands basic 25 - 49% of the time/requires cueing 51 - 75% of the time  Expression Expression Mode: Verbal Expression: 2-Expresses basic 25 - 49% of the time/requires cueing 50 - 75% of the time. Uses single words/gestures.  Social Interaction Social Interaction: 2-Interacts appropriately 25 - 49% of time - Needs frequent redirection.  Problem Solving Problem Solving: 1-Solves basic less than 25% of the time - needs direction nearly all the time or does not effectively solve problems and may need a restraint for safety  Memory Memory: 1-Recognizes or recalls less than 25% of the time/requires cueing greater than 75% of the time Medical Problem List and Plan:  1. DVT Prophylaxis/Anticoagulation: Mechanical: Sequential compression devices, below knee Bilateral lower extremities  2. Pain Management: denies any pain at current time. Wife reports arthritis "all over"  3. Mood: Monitor for now. Effexor resumed. LCSW to follow up for formal evaluation.  4. Neuropsych: This patient is not capable of making decisions on his/her own behalf.  5. CAD: Low dose Ace resumed. bp has been generally well controlled 6. Atrial Fibrillation: Off blood thinner due to ICH. Monitor HR with bid checks.  7. Chronic anemia:hgb back to 9.0 after transfusion. He his baseline hgb is around 9.0 8. OSA: Continue CPAP with one liter O2 whenever sleeping.  9. H/O Gastrectomy with gastroparesis: Small meals. Will add snacks to help with intake.  10. Lethargy:sleeping better, stamina improving. Not very anxious to start a stimulant here given his multiple medical issues.  -repeat CT was done showing resolution of blood  -i have educated family regarding his BI and potential recovery given his  age and injury  -work up has been negative for any other cause    11. BPH: continue Proscar daily 12. Right wrist:Pain improving,  xray is supsicious for pseudogout, and ?scaphulolunate injury. No MRI due PPM. Manage conservatively  -voltaren gel 13. Wounds: local care 14. FEN-    push po-   -resumed home lasix    LOS (Days) 20 A FACE TO FACE EVALUATION WAS PERFORMED  SWARTZ,ZACHARY T 04/03/2012, 7:21 AM

## 2012-04-03 NOTE — Progress Notes (Signed)
Physical Therapy Discharge Summary  Patient Details  Name: Eric [name] MRN: [id number] Date of Birth: 1926/01/18  Today's Date: 04/03/2012  Patient has met 8 of 9 long term goals due to improved activity tolerance, improved balance, increased strength, improved attention and improved coordination.  Patient to discharge at an ambulatory level Mod Assist.   Patient's care partner is independent to provide the necessary physical and cognitive assistance at discharge.  Pt has made slow physical progress and is able to perform at a min-mod A level all mobility, transfers and short distance ambulation.  Pt's abilities vary greatly with fatigue and time of day.  Pt has made limited cognitive progress and continues to present with behaviors consistent with a RLOS V.  Pt's caregiver has been educated and is able to provide necessary assistance.  Reasons goals not met: pt continues to require total A for intellectual awareness  Recommendation:  Patient will benefit from ongoing skilled PT services in home health setting to continue to advance safe functional mobility, address ongoing impairments in attention, awareness, strength, gait, balance, functional mobility, and minimize fall risk.  Equipment: w/c  Reasons for discharge: treatment goals met and discharge from hospital  Patient/family agrees with progress made and goals achieved: Yes  PT Discharge   Cognition Overall Cognitive Status: Impaired Arousal/Alertness: Awake/alert Orientation Level: Oriented to person;Oriented to situation Attention: Sustained Focused Attention: Appears intact Sustained Attention: Impaired Sustained Attention Impairment: Verbal basic;Functional basic Selective Attention: Impaired Selective Attention Impairment: Verbal basic;Functional basic Awareness: Impaired Awareness Impairment: Intellectual impairment Behaviors: Perseveration Safety/Judgment: Impaired Rancho Mirant Scales of Cognitive  Functioning: Confused/inappropriate/non-agitated Sensation Sensation Light Touch: Appears Intact Proprioception: Appears Intact Coordination Gross Motor Movements are Fluid and Coordinated: Yes Fine Motor Movements are Fluid and Coordinated: Yes Motor  Motor Motor: Abnormal postural alignment and control Motor - Skilled Clinical Observations: generalized weakness, decreased standing endurance   Trunk/Postural Assessment  Cervical Assessment Cervical Assessment: Within Functional Limits Thoracic Assessment Thoracic Assessment:  (kyphosis) Lumbar Assessment Lumbar Assessment:  (posterior tilt, scoliosis) Postural Control Postural Control:  (fwd flexed posture)  Balance Static Sitting Balance Static Sitting - Balance Support: No upper extremity supported;Feet supported Static Sitting - Level of Assistance: 6: Modified independent (Device/Increase time) Static Standing Balance Static Standing - Balance Support: During functional activity Static Standing - Level of Assistance: 4: Min assist Dynamic Standing Balance Dynamic Standing - Level of Assistance: 3: Mod assist Extremity Assessment  RUE Assessment RUE Assessment: Within Functional Limits (gout R hand (long standing)) LUE Assessment LUE Assessment: Within Functional Limits RLE Assessment RLE Assessment:  (grossly 3/5, hamstring tightness) LLE Assessment LLE Assessment:  (grossly 3/5, hamstring tightness)  See FIM for current functional status  [name] 04/03/2012, 6:24 PM

## 2012-04-03 NOTE — Progress Notes (Signed)
Recreational Therapy Discharge Summary Patient Details  Name: Eric [name] MRN: [id number] Date of Birth: [birth year] Today's Date: 04/03/2012  Long term goals set: 1  Long term goals met: 0  Comments on progress toward goals: Pt with limited in participation in TR services during LOS.  Wife described pt as "working all the time."  Session/discussions focused on activity tolerance, balance, safety, use of leisure time, and education regarding community pursuits.  Wife present and observing all sessions.  Pt varies in amount of assist needed depending on fatigue, anywhere from min-max assist and mod-max cuing.  Pt is discharging home today with hired help.  Reasons for discharge: discharge from hospital  Patient/family agrees with progress made and goals achieved: Yes  [name] 04/03/2012, 8:20 AM

## 2012-04-03 NOTE — Progress Notes (Signed)
Speech Language Pathology Discharge Summary  Patient Details  Name: Eric [name] MRN: [id number] Date of Birth: 13-Sep-1925  Today's Date: 04/03/2012 Time: 0800-0830 Time Calculation (min): 30 min  Skilled Therapeutic Intervention: Pt required total A to orient to place but Mod question cues to orient to situation. Pt also required Total A to recall his discharge from the hospital after a 1 minute delay. Pt and family reported no further questions at this time and caregiver demonstrated appropriate cueing with pt throughout meal for problem solving and attention.   Patient has met 3 of 7 long term goals.  Patient to discharge at [facility] Max;Total level.   Reasons goals not met: Pt continues to require Max-Total A for functional probem solving, orientation, working memory and awareness of deficits    Clinical Impression/Discharge Summary: Pt with limited cognitive gains and continues to demonstrate behaviors consistent with a Rancho Level V and requires Max-Total A for orientation, sustained attention, functional problem solving, working memory, and Emergency planning/management officer. Pt is tolerating regular textures with thin liquids with supervision required for tray set-up. Pt/family education complete and pt will discharge home with 24 hour supervision. Pt would benefit from f/u home health SLP intervention to maximize cognitive function and overall independence.    Care Partner:  Caregiver Able to Provide Assistance: Yes  Type of Caregiver Assistance: Physical;Cognitive  Recommendation:  24 hour supervision/assistance;Home Health SLP  Rationale for SLP Follow Up: Maximize cognitive function and independence;Reduce caregiver burden   Equipment: N/A   Reasons for discharge: Discharged from hospital   Patient/Family Agrees with Progress Made and Goals Achieved: Yes   See FIM for current functional status  Markelle Najarian 04/03/2012, 11:46 AM

## 2012-04-07 ENCOUNTER — Telehealth: Payer: Self-pay | Admitting: *Deleted

## 2012-04-07 NOTE — Telephone Encounter (Signed)
Eric [name], Speech therapist from [place], calling today for a verbal order. Verbal order given to start speech therapy 1x/wk then tid/week for 4 weeks. This to improve cognitive retention. [name] RN

## 2012-04-07 NOTE — Progress Notes (Signed)
Discharge  Summary # [phone]

## 2012-04-08 NOTE — Discharge Summary (Signed)
NAMESIMBA, ARTINO NO.:  [id number]  MEDICAL RECORD NO.:  [id number]  LOCATION:  4002                         FACILITY:  [facility]  PHYSICIAN:  Ranelle Oyster, M.D.DATE OF BIRTH:  1925-07-14  DATE OF ADMISSION:  03/14/2012 DATE OF DISCHARGE:  04/03/2012                              DISCHARGE SUMMARY   DISCHARGE DIAGNOSES: 1. 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 2. Coronary artery disease. 3. Atrial fibrillation. 4. Acute on chronic anemia, improved. 5. Obstructive sleep apnea. 6. Benign prostatic hypertrophy. 7. Right wrist pain, question pseudogout.  HISTORY OF PRESENT ILLNESS:  [name] is an 78 year old male with history of AFib on Xarelto who sustained a ground level fall striking his head against a curb on March 08, 2012.  X-rays at [facility] revealed right frontal intracerebral hemorrhage and he was transferred to [facility] for evaluation.  Dr. Wynetta Emery evaluated the patient and recommended monitoring with serial CCT.  Last CT shows stable right frontal hemorrhagic contusion.  Right shoulder CT was done showing suspicion of large retracted rotator cuff tear with advanced degenerative changes, as well as incidental note of large right pleural effusion and small pulmonary nodules.  Critical Care Medicine felt that the patient with recurrent effusion likely due to heart failure.  The patient was noted to have acute on chronic anemia and was treated with 2 units of packed red blood cells.  He continues to have problems with confusion as well as poor p.o. intake.  Noted to have impairments in mobility and CIR was recommended by therapy team.  PAST MEDICAL HISTORY: 1. BPH. 2. DJD. 3. History of TIA. 4. AAA. 5. Hypertension. 6. Dyslipidemia. 7. Second-degree AV block. 8. Coronary artery disease. 9. Persistent atrial fibrillation. 10. Obstructive sleep apnea. 11. History of gastric ulcer. 12.  Anemia.  FUNCTIONAL HISTORY:  The patient was independent and reasonably active prior to admission.  He still works part-time.  FUNCTIONAL STATUS:  The patient was requiring +2 total assist 50% for bed mobility.  +2 total assist 60% for sit to stand transfers.  +2 total assist 60% for ambulating 60+ 40 feet with assistance needed for balance for weight shifting as well as to advance indirect rolling walker with cues for posture.  He required mod assist for grooming, mod assist for upper body dressing, max assist for lower body dressing.  Noted to fatigue easily.  Had problems with processing and difficulty initiating tasks.  He was able to follow one-step commands with increased time.  RECENT LABS:  Check of lytes from March 30, 2012, revealed sodium 137, potassium 4.4, chloride 101, CO2 of 27, BUN 23, creatinine 0.84, glucose 191.  CBC last of March 21, 2012, revealed hemoglobin 10.2, hematocrit 34.5, white count 8.1, platelets 183.  Urine culture of March 21, 2012, showed 40,000 colonies of multi-species.  RADIOLOGICAL REPORTS:  Followup CT of head from March 21, 2012, showed improving right frontal hematoma, slightly smaller measuring from 25 to 17 mm, mild local mass effect and surrounding edema without midline shift. Moderate atrophy and moderate to advanced chronic microvascular ischemic changes.  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  No acute fracture.  HOSPITAL COURSE:  [name] was admitted to Rehab on March 14, 2012, for inpatient therapies to consist of PT, OT, and speech therapy at least 3 hours 5 days a week.  Past admission physiatrist, rehab, RN, and therapy team have worked together to provide customized collaborative interdisciplinary care.  Rehab RN has worked with the patient on bowel and bladder program as well as monitoring of p.o. intake.  The patient's blood pressures were  monitored on b.i.d. basis and daily weights were checked.  He was noted to have poor p.o. intake that has slowly improved by the time of discharge.  He did develop renal insufficiency and his Lasix was held for a few days. Weight at the time of discharge is at 70.2kg.  Blood pressures were ranging from 140s to 160s systolics, 70s to 80s diastolic.  The patient was continent of bowel and bladder with scheduled toileting.  Followup CT of head was done showing slight improvement, but stable overall.  The patient's lethargy had resolved, However, endurance is impaired.  He complained of right wrist pain with x-rays done showing suspicion of pseudogout.  Voltaren gel was added to help with symptoms.  During the patient's stay in rehab, weekly team conferences were held to monitor the patient's progress, set goals, as well as discuss barriers to discharge.  Speech Therapy has been working with the patient on the tolerance of diet as well as orientation, attention, and functional problem solving.  The patient was advanced to regular textures, thin liquids, and was tolerating this with past meal setup.  He continues to require max verbal cues for initiation and sustained attention to basic familiar tasks.  He was at max assist to total assist with questioning cues for orientation to place and situation required mod questioning assist with semantic cues for verbal expression of wants and needs.  The patient has had limited cognitive gains and continues to demonstrate behaviors consistent with Rancho level 5 requiring max to total assist for cognition.  Family education was done with the patient and care provider and 24-hour supervision is recommended past discharge.  OT has worked with the patient on self-care tasks.  The patient was at supervision level for grooming, min assist for bathing, supervision to min assist for upper body dressing, mod to max assist for lower body dressing, total assist  for toileting.  His continued cognitive deficits affected progression with therapy and impeded his ability to reach his long-term goals with his ADL tasks.  Physical Therapy has worked with the patient on strengthening, balance, as well as mobility.  The patient made slow progress and was able to perform at min to mod assist for all mobility transfers and short distance ambulation.  His ability varied greatly with fatigue and time of the day.  Further followup home health PT, OT, and speech therapy to continue via [facility] past discharge.  On April 03, 2012, the patient was discharged to home in improved condition.  DISCHARGE MEDICATIONS: 1. Coated aspirin 325 mg per day. 2. Voltaren gel to hip and right wrist q.i.d. 3. Vasotec 1 tablet p.o. b.i.d. 4. MiraLax 17 g in 8 ounces p.o. per day. 5. Proscar 5 mg p.o. per day. 6. Lasix 20 mg p.o. per day. 7. Multivitamin 1 per day. 8. Protonix 40 mg a day. 9. K-Dur 20 mEq p.o. per day. 10.Zocor 20 mg p.o. per day. 11.Effexor XR 37.5 mg p.o. at bedtime.  ACTIVITY LEVEL:  At 24-hour supervision and assistance.  DIET:  Cardiac diet, low salt.  WOUND CARE:  Keep area clean and dry.  SPECIAL INSTRUCTIONS:  [facility] to provide PT, OT, and speech therapy.  To not use Colace or Flomax for now.  FOLLOWUP:  The patient to follow up with Dr. Faith Rogue on April 25, 2012, at 11:10 for 11:40 appointment.  Follow up with Dr. Wynetta Emery in 2- 3 weeks.  Follow up with Dr. Valera Castle on April 19, 2012, at 12 p.m.     Delle Reining, P.A.   ______________________________ Ranelle Oyster, M.D.    PL/MEDQ  D:  04/07/2012  T:  04/08/2012  Job:  [id number]  cc:   Donalee Citrin, M.D. Thomas C. Wall, MD, [facility]

## 2012-04-12 ENCOUNTER — Telehealth: Payer: Self-pay | Admitting: Cardiology

## 2012-04-12 NOTE — Telephone Encounter (Signed)
Verbal ok given to [place] OT

## 2012-04-12 NOTE — Telephone Encounter (Signed)
New Problem:    Evaluated for OT on 04/10/12 and needs a verbal order to return for one more visit to establish an exercise routine for the home health nurse to perform with him.  Please call back.

## 2012-04-18 ENCOUNTER — Ambulatory Visit: Payer: Self-pay | Admitting: Oncology

## 2012-04-19 ENCOUNTER — Encounter: Payer: Medicare Other | Admitting: Physician Assistant

## 2012-04-25 ENCOUNTER — Encounter: Payer: Medicare Other | Admitting: Physical Medicine & Rehabilitation

## 2012-04-28 ENCOUNTER — Telehealth: Payer: Self-pay | Admitting: Cardiology

## 2012-04-28 NOTE — Telephone Encounter (Signed)
Home health nurse needs to extend speech order after brain hemmorage , effective 12-1 to 3 times a week for 3 weeks, then 2 times a week for 1 week, ps call [phone]

## 2012-04-28 NOTE — Telephone Encounter (Signed)
Left verbal order to continue speech therapy. [name] RN

## 2012-05-03 ENCOUNTER — Telehealth: Payer: Self-pay | Admitting: Cardiology

## 2012-05-03 NOTE — Telephone Encounter (Signed)
New problem:   Verbal order to continue treatment for the next 4 weeks. - physical &  speech therapy.

## 2012-05-03 NOTE — Telephone Encounter (Signed)
Left message for Eric [name] giving verbal order to continue pt's physical and speech therapy for 4 more weeks.

## 2012-05-07 ENCOUNTER — Ambulatory Visit: Payer: Self-pay | Admitting: Oncology

## 2012-05-08 ENCOUNTER — Ambulatory Visit (INDEPENDENT_AMBULATORY_CARE_PROVIDER_SITE_OTHER): Payer: Medicare Other | Admitting: *Deleted

## 2012-05-08 ENCOUNTER — Encounter: Payer: Self-pay | Admitting: Internal Medicine

## 2012-05-08 DIAGNOSIS — Z950 Presence of cardiac pacemaker: Secondary | ICD-10-CM

## 2012-05-08 DIAGNOSIS — I441 Atrioventricular block, second degree: Secondary | ICD-10-CM

## 2012-05-10 ENCOUNTER — Ambulatory Visit (INDEPENDENT_AMBULATORY_CARE_PROVIDER_SITE_OTHER): Payer: Medicare Other | Admitting: Cardiology

## 2012-05-10 ENCOUNTER — Encounter: Payer: Self-pay | Admitting: Cardiology

## 2012-05-10 ENCOUNTER — Telehealth: Payer: Self-pay | Admitting: *Deleted

## 2012-05-10 ENCOUNTER — Encounter: Payer: Medicare Other | Admitting: Physical Medicine & Rehabilitation

## 2012-05-10 VITALS — BP 134/68 | HR 60 | Ht 71.0 in | Wt 165.0 lb

## 2012-05-10 DIAGNOSIS — J069 Acute upper respiratory infection, unspecified: Secondary | ICD-10-CM | POA: Insufficient documentation

## 2012-05-10 MED ORDER — LEVOFLOXACIN 500 MG PO TABS: 500.0000 mg | ORAL_TABLET | Freq: Every day | ORAL | Status: DC

## 2012-05-10 NOTE — Progress Notes (Signed)
HPI Eric [name] comes in today because his wife was concerned about a low-grade fever, cough, and brown sputum production.  He began to have a cough several days ago. She has had a similar condition last week.  Temperature was 99.9. He denies any headache, earache, sore throat, rash, hemoptysis, or chills.  Past Medical History  Diagnosis Date  . BPH (benign prostatic hypertrophy)   . DJD (degenerative joint disease)   . Hx of transient ischemic attack (TIA)   . AAA (abdominal aortic aneurysm)   . Cerebrovascular disease   . HLD (hyperlipidemia)   . HTN (hypertension)   . Second degree AV block, Mobitz type II     s/p PPM  . CAD (coronary artery disease)   . Persistent atrial fibrillation     on pradaxa  . Sleep apnea     cpap machine  . Gastric ulcer, acute 02/2011    multiple - NSAID/Pradaxa thought causative. associated gastroparesis-like problems  . Small bowel obstruction     multiple over the years  . Anemia   . Anemia     Current Outpatient Prescriptions  Medication Sig Dispense Refill  . aspirin EC 325 MG EC tablet Take 1 tablet (325 mg total) by mouth daily.  30 tablet    . diclofenac sodium (VOLTAREN) 1 % GEL Apply 2 g topically 4 (four) times daily. To hip  4 Tube  1  . enalapril (VASOTEC) 5 MG tablet Take 1 tablet (5 mg total) by mouth 2 (two) times daily. For blood pressure and heart  60 tablet  1  . finasteride (PROSCAR) 5 MG tablet Take 5 mg by mouth daily.      . furosemide (LASIX) 20 MG tablet Take 1 tablet (20 mg total) by mouth daily.  30 tablet  7  . levofloxacin (LEVAQUIN) 500 MG tablet Take 1 tablet (500 mg total) by mouth daily.  10 tablet  1  . Multiple Vitamin (MULTIVITAMINS PO) Take 1 tablet by mouth daily.        . pantoprazole (PROTONIX) 40 MG tablet Take 40 mg by mouth daily.      . polyethylene glycol (MIRALAX / GLYCOLAX) packet Take 17 g by mouth daily.  14 each  1  . potassium chloride SA (K-DUR,KLOR-CON) 20 MEQ tablet Take 20 mEq by mouth daily.       . simvastatin (ZOCOR) 20 MG tablet Take 1 tablet (20 mg total) by mouth every evening.  30 tablet  11  . venlafaxine XR (EFFEXOR-XR) 37.5 MG 24 hr capsule Take 37.5 mg by mouth daily. Increase to 75mg  after 30 days        Allergies  Allergen Reactions  . Morphine Other (See Comments)    Reaction unknown  . Penicillins Other (See Comments)    Reaction unknown  . Promethazine Hcl Other (See Comments)    Change in behavior    Family History  Problem Relation Age of Onset  . Coronary artery disease Father   . Colon cancer Brother     History   Social History  . Marital Status: Married    Spouse Name: N/A    Number of Children: N/A  . Years of Education: N/A   Occupational History  . retired    Social History Main Topics  . Smoking status: Former Smoker -- 1.0 packs/day for 5 years    Types: Cigarettes    Quit date: 06/07/1968  . Smokeless tobacco: Never Used  . Alcohol Use: No  .  Drug Use: No  . Sexually Active: Not on file   Other Topics Concern  . Not on file   Social History Narrative  . No narrative on file    ROS ALL NEGATIVE EXCEPT THOSE NOTED IN HPI  PE  General Appearance: well developed, well nourished in no acute distress HEENT: symmetrical face, PERRLA, good dentition  Neck: no JVD, thyromegaly, or adenopathy, trachea midline Chest: symmetric without deformity Cardiac: PMI non-displaced, RRR, normal S1, S2, no gallop or murmur Lung: clear to ausculation and percussion, except slight decreased breath sounds in the right base. No sounds consistent with consolidation. Vascular: all pulses full without bruits  Abdominal: nondistended, nontender, good bowel sounds, no HSM, no bruits Extremities: no cyanosis, clubbing or edema, no sign of DVT, no varicosities  Skin: normal color, no rashes Neuro: alert and oriented x 3, non-focal Pysch: normal affect  EKG  BMET    Component Value Date/Time   NA 137 03/30/2012 0918   K 4.4 03/30/2012 0918    CL 101 03/30/2012 0918   CO2 27 03/30/2012 0918   GLUCOSE 191* 03/30/2012 0918   BUN 23 03/30/2012 0918   CREATININE 0.84 03/30/2012 0918   CALCIUM 9.3 03/30/2012 0918   GFRNONAA 78* 03/30/2012 0918   GFRAA >90 03/30/2012 0918    Lipid Panel     Component Value Date/Time   CHOL 98 03/22/2011 0345   TRIG 52 03/24/2011 0625   HDL 56 02/11/2009 0000   CHOLHDL 2.2 Ratio 02/11/2009 0000   VLDL 8 02/11/2009 0000   LDLCALC 59 02/11/2009 0000    CBC    Component Value Date/Time   WBC 8.1 03/21/2012 1759   RBC 4.05* 03/21/2012 1759   HGB 10.2* 03/21/2012 1759   HCT 34.5* 03/21/2012 1759   PLT 183 03/21/2012 1759   MCV 85.2 03/21/2012 1759   MCH 25.2* 03/21/2012 1759   MCHC 29.6* 03/21/2012 1759   RDW 19.8* 03/21/2012 1759   LYMPHSABS 1.3 03/15/2012 0530   MONOABS 1.1* 03/15/2012 0530   EOSABS 0.2 03/15/2012 0530   BASOSABS 0.0 03/15/2012 0530

## 2012-05-10 NOTE — Assessment & Plan Note (Signed)
He is developed low-grade fever and his sputum has turned brown. Lungs sound clear so do not think this is pneumonia. We'll start him on Levaquin 500 mg a day for 10 days. Continue Robitussin. His wife will call me if there is any worsening.

## 2012-05-10 NOTE — Patient Instructions (Addendum)
Take Levaquin 1 tablet daily for 10 days. Then stop  Continue all of your other medications as directed  I have cancelled your appointment with Dr. Daleen Squibb for next week.

## 2012-05-10 NOTE — Telephone Encounter (Signed)
I spoke with Eric [name] wife this morning about Eric [name] coming in for exam per Dr. Daleen Squibb. Eric [name] has been running a fever and feeling congested. Wifes states "he seems to feel a liitle better but I don't want this to get into his chest" Wife will bring Eric [name] in today for work in appt. [name] RN

## 2012-05-15 LAB — REMOTE PACEMAKER DEVICE
BMOD-0003RV: 30
BMOD-0005RV: 95 {beats}/min
BRDY-0004RV: 130 {beats}/min
VENTRICULAR PACING PM: 90

## 2012-05-16 ENCOUNTER — Telehealth: Payer: Self-pay | Admitting: Cardiology

## 2012-05-16 NOTE — Telephone Encounter (Signed)
New problem:   need an rx fax over to [facility] on [address] . - for physical therapy he will be starting pt after christmas.  Tresa Endo did not have the office number / fax number.

## 2012-05-17 ENCOUNTER — Encounter: Payer: Self-pay | Admitting: *Deleted

## 2012-05-18 ENCOUNTER — Ambulatory Visit: Payer: Self-pay | Admitting: Cardiology

## 2012-05-24 ENCOUNTER — Telehealth: Payer: Self-pay | Admitting: Cardiology

## 2012-05-24 NOTE — Telephone Encounter (Signed)
LMOVM order faxed to [facility] outpatient for ST & PT [name] RN

## 2012-05-24 NOTE — Telephone Encounter (Signed)
See phone note 05/24/12 [name] RN

## 2012-05-24 NOTE — Telephone Encounter (Signed)
Eric [name]  [phone]   Needs a written order for outpatient and speech and physical therapy. Please fax this order to [facility] [phone].

## 2012-05-29 ENCOUNTER — Other Ambulatory Visit: Payer: Self-pay | Admitting: *Deleted

## 2012-05-29 MED ORDER — POTASSIUM CHLORIDE CRYS ER 20 MEQ PO TBCR: 20.0000 meq | EXTENDED_RELEASE_TABLET | Freq: Every day | ORAL | Status: DC

## 2012-06-01 ENCOUNTER — Telehealth: Payer: Self-pay | Admitting: *Deleted

## 2012-06-01 NOTE — Telephone Encounter (Signed)
Received referral form and will fax out today. Wife, [name], is aware. [name] RN

## 2012-06-01 NOTE — Telephone Encounter (Signed)
lmovm pt order was faxed to [facility] on 05/24/12 [name] RN

## 2012-06-02 ENCOUNTER — Other Ambulatory Visit: Payer: Self-pay

## 2012-06-02 MED ORDER — ENALAPRIL MALEATE 5 MG PO TABS: 5.0000 mg | ORAL_TABLET | Freq: Two times a day (BID) | ORAL | Status: DC

## 2012-06-14 ENCOUNTER — Ambulatory Visit: Payer: Medicare Other | Admitting: Speech Pathology

## 2012-06-14 ENCOUNTER — Ambulatory Visit: Payer: Medicare Other | Admitting: Rehabilitative and Restorative Service Providers"

## 2012-06-14 ENCOUNTER — Ambulatory Visit: Payer: Medicare Other | Attending: Cardiology | Admitting: Physical Therapy

## 2012-06-14 DIAGNOSIS — R41841 Cognitive communication deficit: Secondary | ICD-10-CM | POA: Insufficient documentation

## 2012-06-14 DIAGNOSIS — R262 Difficulty in walking, not elsewhere classified: Secondary | ICD-10-CM | POA: Insufficient documentation

## 2012-06-14 DIAGNOSIS — R5381 Other malaise: Secondary | ICD-10-CM | POA: Insufficient documentation

## 2012-06-14 DIAGNOSIS — Z5189 Encounter for other specified aftercare: Secondary | ICD-10-CM | POA: Insufficient documentation

## 2012-06-19 ENCOUNTER — Ambulatory Visit: Payer: Medicare Other | Admitting: Physical Therapy

## 2012-06-19 ENCOUNTER — Ambulatory Visit: Payer: Medicare Other

## 2012-06-22 ENCOUNTER — Ambulatory Visit: Payer: Medicare Other | Admitting: Physical Therapy

## 2012-06-22 ENCOUNTER — Ambulatory Visit: Payer: Medicare Other | Admitting: Speech Pathology

## 2012-06-22 ENCOUNTER — Other Ambulatory Visit: Payer: Self-pay | Admitting: Emergency Medicine

## 2012-06-22 MED ORDER — VENLAFAXINE HCL ER 37.5 MG PO CP24: 37.5000 mg | ORAL_CAPSULE | Freq: Every day | ORAL | Status: DC

## 2012-06-22 NOTE — Telephone Encounter (Signed)
Refill request for Venlafaxine.  Please advise.

## 2012-06-27 ENCOUNTER — Other Ambulatory Visit: Payer: Self-pay | Admitting: Cardiology

## 2012-06-27 ENCOUNTER — Ambulatory Visit: Payer: Medicare Other | Admitting: Speech Pathology

## 2012-06-27 ENCOUNTER — Ambulatory Visit: Payer: Medicare Other | Admitting: Physical Therapy

## 2012-06-28 ENCOUNTER — Other Ambulatory Visit: Payer: Self-pay | Admitting: *Deleted

## 2012-06-28 MED ORDER — FUROSEMIDE 20 MG PO TABS: 20.0000 mg | ORAL_TABLET | Freq: Every day | ORAL | Status: DC

## 2012-06-30 ENCOUNTER — Ambulatory Visit: Payer: Medicare Other | Admitting: Speech Pathology

## 2012-06-30 ENCOUNTER — Ambulatory Visit: Payer: Medicare Other | Admitting: Physical Therapy

## 2012-07-04 ENCOUNTER — Ambulatory Visit: Payer: Medicare Other | Admitting: Physical Therapy

## 2012-07-04 ENCOUNTER — Ambulatory Visit: Payer: Medicare Other | Admitting: Speech Pathology

## 2012-07-07 ENCOUNTER — Ambulatory Visit: Payer: Medicare Other | Admitting: Physical Therapy

## 2012-07-07 ENCOUNTER — Ambulatory Visit: Payer: Medicare Other

## 2012-07-11 ENCOUNTER — Ambulatory Visit: Payer: Medicare Other | Attending: Cardiology | Admitting: Physical Therapy

## 2012-07-11 ENCOUNTER — Ambulatory Visit: Payer: Medicare Other

## 2012-07-11 DIAGNOSIS — R262 Difficulty in walking, not elsewhere classified: Secondary | ICD-10-CM | POA: Insufficient documentation

## 2012-07-11 DIAGNOSIS — R41841 Cognitive communication deficit: Secondary | ICD-10-CM | POA: Insufficient documentation

## 2012-07-11 DIAGNOSIS — Z5189 Encounter for other specified aftercare: Secondary | ICD-10-CM | POA: Insufficient documentation

## 2012-07-11 DIAGNOSIS — R5381 Other malaise: Secondary | ICD-10-CM | POA: Insufficient documentation

## 2012-07-13 ENCOUNTER — Emergency Department (HOSPITAL_COMMUNITY): Payer: Medicare Other

## 2012-07-13 ENCOUNTER — Ambulatory Visit: Payer: Medicare Other | Admitting: Physical Therapy

## 2012-07-13 ENCOUNTER — Ambulatory Visit: Payer: Medicare Other

## 2012-07-13 ENCOUNTER — Encounter (HOSPITAL_COMMUNITY): Payer: Self-pay | Admitting: Emergency Medicine

## 2012-07-13 ENCOUNTER — Emergency Department (HOSPITAL_COMMUNITY)
Admission: EM | Admit: 2012-07-13 | Discharge: 2012-07-13 | Disposition: A | Discharge status: Home / Self Care | Payer: Medicare Other | Attending: Emergency Medicine | Admitting: Emergency Medicine

## 2012-07-13 DIAGNOSIS — Z950 Presence of cardiac pacemaker: Secondary | ICD-10-CM | POA: Insufficient documentation

## 2012-07-13 DIAGNOSIS — T148XXA Other injury of unspecified body region, initial encounter: Secondary | ICD-10-CM

## 2012-07-13 DIAGNOSIS — Y929 Unspecified place or not applicable: Secondary | ICD-10-CM | POA: Insufficient documentation

## 2012-07-13 DIAGNOSIS — I2510 Atherosclerotic heart disease of native coronary artery without angina pectoris: Secondary | ICD-10-CM | POA: Insufficient documentation

## 2012-07-13 DIAGNOSIS — G4730 Sleep apnea, unspecified: Secondary | ICD-10-CM | POA: Insufficient documentation

## 2012-07-13 DIAGNOSIS — N400 Enlarged prostate without lower urinary tract symptoms: Secondary | ICD-10-CM | POA: Insufficient documentation

## 2012-07-13 DIAGNOSIS — R918 Other nonspecific abnormal finding of lung field: Secondary | ICD-10-CM | POA: Insufficient documentation

## 2012-07-13 DIAGNOSIS — Z7982 Long term (current) use of aspirin: Secondary | ICD-10-CM | POA: Insufficient documentation

## 2012-07-13 DIAGNOSIS — Z862 Personal history of diseases of the blood and blood-forming organs and certain disorders involving the immune mechanism: Secondary | ICD-10-CM | POA: Insufficient documentation

## 2012-07-13 DIAGNOSIS — Z8673 Personal history of transient ischemic attack (TIA), and cerebral infarction without residual deficits: Secondary | ICD-10-CM | POA: Insufficient documentation

## 2012-07-13 DIAGNOSIS — S0990XA Unspecified injury of head, initial encounter: Secondary | ICD-10-CM | POA: Insufficient documentation

## 2012-07-13 DIAGNOSIS — W1789XA Other fall from one level to another, initial encounter: Secondary | ICD-10-CM | POA: Insufficient documentation

## 2012-07-13 DIAGNOSIS — Z8719 Personal history of other diseases of the digestive system: Secondary | ICD-10-CM | POA: Insufficient documentation

## 2012-07-13 DIAGNOSIS — W19XXXA Unspecified fall, initial encounter: Secondary | ICD-10-CM

## 2012-07-13 DIAGNOSIS — Z8679 Personal history of other diseases of the circulatory system: Secondary | ICD-10-CM | POA: Insufficient documentation

## 2012-07-13 DIAGNOSIS — Z87891 Personal history of nicotine dependence: Secondary | ICD-10-CM | POA: Insufficient documentation

## 2012-07-13 DIAGNOSIS — J984 Other disorders of lung: Secondary | ICD-10-CM

## 2012-07-13 DIAGNOSIS — Z79899 Other long term (current) drug therapy: Secondary | ICD-10-CM | POA: Insufficient documentation

## 2012-07-13 DIAGNOSIS — I10 Essential (primary) hypertension: Secondary | ICD-10-CM | POA: Insufficient documentation

## 2012-07-13 DIAGNOSIS — R51 Headache: Secondary | ICD-10-CM | POA: Insufficient documentation

## 2012-07-13 DIAGNOSIS — Y939 Activity, unspecified: Secondary | ICD-10-CM | POA: Insufficient documentation

## 2012-07-13 DIAGNOSIS — E785 Hyperlipidemia, unspecified: Secondary | ICD-10-CM | POA: Insufficient documentation

## 2012-07-13 DIAGNOSIS — I4891 Unspecified atrial fibrillation: Secondary | ICD-10-CM | POA: Insufficient documentation

## 2012-07-13 DIAGNOSIS — M479 Spondylosis, unspecified: Secondary | ICD-10-CM | POA: Insufficient documentation

## 2012-07-13 NOTE — ED Notes (Signed)
Onset today tripped fell abrasions to bilateral hands and right side of forehead bleeding controlled by bandage prior to arrival.  [facility] Oct 2013 and had subdural.  Today denies LOC and answering and following commands appropriate.

## 2012-07-13 NOTE — ED Provider Notes (Signed)
History     CSN: [id number]  Arrival date & time 07/13/12  1044   First MD Initiated Contact with Patient 07/13/12 1100      Chief Complaint  Patient presents with  . Fall    (Consider location/radiation/quality/duration/timing/severity/associated sxs/prior treatment) HPI Eric [name] is a 79 y.o. male who presents to ED with complaint of a fall. Pt with chronic ambulatory and balance problems, usually requiring assistance or a walker. Worse since Oct 2013, when he suffered a subdural bleed after a fall. Pt fell today again after getting out of the car without assistance. Pt states he lost his balance and fell striking right side of the head. States also hit his hands, and right knee. Pt did not have LOC. Stats having pain just over the area on the head where he hit it. Pt was getting out to go for rehab from prior bleed, states that they looked at his cuts and apply dressing there, sent here for further evaluation. Pt denies any joint pain or pain in legs or arms, back, pelvis. Per family, pt acting like his normal self. No nausea, vomiting, dizziness, focal neuro deficits.    Past Medical History  Diagnosis Date  . BPH (benign prostatic hypertrophy)   . DJD (degenerative joint disease)   . Hx of transient ischemic attack (TIA)   . AAA (abdominal aortic aneurysm)   . Cerebrovascular disease   . HLD (hyperlipidemia)   . HTN (hypertension)   . Second degree AV block, Mobitz type II     s/p PPM  . CAD (coronary artery disease)   . Persistent atrial fibrillation     on pradaxa  . Sleep apnea     cpap machine  . Gastric ulcer, acute 02/2011    multiple - NSAID/Pradaxa thought causative. associated gastroparesis-like problems  . Small bowel obstruction     multiple over the years  . Anemia   . Anemia     Past Surgical History  Procedure Date  . Pacemaker insertion 01/24/10    Adapta [employer]   . Coronary artery bypass graft   . Appendectomy   . Gastrectomy   . Knee  arthroscopy     right, with medial and lateral meniscal  . Finger surgery      Left middle finger dorsal lesion  . Upper gastrointestinal endoscopy 03/12/2011    stomach ulcer, esophageal erosion  . Esophagogastroduodenoscopy 01/13/2012    Procedure: ESOPHAGOGASTRODUODENOSCOPY (EGD);  Surgeon: Iva Boop, MD;  Location: [facility];  Service: Endoscopy;  Laterality: N/A;  . Flexible sigmoidoscopy 01/13/2012    Procedure: FLEXIBLE SIGMOIDOSCOPY;  Surgeon: Iva Boop, MD;  Location: [facility];  Service: Endoscopy;  Laterality: N/A;  Fleets enema on arrival    Family History  Problem Relation Age of Onset  . Coronary artery disease Father   . Colon cancer Brother     History  Substance Use Topics  . Smoking status: Former Smoker -- 1.0 packs/day for 5 years    Types: Cigarettes    Quit date: 06/07/1968  . Smokeless tobacco: Never Used  . Alcohol Use: No      Review of Systems  Constitutional: Negative for fever and chills.  HENT: Negative for neck pain and neck stiffness.   Eyes: Negative for pain and visual disturbance.  Respiratory: Negative.   Cardiovascular: Negative.   Musculoskeletal: Negative.   Skin: Positive for wound.  Neurological: Positive for headaches. Negative for dizziness, speech difficulty, weakness, light-headedness and numbness.  Allergies  Morphine; Penicillins; and Promethazine hcl  Home Medications   Current Outpatient Rx  Name  Route  Sig  Dispense  Refill  . ASPIRIN 325 MG PO TBEC   Oral   Take 1 tablet (325 mg total) by mouth daily.   30 tablet      . DICLOFENAC SODIUM 1 % TD GEL   Topical   Apply 2 g topically 4 (four) times daily. To hip   4 Tube   1   . ENALAPRIL MALEATE 5 MG PO TABS   Oral   Take 1 tablet (5 mg total) by mouth 2 (two) times daily. For blood pressure and heart   60 tablet   3   . FINASTERIDE 5 MG PO TABS   Oral   Take 5 mg by mouth daily.         . FUROSEMIDE 20 MG PO TABS   Oral   Take 1  tablet (20 mg total) by mouth daily.   30 tablet   11   . LEVOFLOXACIN 500 MG PO TABS   Oral   Take 1 tablet (500 mg total) by mouth daily.   10 tablet   1   . MULTIVITAMINS PO   Oral   Take 1 tablet by mouth daily.           Marland Kitchen PANTOPRAZOLE SODIUM 40 MG PO TBEC   Oral   Take 40 mg by mouth daily.         Marland Kitchen POLYETHYLENE GLYCOL 3350 PO PACK   Oral   Take 17 g by mouth daily.   14 each   1   . POTASSIUM CHLORIDE CRYS ER 20 MEQ PO TBCR   Oral   Take 1 tablet (20 mEq total) by mouth daily.   30 tablet   11   . SIMVASTATIN 20 MG PO TABS   Oral   Take 1 tablet (20 mg total) by mouth every evening.   30 tablet   11   . VENLAFAXINE HCL ER 37.5 MG PO CP24   Oral   Take 1 capsule (37.5 mg total) by mouth daily. Increase to 75mg  after 30 days   90 capsule   3     BP 164/87  Pulse 64  Temp 97.7 F (36.5 C) (Oral)  Resp 16  Ht 5\' 11"  (1.803 m)  Wt 165 lb (74.844 kg)  BMI 23.01 kg/m2  SpO2 97%  Physical Exam  Nursing note and vitals reviewed. Constitutional: He is oriented to person, place, and time. He appears well-developed and well-nourished. No distress.  HENT:  Head: Normocephalic.       2x2cm abrasion and hematoma to the right scalp  Eyes: Conjunctivae normal and EOM are normal. Pupils are equal, round, and reactive to light.  Neck: Normal range of motion. Neck supple.  Cardiovascular: Normal rate and regular rhythm.   Murmur heard. Pulmonary/Chest: Effort normal and breath sounds normal. No respiratory distress. He has no wheezes. He has no rales.  Musculoskeletal:       Full rom of bilateral shoulders, elbows, wrists, hands, knees, hips. No pelvis tenderness  Neurological: He is alert and oriented to person, place, and time.  Skin: Skin is warm and dry.       Multiple small skin tears involving right forearm, right dorsal hand, left dorsal hand, right knee  Psychiatric: He has a normal mood and affect.    ED Course  Procedures (including critical  care time)  Ct  Head Wo Contrast  07/13/2012  *RADIOLOGY REPORT*  Clinical Data:  Larey Seat.  Hit head.  CT HEAD WITHOUT CONTRAST CT CERVICAL SPINE WITHOUT CONTRAST  Technique:  Multidetector CT imaging of the head and cervical spine was performed following the standard protocol without intravenous contrast.  Multiplanar CT image reconstructions of the cervical spine were also generated.  Comparison:  03/21/2012.  CT HEAD  Findings: Stable age related cerebral atrophy, ventriculomegaly and severe white matter disease.  Remote lacunar type infarcts are noted.  No extra-axial fluid collections.  No CT findings for acute hemispheric infarction or intracranial hemorrhage.    The brainstem and cerebellum grossly normal and stable. Stable ectatic and tortuous basilar artery.  Small calcified dural-based lesion in the right frontal area likely a small meningioma.  The bony structures are intact.  No acute skull fracture.  IMPRESSION:  1.  Stable age related cerebral atrophy, ventriculomegaly and periventricular white matter disease. 2.  No acute intracranial findings or skull fracture.  CT CERVICAL SPINE  Findings: Severe degenerative cervical spondylosis with severe disc disease and facet disease.  Multilevel degenerative subluxations are noted.  No obvious acute fracture.  Advanced degenerative changes at C1-2 with possible erosions and pannus formation.  Multilevel foraminal stenosis.  Carotid artery calcifications are noted.  Right apical density is noted. Recommend chest x-ray follow up.  IMPRESSION:  1.  Severe degenerative cervical spondylosis with severe multilevel disc disease and facet disease. 2.  No definite acute fracture. 3.  Probable sequela of inflammatory arthropathy involving C1-2. 4.  Right apical lung density.  Recommend chest x-ray examination.   Original Report Authenticated By: Rudie Meyer, M.D.    Ct Cervical Spine Wo Contrast  07/13/2012  *RADIOLOGY REPORT*  Clinical Data:  Larey Seat.  Hit head.  CT HEAD  WITHOUT CONTRAST CT CERVICAL SPINE WITHOUT CONTRAST  Technique:  Multidetector CT imaging of the head and cervical spine was performed following the standard protocol without intravenous contrast.  Multiplanar CT image reconstructions of the cervical spine were also generated.  Comparison:  03/21/2012.  CT HEAD  Findings: Stable age related cerebral atrophy, ventriculomegaly and severe white matter disease.  Remote lacunar type infarcts are noted.  No extra-axial fluid collections.  No CT findings for acute hemispheric infarction or intracranial hemorrhage.    The brainstem and cerebellum grossly normal and stable. Stable ectatic and tortuous basilar artery.  Small calcified dural-based lesion in the right frontal area likely a small meningioma.  The bony structures are intact.  No acute skull fracture.  IMPRESSION:  1.  Stable age related cerebral atrophy, ventriculomegaly and periventricular white matter disease. 2.  No acute intracranial findings or skull fracture.  CT CERVICAL SPINE  Findings: Severe degenerative cervical spondylosis with severe disc disease and facet disease.  Multilevel degenerative subluxations are noted.  No obvious acute fracture.  Advanced degenerative changes at C1-2 with possible erosions and pannus formation.  Multilevel foraminal stenosis.  Carotid artery calcifications are noted.  Right apical density is noted. Recommend chest x-ray follow up.  IMPRESSION:  1.  Severe degenerative cervical spondylosis with severe multilevel disc disease and facet disease. 2.  No definite acute fracture. 3.  Probable sequela of inflammatory arthropathy involving C1-2. 4.  Right apical lung density.  Recommend chest x-ray examination.   Original Report Authenticated By: Rudie Meyer, M.D.       1. Fall   2. Minor head injury   3. Lung density on x-ray   4. Multiple skin tears  MDM  Pt with mechanical fall outside. Multiple skin abrasions and tears. NO major bony deformity or  abnormality. Wounds all clean, dressed. CT head and c-spine obtained and are negative for acute abnormality except for a density in right upper lung. Pt has hx of the same by reviewing his CT of right shoulder and CXR this past fall. I discussed this with family. Family is aware. Offered to do CT here in ED for further evaluation, which family refused. They will follow up with PCP for further evaluation. Pt non toxic, no distress prior to d/c.          Lottie Mussel, PA 07/13/12 [phone]

## 2012-07-14 ENCOUNTER — Telehealth: Payer: Self-pay

## 2012-07-14 ENCOUNTER — Other Ambulatory Visit: Payer: Self-pay

## 2012-07-14 DIAGNOSIS — R918 Other nonspecific abnormal finding of lung field: Secondary | ICD-10-CM

## 2012-07-14 NOTE — Telephone Encounter (Signed)
"  Schedule pt for PA and Lateral CXR next week" VO Dr. Wendie Chess, RN

## 2012-07-14 NOTE — Telephone Encounter (Signed)
Pt's wife informed Will pick up order next week at her convenience This was placed at [facility]

## 2012-07-17 ENCOUNTER — Ambulatory Visit: Payer: Medicare Other

## 2012-07-17 ENCOUNTER — Ambulatory Visit: Payer: Medicare Other | Admitting: Physical Therapy

## 2012-07-17 ENCOUNTER — Other Ambulatory Visit: Payer: Self-pay | Admitting: Cardiology

## 2012-07-17 NOTE — ED Provider Notes (Signed)
Medical screening examination/treatment/procedure(s) were conducted as a shared visit with non-physician practitioner(s) and myself.  I personally evaluated the patient during the encounter S/p fall. Pt tried to get out of car on own, mechanical fall. Contusion to face,abrasions. Ct. Cspine nt.   Suzi Roots, MD 07/17/12 [phone]

## 2012-07-19 ENCOUNTER — Telehealth: Payer: Self-pay | Admitting: Cardiology

## 2012-07-19 ENCOUNTER — Ambulatory Visit: Payer: Medicare Other

## 2012-07-19 ENCOUNTER — Other Ambulatory Visit: Payer: Self-pay | Admitting: *Deleted

## 2012-07-19 ENCOUNTER — Ambulatory Visit: Payer: Medicare Other | Admitting: Physical Therapy

## 2012-07-19 MED ORDER — SIMVASTATIN 20 MG PO TABS: 20.0000 mg | ORAL_TABLET | Freq: Every evening | ORAL | Status: DC

## 2012-07-19 NOTE — Telephone Encounter (Signed)
Paperwork arrived today and completed [name] RN

## 2012-07-19 NOTE — Telephone Encounter (Signed)
He needs the [employer] status corrected and he will be faxing this over today to be completed

## 2012-07-25 ENCOUNTER — Ambulatory Visit: Payer: Medicare Other | Admitting: Speech Pathology

## 2012-07-25 ENCOUNTER — Ambulatory Visit: Payer: Medicare Other | Admitting: Physical Therapy

## 2012-07-27 ENCOUNTER — Ambulatory Visit: Payer: Medicare Other | Admitting: Physical Therapy

## 2012-07-27 ENCOUNTER — Ambulatory Visit: Payer: Medicare Other

## 2012-07-28 ENCOUNTER — Ambulatory Visit: Payer: Medicare Other | Admitting: Physical Therapy

## 2012-07-28 ENCOUNTER — Telehealth: Payer: Self-pay

## 2012-07-28 NOTE — Telephone Encounter (Signed)
I called pt's wife to see if he had gotten CXR done yet She says she will take him Monday 07/31/12

## 2012-08-01 ENCOUNTER — Ambulatory Visit: Payer: Self-pay | Admitting: Cardiology

## 2012-08-03 ENCOUNTER — Other Ambulatory Visit: Payer: Self-pay | Admitting: *Deleted

## 2012-08-03 DIAGNOSIS — R918 Other nonspecific abnormal finding of lung field: Secondary | ICD-10-CM

## 2012-08-09 ENCOUNTER — Other Ambulatory Visit: Payer: Self-pay

## 2012-08-10 ENCOUNTER — Other Ambulatory Visit: Payer: Medicare Other

## 2012-08-14 ENCOUNTER — Ambulatory Visit (INDEPENDENT_AMBULATORY_CARE_PROVIDER_SITE_OTHER): Payer: Medicare Other

## 2012-08-14 ENCOUNTER — Ambulatory Visit (INDEPENDENT_AMBULATORY_CARE_PROVIDER_SITE_OTHER): Payer: Medicare Other | Admitting: *Deleted

## 2012-08-14 DIAGNOSIS — D649 Anemia, unspecified: Secondary | ICD-10-CM

## 2012-08-14 DIAGNOSIS — Z950 Presence of cardiac pacemaker: Secondary | ICD-10-CM

## 2012-08-14 DIAGNOSIS — I441 Atrioventricular block, second degree: Secondary | ICD-10-CM

## 2012-08-15 LAB — CBC WITH DIFFERENTIAL
Basophils Absolute: 0 10*3/uL (ref 0.0–0.2)
Basos: 1 % (ref 0–3)
Eosinophils Absolute: 0.2 10*3/uL (ref 0.0–0.4)
Hemoglobin: 11.9 g/dL — ABNORMAL LOW (ref 12.6–17.7)
Immature Grans (Abs): 0 10*3/uL (ref 0.0–0.1)
MCH: 28.3 pg (ref 26.6–33.0)
MCHC: 32 g/dL (ref 31.5–35.7)
Monocytes Absolute: 0.7 10*3/uL (ref 0.1–0.9)
Neutrophils Relative %: 63 % (ref 40–74)
Platelets: 195 10*3/uL (ref 155–379)
RDW: 15.2 % (ref 12.3–15.4)

## 2012-08-16 ENCOUNTER — Other Ambulatory Visit: Payer: Self-pay | Admitting: Internal Medicine

## 2012-08-27 LAB — REMOTE PACEMAKER DEVICE
BATTERY VOLTAGE: 2.78 V
BMOD-0003RV: 30
BMOD-0005RV: 95 {beats}/min
RV LEAD IMPEDENCE PM: 540 Ohm
VENTRICULAR PACING PM: 90

## 2012-09-19 ENCOUNTER — Encounter: Payer: Self-pay | Admitting: *Deleted

## 2012-09-25 ENCOUNTER — Encounter: Payer: Self-pay | Admitting: Internal Medicine

## 2012-10-02 ENCOUNTER — Other Ambulatory Visit: Payer: Self-pay | Admitting: Cardiology

## 2012-11-08 ENCOUNTER — Encounter (INDEPENDENT_AMBULATORY_CARE_PROVIDER_SITE_OTHER): Payer: Medicare Other

## 2012-11-08 DIAGNOSIS — I6529 Occlusion and stenosis of unspecified carotid artery: Secondary | ICD-10-CM

## 2012-11-20 ENCOUNTER — Ambulatory Visit (INDEPENDENT_AMBULATORY_CARE_PROVIDER_SITE_OTHER): Payer: Medicare Other | Admitting: *Deleted

## 2012-11-20 DIAGNOSIS — I4891 Unspecified atrial fibrillation: Secondary | ICD-10-CM

## 2012-11-20 DIAGNOSIS — Z950 Presence of cardiac pacemaker: Secondary | ICD-10-CM

## 2012-11-22 ENCOUNTER — Encounter (INDEPENDENT_AMBULATORY_CARE_PROVIDER_SITE_OTHER): Payer: Medicare Other

## 2012-11-22 DIAGNOSIS — I700 Atherosclerosis of aorta: Secondary | ICD-10-CM

## 2012-11-22 DIAGNOSIS — I714 Abdominal aortic aneurysm, without rupture: Secondary | ICD-10-CM

## 2012-11-23 ENCOUNTER — Encounter: Payer: Self-pay | Admitting: Internal Medicine

## 2012-11-26 LAB — REMOTE PACEMAKER DEVICE
BRDY-0002RV: 60 {beats}/min
BRDY-0004RV: 130 {beats}/min
RV LEAD IMPEDENCE PM: 508 Ohm
RV LEAD THRESHOLD: 0.75 V

## 2012-12-01 ENCOUNTER — Other Ambulatory Visit: Payer: Self-pay | Admitting: Cardiology

## 2012-12-01 ENCOUNTER — Ambulatory Visit (HOSPITAL_COMMUNITY)
Admission: RE | Admit: 2012-12-01 | Discharge: 2012-12-01 | Disposition: A | Discharge status: Home / Self Care | Payer: Medicare Other | Source: Ambulatory Visit | Attending: Cardiology | Admitting: Cardiology

## 2012-12-01 ENCOUNTER — Encounter: Payer: Self-pay | Admitting: Cardiology

## 2012-12-01 ENCOUNTER — Ambulatory Visit (INDEPENDENT_AMBULATORY_CARE_PROVIDER_SITE_OTHER): Payer: Medicare Other | Admitting: Cardiology

## 2012-12-01 VITALS — BP 167/84 | HR 65 | Ht 71.0 in | Wt 180.0 lb

## 2012-12-01 DIAGNOSIS — I4891 Unspecified atrial fibrillation: Secondary | ICD-10-CM

## 2012-12-01 DIAGNOSIS — D509 Iron deficiency anemia, unspecified: Secondary | ICD-10-CM

## 2012-12-01 DIAGNOSIS — I619 Nontraumatic intracerebral hemorrhage, unspecified: Secondary | ICD-10-CM

## 2012-12-01 DIAGNOSIS — I509 Heart failure, unspecified: Secondary | ICD-10-CM | POA: Insufficient documentation

## 2012-12-01 DIAGNOSIS — I714 Abdominal aortic aneurysm, without rupture, unspecified: Secondary | ICD-10-CM

## 2012-12-01 DIAGNOSIS — R0602 Shortness of breath: Secondary | ICD-10-CM

## 2012-12-01 DIAGNOSIS — I10 Essential (primary) hypertension: Secondary | ICD-10-CM

## 2012-12-01 DIAGNOSIS — J90 Pleural effusion, not elsewhere classified: Secondary | ICD-10-CM | POA: Insufficient documentation

## 2012-12-01 DIAGNOSIS — I5032 Chronic diastolic (congestive) heart failure: Secondary | ICD-10-CM

## 2012-12-01 DIAGNOSIS — G4733 Obstructive sleep apnea (adult) (pediatric): Secondary | ICD-10-CM

## 2012-12-01 DIAGNOSIS — I25810 Atherosclerosis of coronary artery bypass graft(s) without angina pectoris: Secondary | ICD-10-CM

## 2012-12-01 DIAGNOSIS — I679 Cerebrovascular disease, unspecified: Secondary | ICD-10-CM

## 2012-12-01 DIAGNOSIS — I452 Bifascicular block: Secondary | ICD-10-CM

## 2012-12-01 DIAGNOSIS — I441 Atrioventricular block, second degree: Secondary | ICD-10-CM

## 2012-12-01 DIAGNOSIS — Z950 Presence of cardiac pacemaker: Secondary | ICD-10-CM

## 2012-12-01 DIAGNOSIS — E785 Hyperlipidemia, unspecified: Secondary | ICD-10-CM

## 2012-12-01 LAB — COMPREHENSIVE METABOLIC PANEL
CO2: 28 mEq/L (ref 19–32)
Creat: 0.99 mg/dL (ref 0.50–1.35)
Glucose, Bld: 102 mg/dL — ABNORMAL HIGH (ref 70–99)
Total Bilirubin: 0.6 mg/dL (ref 0.3–1.2)
Total Protein: 7.7 g/dL (ref 6.0–8.3)

## 2012-12-01 LAB — CBC
Hemoglobin: 11.3 g/dL — ABNORMAL LOW (ref 13.0–17.0)
MCH: 28.8 pg (ref 26.0–34.0)
MCV: 89.8 fL (ref 78.0–100.0)
RBC: 3.92 MIL/uL — ABNORMAL LOW (ref 4.22–5.81)

## 2012-12-01 MED ORDER — ENALAPRIL MALEATE 10 MG PO TABS: 10.0000 mg | ORAL_TABLET | Freq: Every day | ORAL | Status: DC

## 2012-12-01 NOTE — Assessment & Plan Note (Signed)
Elevated today. We'll increase enalapril to 10 mg by mouth every morning.

## 2012-12-01 NOTE — Assessment & Plan Note (Signed)
Carotids are stable.

## 2012-12-01 NOTE — Assessment & Plan Note (Signed)
This may be from worsening anemia or worsening pleural effusion. Appropriate studies ordered.

## 2012-12-01 NOTE — Progress Notes (Signed)
HPI Mr Eric [name] comes in today because his wife is concerned that he is weaker and short of breath just walking across the room.  Since his fall and intracranial bleed, he is really lost his passion for his work. As most of his day at home. He still goes to cardiac rehabilitation and does stretching exercises. 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  He has to sleep upright in his CPAP has been giving him some difficulty. He does not fall asleep very easily. It is not; orthopnea. His wife is unable to weigh him anymore. His swelling has been minimal in his legs however.  There no other complaints. He is compliant with his medications.  Past Medical History  Diagnosis Date  . BPH (benign prostatic hypertrophy)   . DJD (degenerative joint disease)   . Hx of transient ischemic attack (TIA)   . AAA (abdominal aortic aneurysm)   . Cerebrovascular disease   . HLD (hyperlipidemia)   . HTN (hypertension)   . Second degree AV block, Mobitz type II     s/p PPM  . CAD (coronary artery disease)   . Persistent atrial fibrillation     on pradaxa  . Sleep apnea     cpap machine  . Gastric ulcer, acute 02/2011    multiple - NSAID/Pradaxa thought causative. associated gastroparesis-like problems  . Small bowel obstruction     multiple over the years  . Anemia   . Anemia     Current Outpatient Prescriptions  Medication Sig Dispense Refill  . aspirin EC 325 MG EC tablet Take 1 tablet (325 mg total) by mouth daily.  30 tablet    . carbidopa-levodopa (SINEMET IR) 25-100 MG per tablet Take 1 tablet by mouth 2 (two) times daily with a meal. 1/2 in morning 1/2 at night      . diclofenac sodium (VOLTAREN) 1 % GEL Apply 2 g topically 4 (four) times daily. To hip  4 Tube  1  . finasteride (PROSCAR) 5 MG tablet Take 5 mg by mouth daily.      . furosemide (LASIX) 20 MG tablet Take 1 tablet (20 mg total) by mouth daily.  30 tablet  11  . Multiple Vitamin (MULTIVITAMINS PO) Take 1 tablet by mouth daily.         . pantoprazole (PROTONIX) 40 MG tablet Take 40 mg by mouth daily.      . potassium chloride SA (K-DUR,KLOR-CON) 20 MEQ tablet Take 1 tablet (20 mEq total) by mouth daily.  30 tablet  11  . rivastigmine (EXELON) 4.6 mg/24hr Place 1 patch onto the skin daily.      . simvastatin (ZOCOR) 20 MG tablet Take 1 tablet (20 mg total) by mouth every evening.  30 tablet  5  . tamsulosin (FLOMAX) 0.4 MG CAPS Take 0.4 mg by mouth at bedtime.      . enalapril (VASOTEC) 10 MG tablet Take 1 tablet (10 mg total) by mouth daily.  30 tablet  6  . venlafaxine XR (EFFEXOR-XR) 37.5 MG 24 hr capsule Take 1 capsule (37.5 mg total) by mouth daily. Increase to 75mg  after 30 days  90 capsule  3   No current facility-administered medications for this visit.    Allergies  Allergen Reactions  . Morphine Other (See Comments)    Reaction unknown  . Penicillins Other (See Comments)    Reaction unknown  . Promethazine Hcl Other (See Comments)    Change in behavior    Family History  Problem Relation Age of Onset  . Coronary artery disease Father   . Colon cancer Brother     History   Social History  . Marital Status: Married    Spouse Name: N/A    Number of Children: N/A  . Years of Education: N/A   Occupational History  . retired    Social History Main Topics  . Smoking status: Former Smoker -- 1.00 packs/day for 5 years    Types: Cigarettes    Quit date: 06/07/1968  . Smokeless tobacco: Never Used  . Alcohol Use: No  . Drug Use: No  . Sexually Active: Not on file   Other Topics Concern  . Not on file   Social History Narrative  . No narrative on file    ROS ALL NEGATIVE EXCEPT THOSE NOTED IN HPI  PE  General Appearance: well developed, well nourished in no acute distress HEENT: symmetrical face, PERRLA, good dentition  Neck: no JVD, thyromegaly, or adenopathy, trachea midline Chest: symmetric without deformity Cardiac: PMI non-displaced, irregular rate and rhythm, normal S1, S2, no  gallop or murmur Lung: Dullness to percussion a third the way up on the right. Decreased breath sounds as well. Vascular: all pulses full without bruits  Abdominal: nondistended, nontender, good bowel sounds, no HSM, no bruits Extremities: no cyanosis, clubbing or edema, no sign of DVT, no varicosities  Skin: normal color, no rashes Neuro: alert and oriented x 3, non-focal Pysch: Passive but pleasant  EKG Chronic A. fib, ventricular pacing BMET    Component Value Date/Time   NA 137 03/30/2012 0918   K 4.4 03/30/2012 0918   CL 101 03/30/2012 0918   CO2 27 03/30/2012 0918   GLUCOSE 191* 03/30/2012 0918   BUN 23 03/30/2012 0918   CREATININE 0.84 03/30/2012 0918   CALCIUM 9.3 03/30/2012 0918   GFRNONAA 78* 03/30/2012 0918   GFRAA >90 03/30/2012 0918    Lipid Panel     Component Value Date/Time   CHOL 98 03/22/2011 0345   TRIG 52 03/24/2011 0625   HDL 56 02/11/2009 0000   CHOLHDL 2.2 Ratio 02/11/2009 0000   VLDL 8 02/11/2009 0000   LDLCALC 59 02/11/2009 0000    CBC    Component Value Date/Time   WBC 6.1 08/14/2012 1106   WBC 8.1 03/21/2012 1759   RBC 4.20 08/14/2012 1106   RBC 4.05* 03/21/2012 1759   HGB 11.9* 08/14/2012 1106   HCT 37.2* 08/14/2012 1106   PLT 195 08/14/2012 1106   MCV 89 08/14/2012 1106   MCH 28.3 08/14/2012 1106   MCH 25.2* 03/21/2012 1759   MCHC 32.0 08/14/2012 1106   MCHC 29.6* 03/21/2012 1759   RDW 15.2 08/14/2012 1106   RDW 19.8* 03/21/2012 1759   LYMPHSABS 1.3 08/14/2012 1106   LYMPHSABS 1.3 03/15/2012 0530   MONOABS 1.1* 03/15/2012 0530   EOSABS 0.2 08/14/2012 1106   EOSABS 0.2 03/15/2012 0530   BASOSABS 0.0 08/14/2012 1106   BASOSABS 0.0 03/15/2012 0530

## 2012-12-01 NOTE — Assessment & Plan Note (Signed)
Slight increase on last and recent ultrasound. We'll continues conservative management.

## 2012-12-01 NOTE — Assessment & Plan Note (Signed)
Asymptomatic and paced.Marland Kitchen He is not a candidate for anticoagulation since his fall and intracranial hemorrhage. Continue aspirin 325 mg a day.

## 2012-12-01 NOTE — Assessment & Plan Note (Addendum)
We'll check his CBC and ferritin.

## 2012-12-01 NOTE — Assessment & Plan Note (Signed)
His last chest x-ray was in February. This effusion may be larger by exam. We'll check chest x-ray to see if he needs a thoracentesis.

## 2012-12-01 NOTE — Patient Instructions (Addendum)
Your physician recommends that you schedule a follow-up appointment in: TO BE DETERMINED  A chest x-ray takes a picture of the organs and structures inside the chest, including the heart, lungs, and blood vessels. This test can show several things, including, whether the heart is enlarges; whether fluid is building up in the lungs; and whether pacemaker / defibrillator leads are still in place.  Your physician recommends that you return for lab work in: TODAY (SLIPS GIVEN FOR CBC, CMP)  Your physician has recommended you make the following change in your medication:   1) INCREASE YOUR VASOTEC (ENALAPRIL) TO 10MG  ONCE DAILY

## 2012-12-01 NOTE — Assessment & Plan Note (Signed)
This looks relatively stable. His effusion may be a significant part of his shortness of breath. He also may be getting more anemic.

## 2012-12-04 ENCOUNTER — Encounter: Payer: Self-pay | Admitting: *Deleted

## 2012-12-07 ENCOUNTER — Encounter: Payer: Self-pay | Admitting: Internal Medicine

## 2012-12-18 ENCOUNTER — Encounter: Payer: Self-pay | Admitting: Internal Medicine

## 2013-01-15 ENCOUNTER — Other Ambulatory Visit: Payer: Self-pay | Admitting: Cardiology

## 2013-02-01 ENCOUNTER — Encounter: Payer: Medicare Other | Admitting: Cardiology

## 2013-02-06 ENCOUNTER — Encounter: Payer: Medicare Other | Admitting: Cardiology

## 2013-02-12 NOTE — Progress Notes (Signed)
ELECTROPHYSIOLOGY OFFICE NOTE  Patient ID: TAURUS ALAMO MRN: [id number], DOB/AGE: [birth year]   Date of Visit: 02/13/2013  Primary Physician: Valera Castle, MD Primary Cardiologist / EP: Wall, MD / Allred, MD Reason for Visit: EP/device follow-up  History of Present Illness Mr. Beske is an 79 year old gentleman with Mobitz II AV block s/p PPM, permanent AFib, CAD, HTN, prior TIA, OSA and dyslipidemia who presents today for routine EP/device follow-up. He is accompanied by his wife.   He denies any complaints. He denies CP, SOB, palpitations, dizziness, near syncope or syncope. He denies LE or abdominal swelling, orthopnea or PND. He reports compliance with his medications. Of note, Mrs. Sofranko reports that he experienced a fall back in early October 2013 with subsequent right frontal ICH. Xarelto was discontinued. He was evaluated by Dr. Daleen Squibb June 2014 and deemed not a candidate for anticoagulation.  Past Medical History Past Medical History  Diagnosis Date  . BPH (benign prostatic hypertrophy)   . DJD (degenerative joint disease)   . Hx of transient ischemic attack (TIA)   . AAA (abdominal aortic aneurysm)   . Cerebrovascular disease   . HLD (hyperlipidemia)   . HTN (hypertension)   . Second degree AV block, Mobitz type II     s/p PPM  . CAD (coronary artery disease)   . Persistent atrial fibrillation     on pradaxa  . Sleep apnea     cpap machine  . Gastric ulcer, acute 02/2011    multiple - NSAID/Pradaxa thought causative. associated gastroparesis-like problems  . Small bowel obstruction     multiple over the years  . Anemia   . Anemia     Past Surgical History Past Surgical History  Procedure Laterality Date  . Pacemaker insertion  01/24/10    Adapta [employer]   . Coronary artery bypass graft    . Appendectomy    . Gastrectomy    . Knee arthroscopy      right, with medial and lateral meniscal  . Finger surgery       Left middle finger dorsal lesion  . Upper  gastrointestinal endoscopy  03/12/2011    stomach ulcer, esophageal erosion  . Esophagogastroduodenoscopy  01/13/2012    Procedure: ESOPHAGOGASTRODUODENOSCOPY (EGD);  Surgeon: Iva Boop, MD;  Location: [facility];  Service: Endoscopy;  Laterality: N/A;  . Flexible sigmoidoscopy  01/13/2012    Procedure: FLEXIBLE SIGMOIDOSCOPY;  Surgeon: Iva Boop, MD;  Location: [facility];  Service: Endoscopy;  Laterality: N/A;  Fleets enema on arrival    Allergies/Intolerances Allergies  Allergen Reactions  . Morphine Other (See Comments)    Reaction unknown  . Penicillins Other (See Comments)    Reaction unknown  . Promethazine Hcl Other (See Comments)    Change in behavior    Current Home Medications Current Outpatient Prescriptions  Medication Sig Dispense Refill  . aspirin EC 325 MG EC tablet Take 1 tablet (325 mg total) by mouth daily.  30 tablet    . carbidopa-levodopa (SINEMET IR) 25-100 MG per tablet Take 1 tablet by mouth 2 (two) times daily with a meal. 1/2 in morning 1/2 at night      . diclofenac sodium (VOLTAREN) 1 % GEL Apply 2 g topically 4 (four) times daily. To hip  4 Tube  1  . enalapril (VASOTEC) 10 MG tablet Take 1 tablet (10 mg total) by mouth daily.  30 tablet  6  . finasteride (PROSCAR) 5 MG tablet Take 5 mg by  mouth daily.      . furosemide (LASIX) 20 MG tablet Take 1 tablet (20 mg total) by mouth daily.  30 tablet  11  . Multiple Vitamin (MULTIVITAMINS PO) Take 1 tablet by mouth daily.        . pantoprazole (PROTONIX) 40 MG tablet Take 40 mg by mouth daily.      . potassium chloride SA (K-DUR,KLOR-CON) 20 MEQ tablet Take 1 tablet (20 mEq total) by mouth daily.  30 tablet  11  . simvastatin (ZOCOR) 20 MG tablet TAKE ONE TABLET EVERY EVENING  90 tablet  2   No current facility-administered medications for this visit.    Social History Social History  . Marital Status: Married   Occupational History  . retired    Social History Main Topics  . Smoking status:  Former Smoker -- 1.00 packs/day for 5 years    Types: Cigarettes    Quit date: 06/07/1968  . Smokeless tobacco: Never Used  . Alcohol Use: No  . Drug Use: No   Review of Systems General: No chills, fever, night sweats or weight changes Cardiovascular: No chest pain, dyspnea on exertion, edema, orthopnea, palpitations, paroxysmal nocturnal dyspnea Dermatological: No rash, lesions or masses Respiratory: No cough, dyspnea Urologic: No hematuria, dysuria Abdominal: No nausea, vomiting, diarrhea, bright red blood per rectum, melena, or hematemesis Neurologic: No visual changes, weakness, changes in mental status All other systems reviewed and are otherwise negative except as noted above.  Physical Exam Vitals: Blood pressure 144/90, pulse 65, height 5\' 11"  (1.803 m), weight 177 lb (80.287 kg), SpO2 97.00%.  General: Well developed, well appearing 79 y.o. male in no acute distress., well appearing 79 y.o. male in no acute distress. HEENT: Normocephalic, atraumatic. EOMs intact. Sclera nonicteric. Oropharynx clear.  Neck: Supple. No JVD. Lungs: Respirations regular and unlabored, CTA bilaterally. No wheezes, rales or rhonchi. Heart: Regular. S1, S2 present. No murmurs, rub, S3 or S4. Abdomen: Soft, non-distended. Extremities: No clubbing, cyanosis or edema. PT/Radials 2+ and equal bilaterally. Psych: Normal affect. Neuro: Alert and oriented X 3. Moves all extremities spontaneously.   Diagnostics Device interrogation today - Normal device function. Threshold, sensing, impedance consistent with previous measurements. Device programmed to maximize longevity. No high ventricular rates noted. Device programmed at appropriate safety margins. Histogram distribution appropriate for patient activity level. Device programmed to optimize intrinsic conduction. Estimated longevity 11 years.   Assessment and Plan 1. Mobitz II AV block s/p PPM implant - normal device function - no programming changes made - continue routine remote PPM follow-up every 3  months - return to clinic for follow-up with Dr. Johney Frame in one year 2. Permanent AFib - stable; rate controlled - not a candidate for anticoagulation since fall and ICH 3. CAD - stable without anginal symptoms - continue medical therapy  Signed, Rick Duff, PA-C 02/13/2013, 12:41 PM

## 2013-02-13 ENCOUNTER — Encounter: Payer: Self-pay | Admitting: Cardiology

## 2013-02-13 ENCOUNTER — Ambulatory Visit (INDEPENDENT_AMBULATORY_CARE_PROVIDER_SITE_OTHER): Payer: Medicare Other | Admitting: Cardiology

## 2013-02-13 VITALS — BP 144/90 | HR 65 | Ht 71.0 in | Wt 177.0 lb

## 2013-02-13 DIAGNOSIS — I441 Atrioventricular block, second degree: Secondary | ICD-10-CM

## 2013-02-13 DIAGNOSIS — Z950 Presence of cardiac pacemaker: Secondary | ICD-10-CM

## 2013-02-13 DIAGNOSIS — I2510 Atherosclerotic heart disease of native coronary artery without angina pectoris: Secondary | ICD-10-CM

## 2013-02-13 DIAGNOSIS — I4891 Unspecified atrial fibrillation: Secondary | ICD-10-CM

## 2013-02-13 LAB — PACEMAKER DEVICE OBSERVATION
BATTERY VOLTAGE: 2.78 V
BMOD-0005RV: 95 {beats}/min
RV LEAD AMPLITUDE: 15.68 mv
RV LEAD IMPEDENCE PM: 515 Ohm
RV LEAD THRESHOLD: 0.75 V
VENTRICULAR PACING PM: 92.2

## 2013-02-13 NOTE — Patient Instructions (Signed)
Your physician recommends that you schedule a follow-up appointment in: 3 MONTHS REMOTE PACER CHECK  Your physician wants you to follow-up in: 1 YEAR WITH DR.ALLRED You will receive a reminder letter in the mail two months in advance. If you don't receive a letter, please call our office to schedule the follow-up appointment.

## 2013-02-19 ENCOUNTER — Encounter: Payer: Self-pay | Admitting: Internal Medicine

## 2013-02-20 IMAGING — CR DG UGI W/O KUB
1 series · 1 of 1 positions shown · non-contrast
Comparison: CT abdomen/pelvis dated 02/18/2011.

CLINICAL DATA: Evaluate for small bowel obstruction

UPPER GI SERIES WITHOUT KUB
TECHNIQUE: Routine upper GI series was performed with thin barium.
Fluoroscopy Time: 1.17 minutes

[view not recorded]
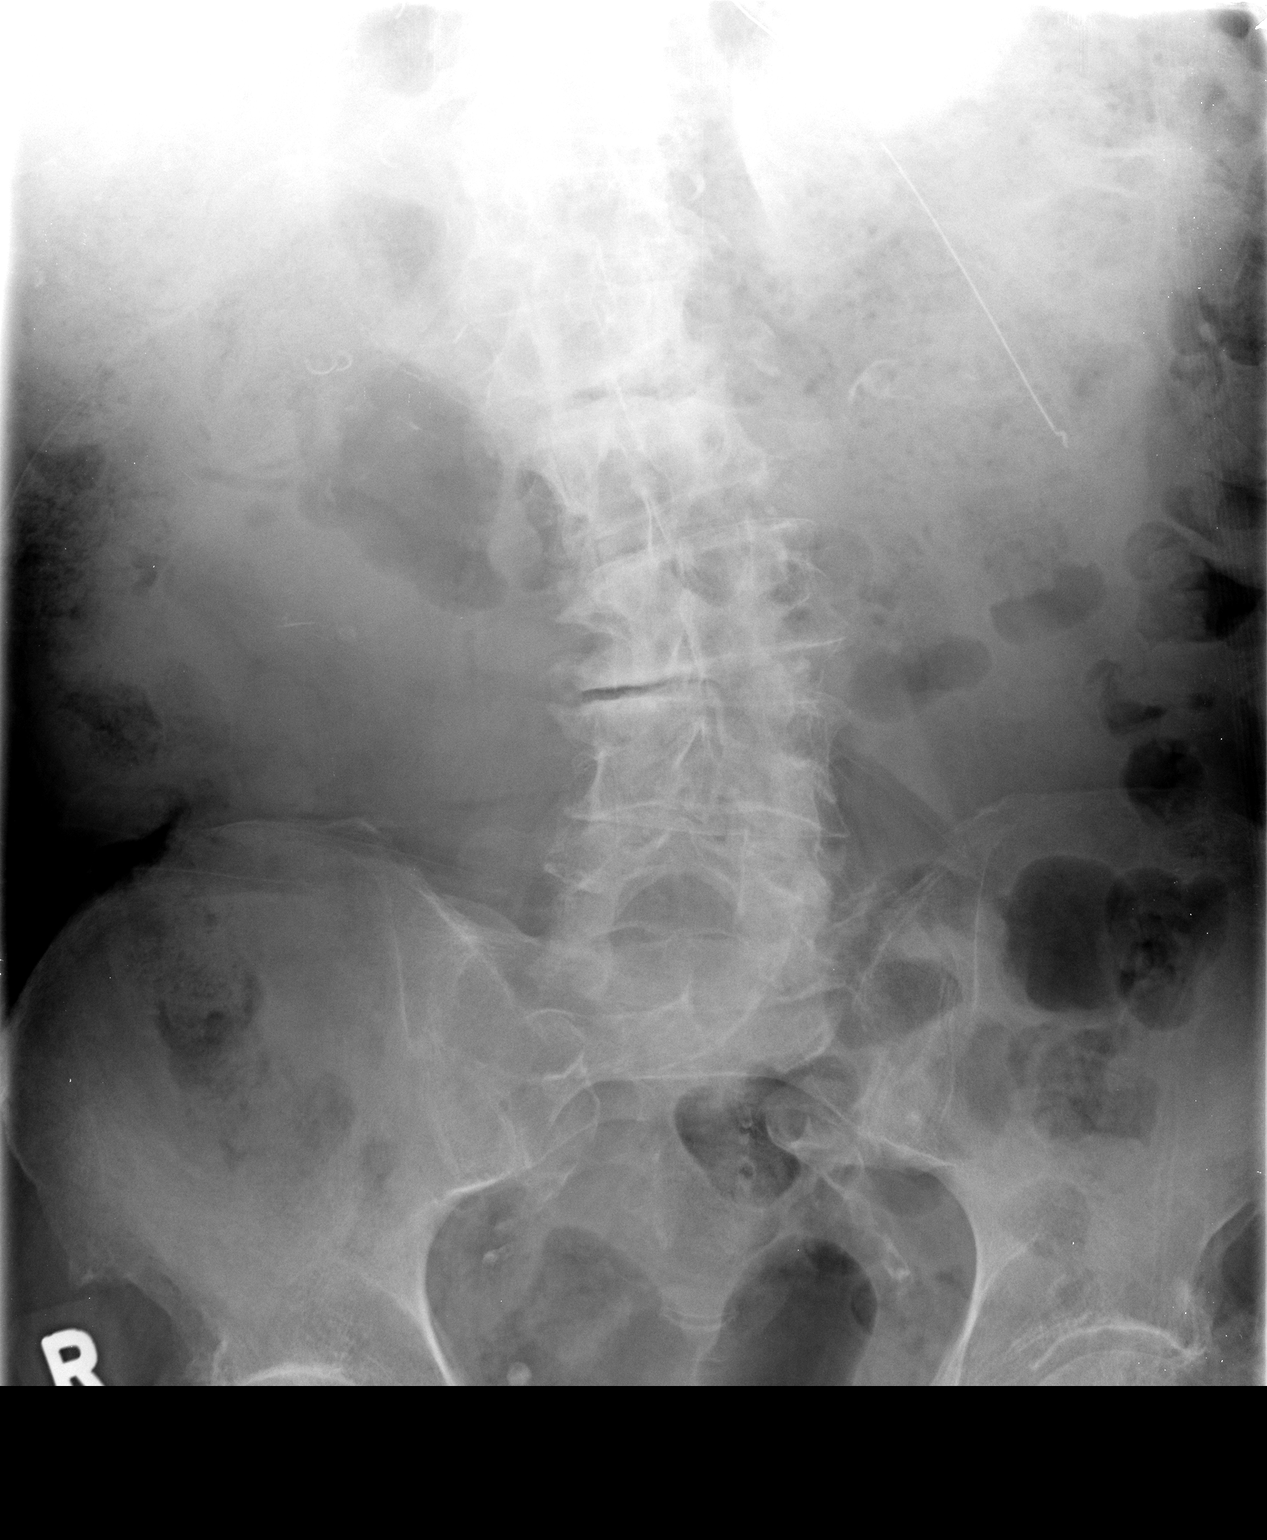

[1 of 1 positions shown; findings below may reference images not displayed]

FINDINGS: Initial spot radiographs demonstrate a large amount of
debris within a dilated stomach.

60 ml thin barium was instilled via indwelling nasogastric tube.
This opacified a very small portion of the gastric cardia.

The patient was placed in the right lateral decubitus position for
approximately 20 minutes in an effort to disperse contrast within
the stomach.

At the conclusion of the study, dilute contrast was present in the
stomach, along with a large amount of debris.  The proximal small
bowel was not opacified.
IMPRESSION: Large amount of debris within a dilated stomach.  When correlating
with the prior CT, this appearance is suspicious for severe
gastroparesis or gastric outlet obstruction.

A small bowel follow-through could not be performed.

Findings discussed with and acknowledged by Dr. Felux Arnaldo Cherena on
02/20/2011 at 9299 hrs.

## 2013-02-21 IMAGING — CR DG CHEST 1V PORT
1 series · 1 of 1 positions shown · non-contrast
Comparison: 03/11/2010.

CLINICAL DATA: Right-sided PICC placement.

PORTABLE CHEST - 1 VIEW

[view not recorded]
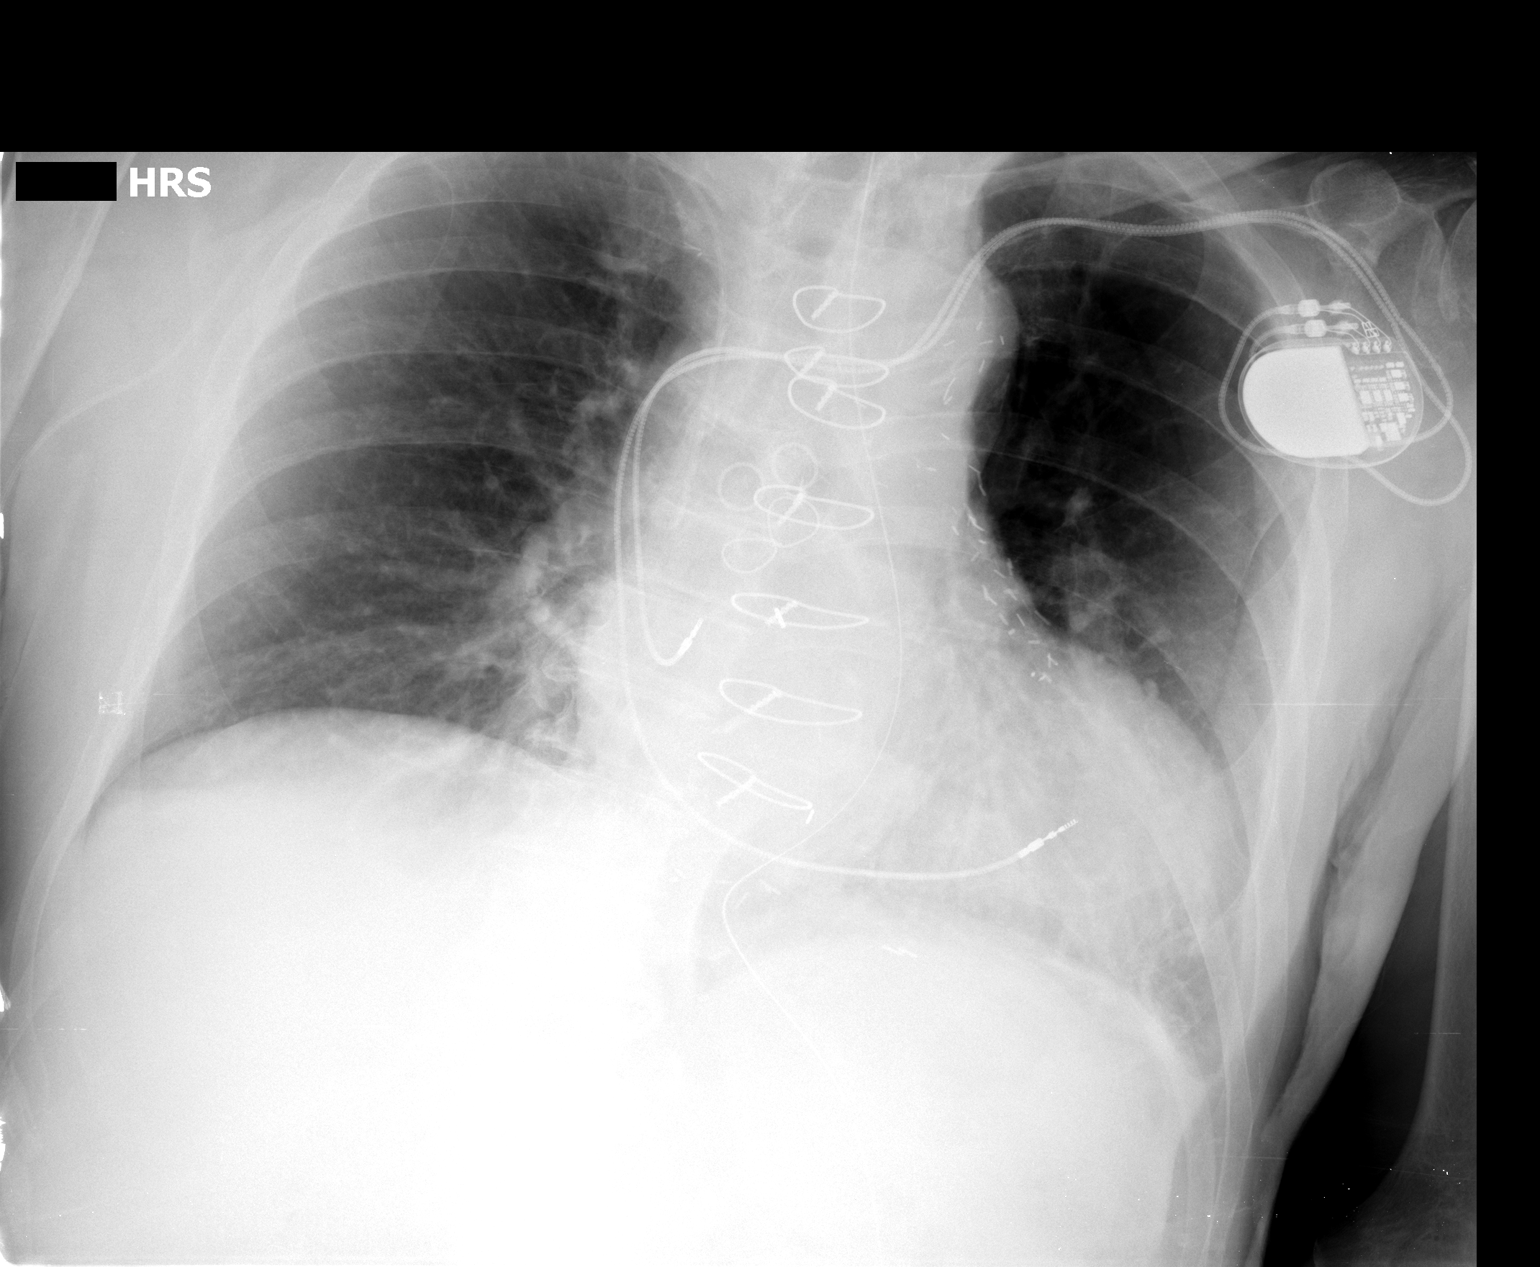

[1 of 1 positions shown; findings below may reference images not displayed]

FINDINGS: There is a right upper extremity PICC with the tip in the
mid SVC.  CABG/median sternotomy.  Cardiomegaly.  Nasogastric tube
is present with the proximal side port in the stomach.  Tip is not
visualized.  No airspace disease.  Patient is rotated to the left.
IMPRESSION: New right upper extremity PICC with the tip in the mid SVC.
Cardiomegaly without failure.

## 2013-02-23 IMAGING — CR DG ABD PORTABLE 1V
2 series · 2 of 2 positions shown · non-contrast
Comparison: 02/21/2011;

CLINICAL DATA: Abdominal 10 years, history of gastric outlet
obstruction

ABDOMEN - 1 VIEW

[AP (1 of 2)]
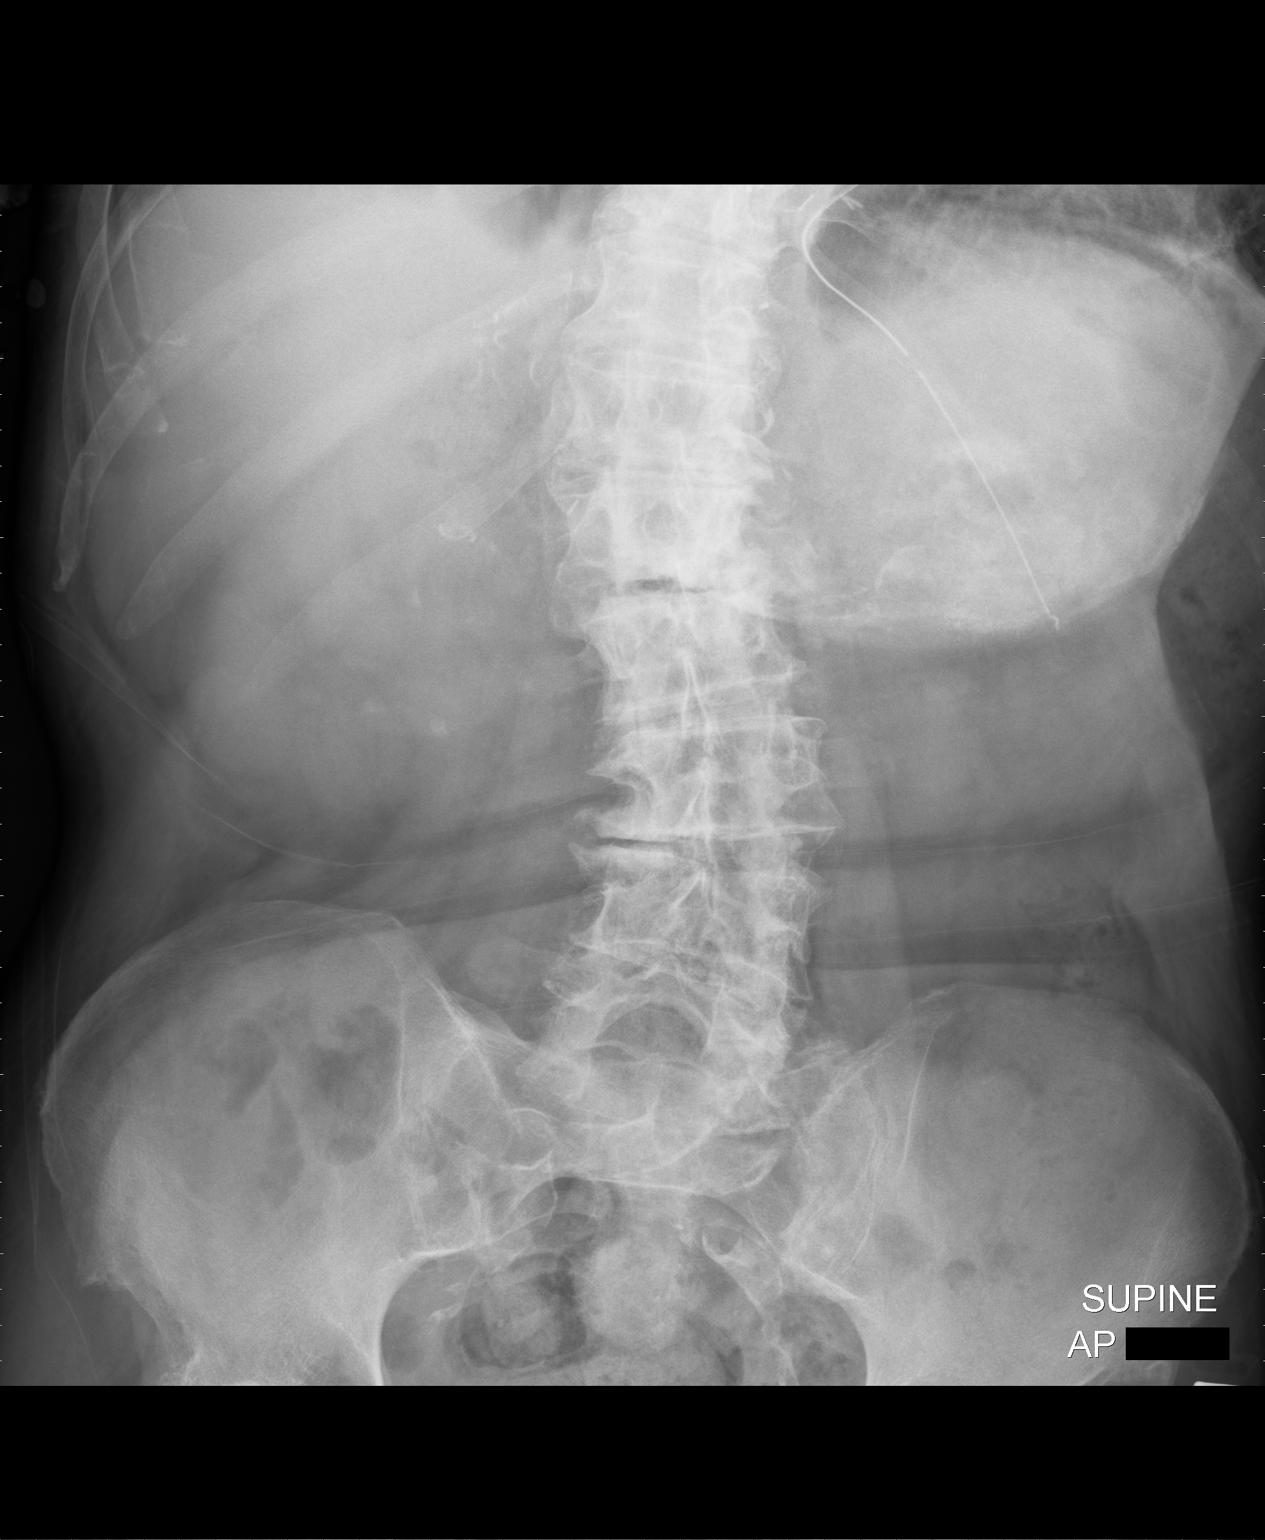

[AP (2 of 2)]
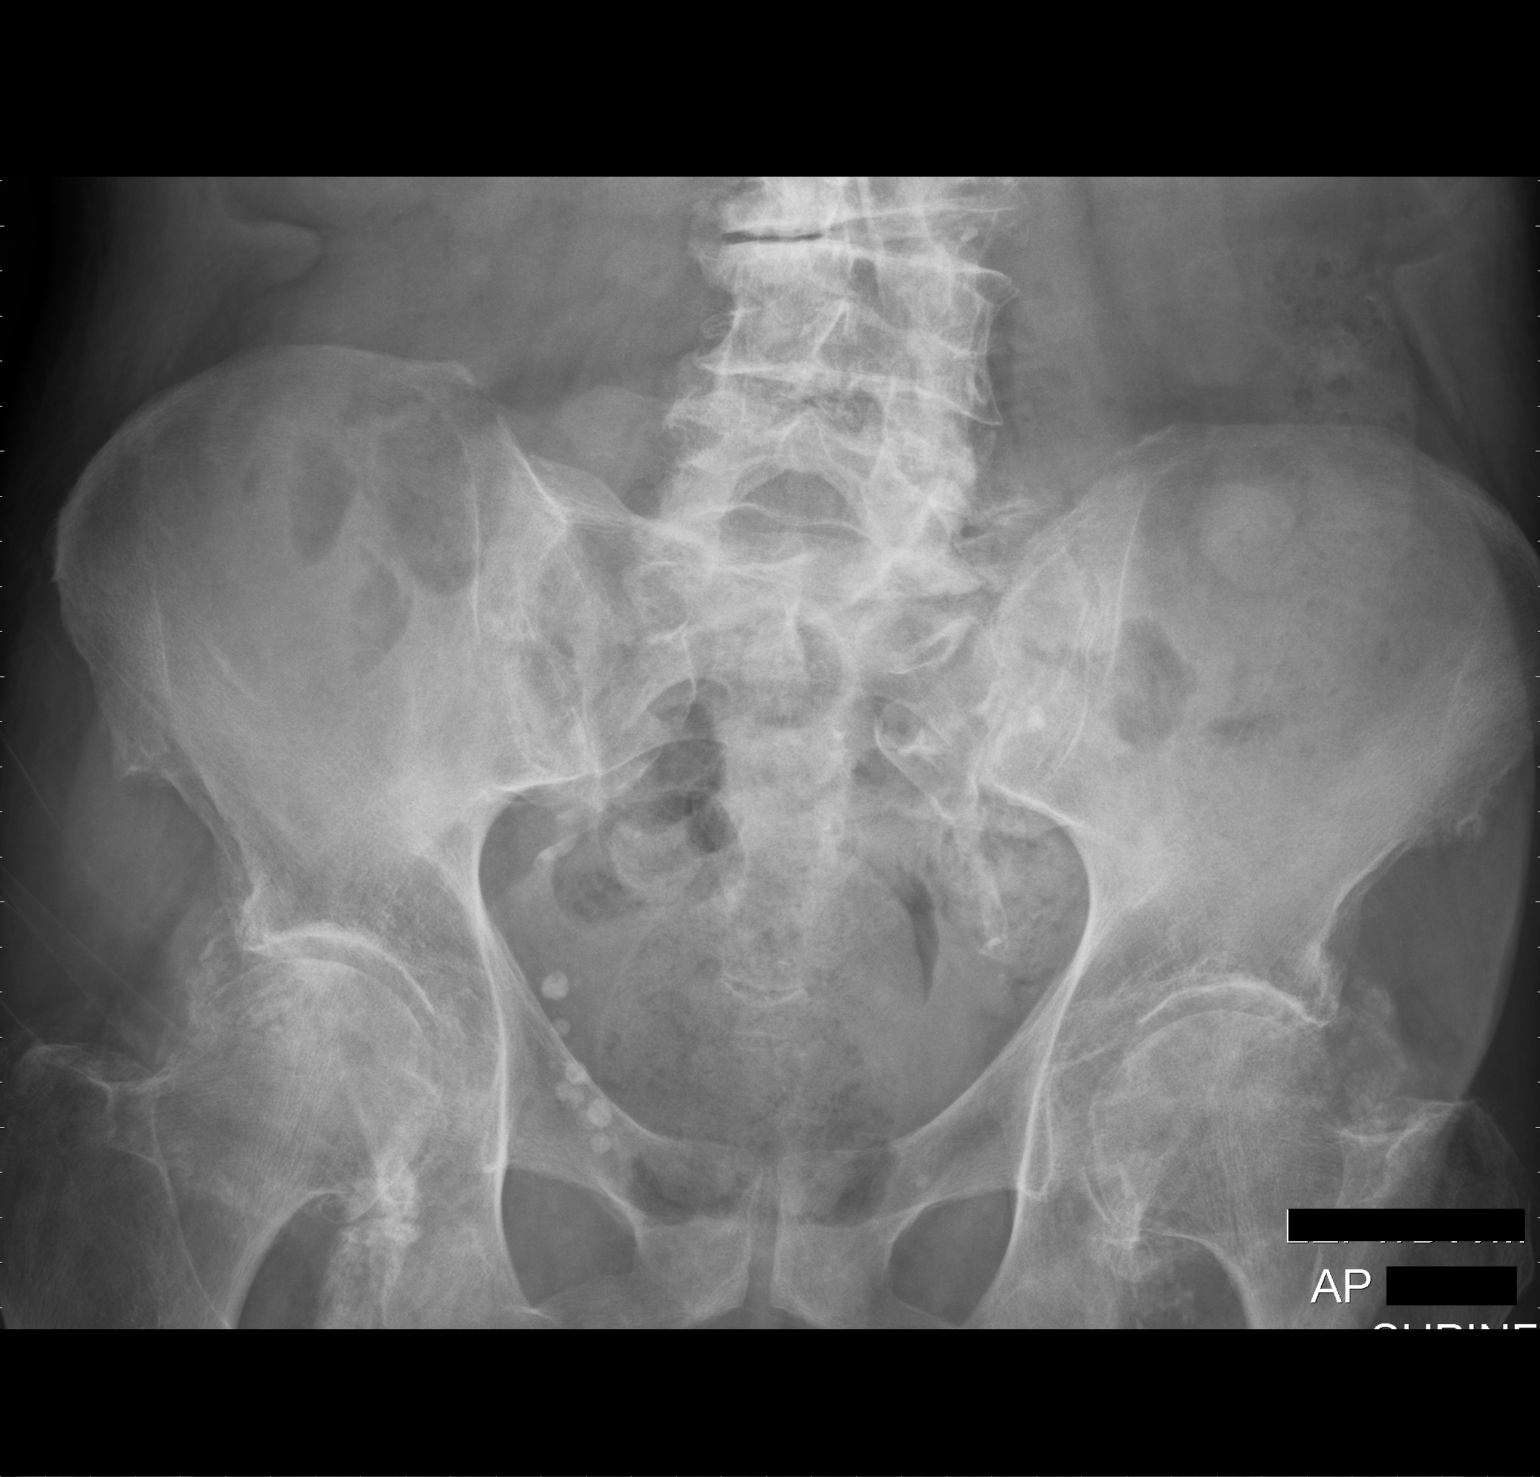

[2 of 2 positions shown; findings below may reference images not displayed]

AP chest radiograph - 02/21/2011; upper GI
series - 02/20/2011; CT of the abdomen and pelvis - 02/18/2011
FINDINGS: Residual barium seen within the gastric fundus.  Paucity
of bowel gas precludes evaluation for obstruction.  Evaluate for
pneumoperitoneum is limited secondary to supine patient
positioning, however there is peculiar crescent shaped lucency
underneath the left hemidiaphragm.  Limited visualization of the
lower thorax, demonstrates bibasilar heterogeneous opacities.
Possible small bilateral effusions.  Multilevel lumbar spine
degenerative change.  Extensive vascular calcifications.
IMPRESSION: 1.     Peculiar lucency below the left hemidiaphragm may represent
      subsegmental atelectasis though pneumoperitoneum have a similar
      appearance.  Further evaluation may be performed with a left
      lateral abdominal radiograph were abdominal CT as clinically
      indicated.

2.    Small amount of residual barium within the gastric fundus.
Paucity of bowel gas without definite evidence of obstruction.
Above discussed with Dr. Ofentse Nchix at the [DATE] a.m.

## 2013-03-03 IMAGING — CR DG ABD PORTABLE 2V
1 series · 2 of 2 positions shown · non-contrast
Comparison: One-view abdomen x-ray 02/26/2011 [HOSPITAL].
Two-view abdomen x-ray 02/23/2011 and 02/21/2011 [REDACTED].

CLINICAL DATA: Recent GI bleeding.  Nausea and vomiting.

PORTABLE ABDOMEN - 2 VIEW 03/03/2011:

[Series 1: ap (kub) · U · 2 of 2 slices shown]
[im 1/2]
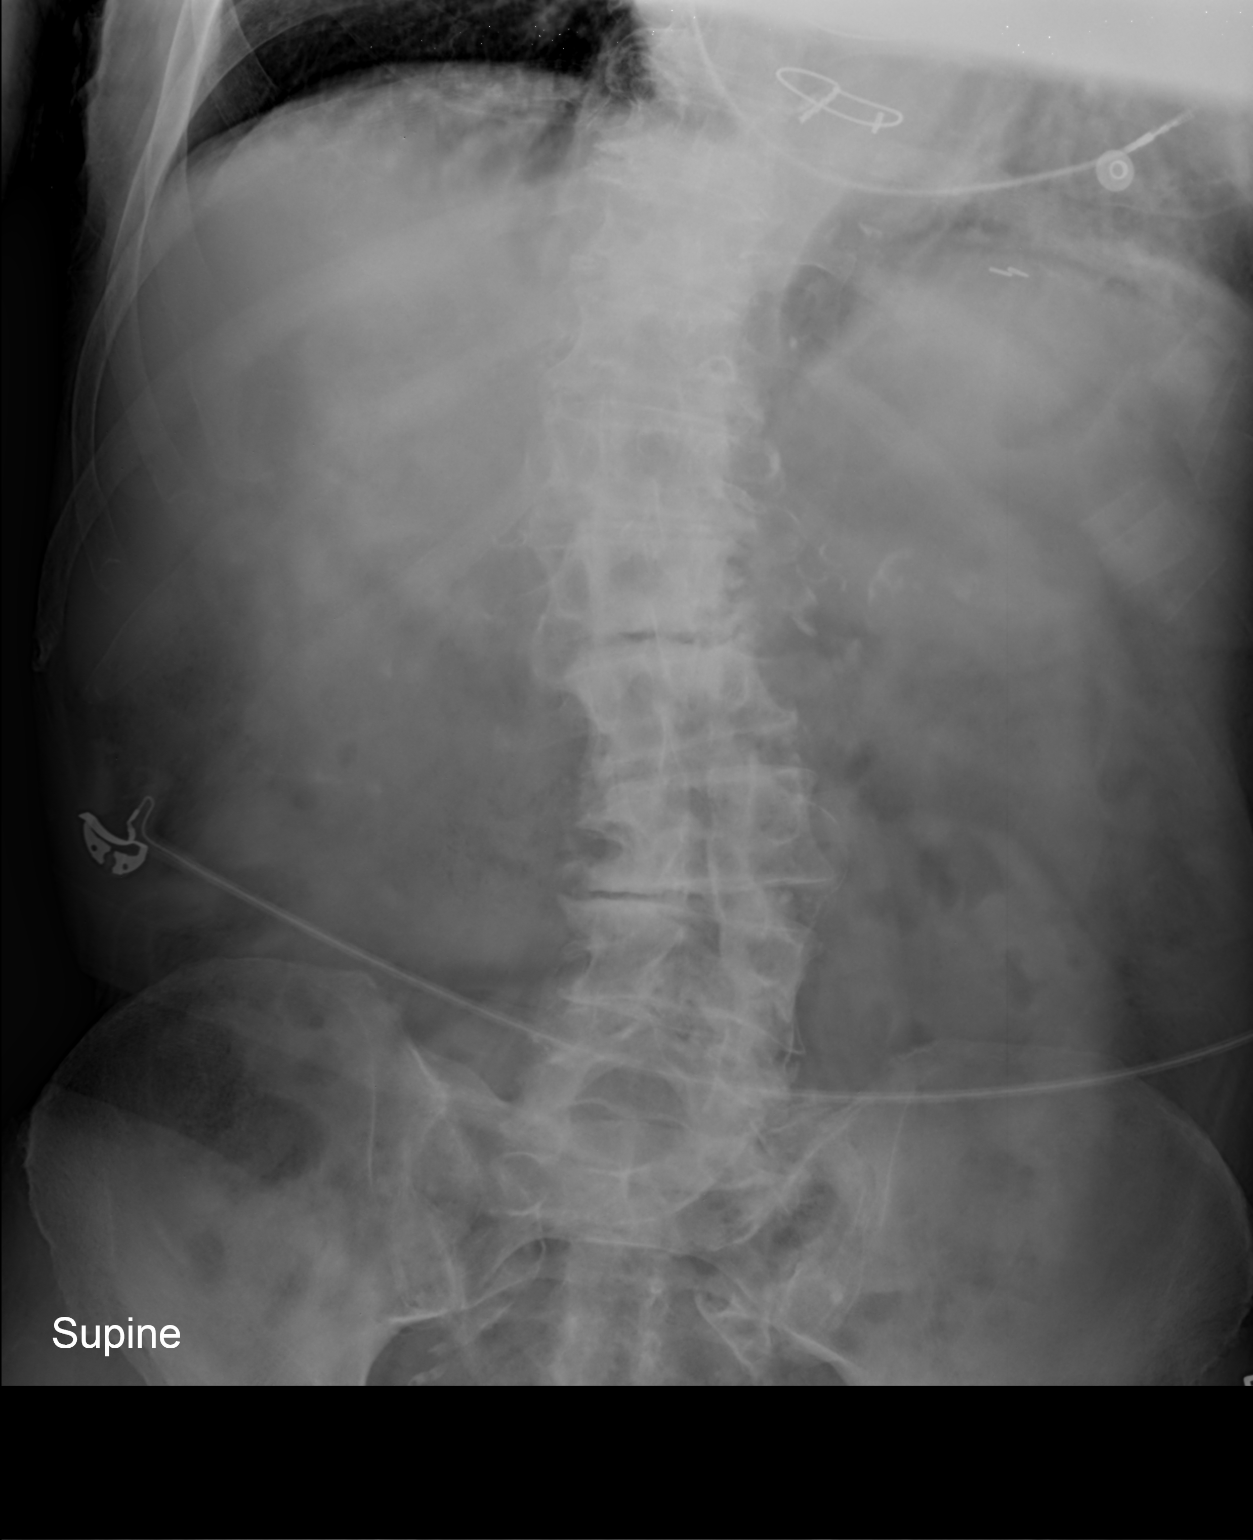
[im 2/2]
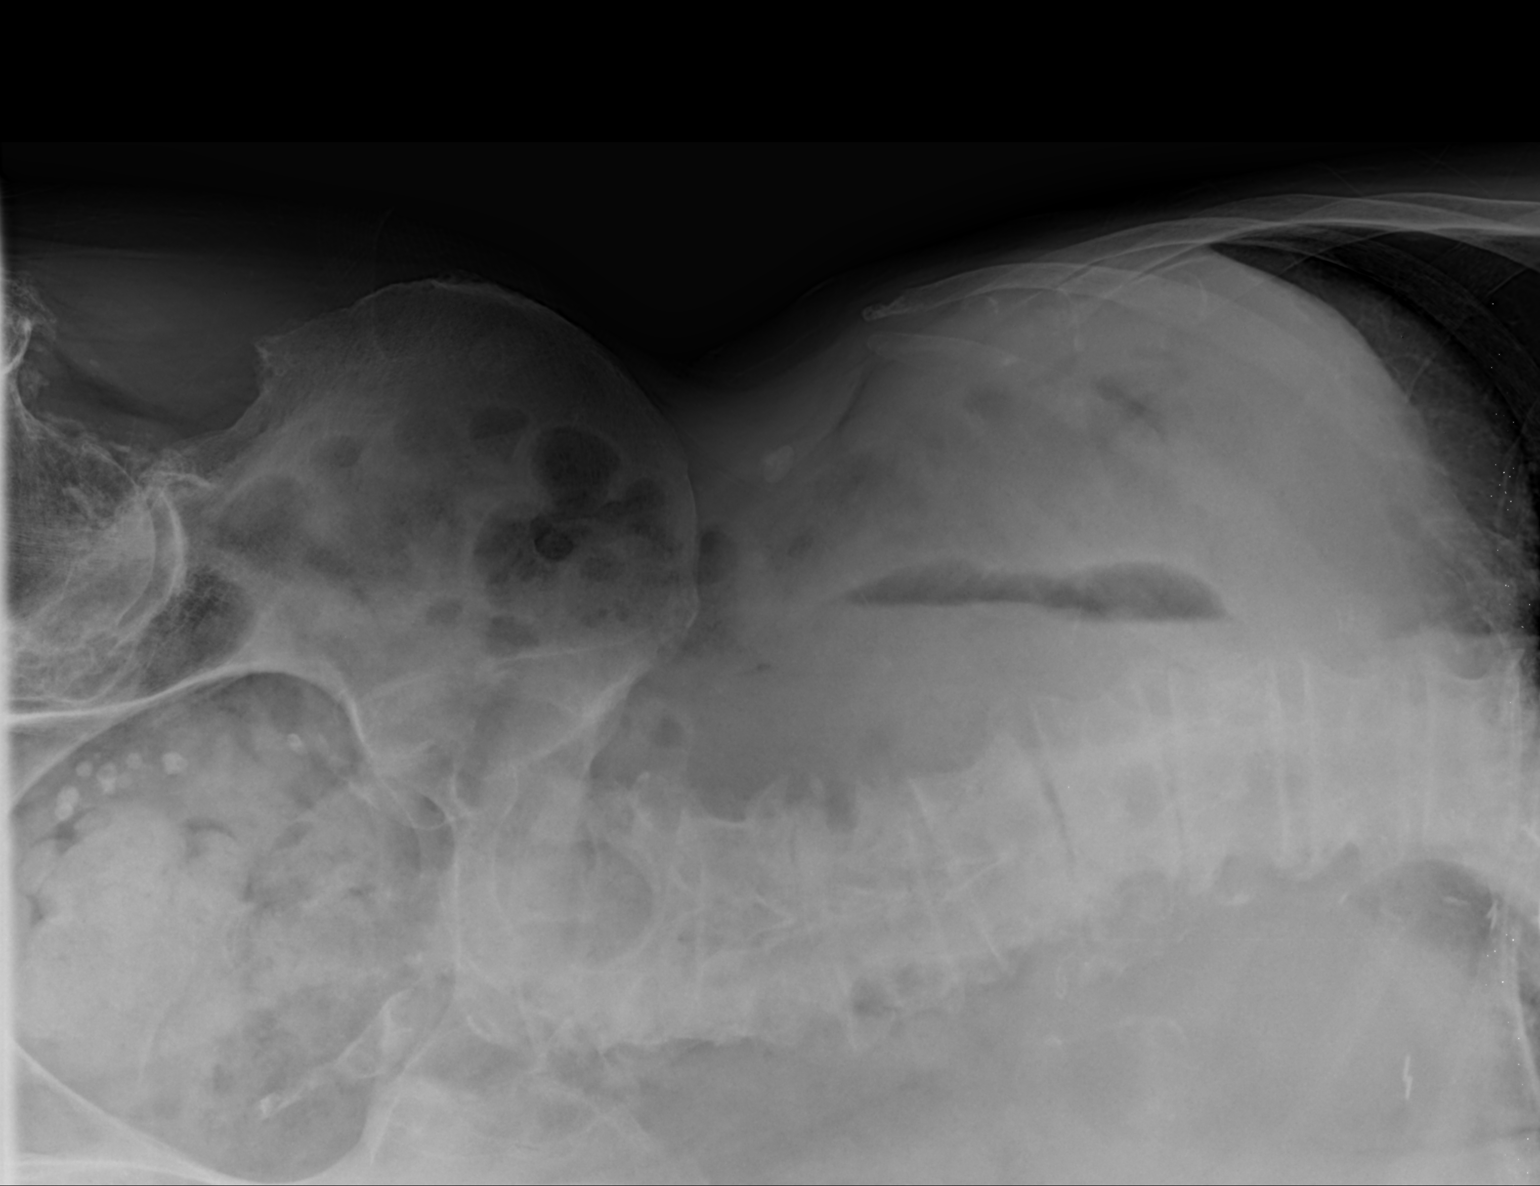

[2 of 2 positions shown; findings below may reference images not displayed]

FINDINGS: Bowel gas pattern unremarkable without evidence of
obstruction or significant ileus.  No evidence of free
intraperitoneal air on the lateral decubitus image.  Aorto-iliac
and visceral artery atherosclerosis with an approximate 3.8 cm
focal saccular aneurysm in the distal aorta.  Phleboliths in the
pelvis.  No visible opaque urinary tract calculi.  Severe
degenerative changes involving the lower thoracic and lumbar spine
and both sacroiliac joints.
IMPRESSION: No acute abdominal abnormality.  Infrarenal abdominal aortic
saccular aneurysm.

## 2013-05-09 ENCOUNTER — Encounter: Payer: Self-pay | Admitting: Cardiovascular Disease

## 2013-05-09 ENCOUNTER — Ambulatory Visit (INDEPENDENT_AMBULATORY_CARE_PROVIDER_SITE_OTHER): Payer: Medicare Other | Admitting: Cardiovascular Disease

## 2013-05-09 VITALS — BP 136/98 | HR 71 | Ht 71.0 in | Wt 176.8 lb

## 2013-05-09 DIAGNOSIS — I441 Atrioventricular block, second degree: Secondary | ICD-10-CM

## 2013-05-09 DIAGNOSIS — I25810 Atherosclerosis of coronary artery bypass graft(s) without angina pectoris: Secondary | ICD-10-CM

## 2013-05-09 DIAGNOSIS — I5032 Chronic diastolic (congestive) heart failure: Secondary | ICD-10-CM

## 2013-05-09 DIAGNOSIS — I4891 Unspecified atrial fibrillation: Secondary | ICD-10-CM

## 2013-05-09 DIAGNOSIS — I10 Essential (primary) hypertension: Secondary | ICD-10-CM

## 2013-05-09 LAB — BASIC METABOLIC PANEL
BUN: 21 mg/dL (ref 4–21)
Creatinine: 1.1 mg/dL (ref 0.6–1.3)
POTASSIUM: 4.4 mmol/L (ref 3.4–5.3)

## 2013-05-09 LAB — CBC AND DIFFERENTIAL
HEMOGLOBIN: 11.3 g/dL — AB (ref 13.5–17.5)
WBC: 7 10*3/mL

## 2013-05-09 NOTE — Assessment & Plan Note (Signed)
Blood pressure is well controlled on today's visit. No changes made to the medications. 

## 2013-05-09 NOTE — Assessment & Plan Note (Signed)
He takes Lasix daily. Reasonable fluid intake. Mild edema pitting of the right lower STEMI, not much on the left, likely from venous insufficiency

## 2013-05-09 NOTE — Assessment & Plan Note (Signed)
Currently with no symptoms of angina. No further workup at this time. Continue current medication regimen. 

## 2013-05-09 NOTE — Progress Notes (Signed)
Patient ID: Eric [name], male    DOB: 03-20-1926, 79 y.o.   MRN: [id number]  HPI Comments: Mr Usery is a pleasant 79 year old gentleman with history of coronary disease, CABG,  abdominal aortic aneurysm, second degree AV block type II with pacemaker, chronic atrial fibrillation not on anticoagulation secondary to high fall risk  history of falls, walks with a walker, relatively sedentary secondary to weak legs who presents to establish care in the [place] office.  In presentation today, he is not very verbal, wife does most of the talking. He has mild leg edema worse on the right than the left which has been chronic. No recent falls as he has a Higher education careers adviser and walks with him. They tried to get him up to walk every day. He sleeps long hours, also naps in the daytime. Some skin breakdown over one of his hips. They have been treating this aggressively. He does not eat very much the morning, has a good appetite later in the daytime. Blood pressure typically is well controlled, not low, not very high. Does not complain about any new symptoms on today's visit and overall reports he is doing well.   Prior hospitalization in October 2014 for a fall. Intracranial bleed at that time. EKG today shows atrial fibrillation with rate 71 beats per minute, paced rhythm   Past Medical History Diagnosis Date . BPH (benign prostatic hypertrophy)  . DJD (degenerative joint disease)  . Hx of transient ischemic attack (TIA)  . AAA (abdominal aortic aneurysm)  . Cerebrovascular disease  . HLD (hyperlipidemia)  . HTN (hypertension)  . Second degree AV block, Mobitz type II    s/p PPM . CAD (coronary artery disease)  . Persistent atrial fibrillation  . Sleep apnea    cpap machine . Gastric ulcer, acute 02/2011   multiple - NSAID/Pradaxa thought causative. associated gastroparesis-like problems . Small bowel obstruction    multiple over the years . Anemia  . Anemia     Outpatient Encounter  Prescriptions as of 05/09/2013  Medication Sig  . aspirin EC 325 MG EC tablet Take 1 tablet (325 mg total) by mouth daily.  . carbidopa-levodopa (SINEMET IR) 25-100 MG per tablet 1/2 in morning 1/2 at night  . Coenzyme Q10 (CO Q-10) 100 MG CAPS Take 100 mg by mouth daily.  . diclofenac sodium (VOLTAREN) 1 % GEL Apply 2 g topically 4 (four) times daily. To hip  . enalapril (VASOTEC) 10 MG tablet Take 1 tablet (10 mg total) by mouth daily.  . finasteride (PROSCAR) 5 MG tablet Take 5 mg by mouth daily.  . furosemide (LASIX) 20 MG tablet Take 1 tablet (20 mg total) by mouth daily.  . Multiple Vitamin (MULTIVITAMINS PO) Take 1 tablet by mouth daily.    Marland Kitchen NAMENDA 10 MG tablet Take 10 mg by mouth daily.   . pantoprazole (PROTONIX) 40 MG tablet Take 40 mg by mouth daily.  . potassium chloride SA (K-DUR,KLOR-CON) 20 MEQ tablet Take 1 tablet (20 mEq total) by mouth daily.  . simvastatin (ZOCOR) 20 MG tablet TAKE ONE TABLET EVERY EVENING  . VITAMIN D, CHOLECALCIFEROL, PO Take 5,000 Units by mouth daily.     Review of Systems  Constitutional: Negative.   HENT: Negative.   Eyes: Negative.   Respiratory: Negative.   Cardiovascular: Negative.   Gastrointestinal: Negative.   Endocrine: Negative.   Musculoskeletal: Positive for gait problem.  Skin: Negative.   Allergic/Immunologic: Negative.   Neurological: Negative.   Hematological: Negative.  Psychiatric/Behavioral: Negative.   All other systems reviewed and are negative.    BP 136/98  Pulse 71  Ht 5\' 11"  (1.803 m)  Wt 176 lb 12 oz (80.173 kg)  BMI 24.66 kg/m2  Physical Exam  Nursing note and vitals reviewed. Constitutional: He is oriented to person, place, and time. He appears well-developed and well-nourished.  HENT:  Head: Normocephalic.  Nose: Nose normal.  Mouth/Throat: Oropharynx is clear and moist.  Eyes: Conjunctivae are normal. Pupils are equal, round, and reactive to light.  Neck: Normal range of motion. Neck supple. No  JVD present.  Cardiovascular: Normal rate, regular rhythm, S1 normal, S2 normal, normal heart sounds and intact distal pulses.  Exam reveals no gallop and no friction rub.   No murmur heard. Pulmonary/Chest: Effort normal and breath sounds normal. No respiratory distress. He has no wheezes. He has no rales. He exhibits no tenderness.  Abdominal: Soft. Bowel sounds are normal. He exhibits no distension. There is no tenderness.  Musculoskeletal: Normal range of motion. He exhibits no edema and no tenderness.  Lymphadenopathy:    He has no cervical adenopathy.  Neurological: He is alert and oriented to person, place, and time. Coordination normal.  Skin: Skin is warm and dry. No rash noted. No erythema.  Psychiatric: He has a normal mood and affect. His behavior is normal. Judgment and thought content normal.      Assessment and Plan

## 2013-05-09 NOTE — Assessment & Plan Note (Signed)
He presents on aspirin alone. Not a candidate per the notes for anticoagulation. High fall risk. Wife is aware of risk and benefit.

## 2013-05-09 NOTE — Assessment & Plan Note (Signed)
Pacer in place, followed by Dr. Johney Frame

## 2013-05-09 NOTE — Patient Instructions (Signed)
You are doing well. No medication changes were made.  We will do blood work today Iron panel, CBC, bmp  Please call us if you have new issues that need to be addressed before your next appt.  Your physician wants you to follow-up in: 6 months.  You will receive a reminder letter in the mail two months in advance. If you don't receive a letter, please call our office to schedule the follow-up appointment.

## 2013-05-10 ENCOUNTER — Other Ambulatory Visit: Payer: Self-pay

## 2013-05-10 ENCOUNTER — Other Ambulatory Visit: Payer: Self-pay | Admitting: Cardiology

## 2013-05-10 DIAGNOSIS — I4891 Unspecified atrial fibrillation: Secondary | ICD-10-CM

## 2013-05-10 DIAGNOSIS — I25810 Atherosclerosis of coronary artery bypass graft(s) without angina pectoris: Secondary | ICD-10-CM

## 2013-05-11 ENCOUNTER — Telehealth: Payer: Self-pay

## 2013-05-11 NOTE — Telephone Encounter (Signed)
Does Dr Mariah Milling fill this? Please advise. Thanks, MI

## 2013-05-11 NOTE — Telephone Encounter (Signed)
Message copied by Marilynne Halsted on Fri May 11, 2013 11:01 AM ------      Message from: Antonieta Iba      Created: Thu May 10, 2013 10:51 PM       Labs look good,      Not too anemia, low normal      Iron a little on the low side, not bad ------

## 2013-05-11 NOTE — Telephone Encounter (Signed)
Spoke w/ [name].  She is aware of results.

## 2013-05-21 ENCOUNTER — Ambulatory Visit (INDEPENDENT_AMBULATORY_CARE_PROVIDER_SITE_OTHER): Payer: Medicare Other | Admitting: *Deleted

## 2013-05-21 DIAGNOSIS — I441 Atrioventricular block, second degree: Secondary | ICD-10-CM

## 2013-05-21 DIAGNOSIS — Z950 Presence of cardiac pacemaker: Secondary | ICD-10-CM

## 2013-05-21 LAB — MDC_IDC_ENUM_SESS_TYPE_REMOTE
Battery Remaining Longevity: 129 mo
Date Time Interrogation Session: 20141215154545
Lead Channel Impedance Value: 507 Ohm
Lead Channel Pacing Threshold Amplitude: 0.75 V
Lead Channel Setting Pacing Pulse Width: 0.4 ms

## 2013-05-25 ENCOUNTER — Other Ambulatory Visit: Payer: Self-pay | Admitting: Cardiology

## 2013-05-29 ENCOUNTER — Telehealth: Payer: Self-pay

## 2013-05-29 NOTE — Telephone Encounter (Signed)
Spoke w/ pt's wife.  Reports that pt has a sore on his buttock that is painful and oozing. Wife has been applying duoderm strip, but not helping.

## 2013-05-29 NOTE — Telephone Encounter (Signed)
Pt wife called and states he has a "place on his behind that is bleeding". Pt wife states that Dr. Mariah Milling "was going to make an appointment with Dr. Darrick Huntsman, but she hasn't heard anything. I gave pt wife Dr. Darrick Huntsman #, but she also wanted me to let Dr. Mariah Milling know of this problem with pt. She requests a nurse call her.

## 2013-05-29 NOTE — Telephone Encounter (Signed)
Spoke w/ Dr. Melina Schools office about possibly getting appt for pt to be seen in their office today. They are to speak w/ provider and call us back.

## 2013-05-30 NOTE — Telephone Encounter (Signed)
Spoke w/ Dr. Daleen Squibb who reports that pt will be seen in the wound clinic.

## 2013-06-01 ENCOUNTER — Emergency Department: Payer: Self-pay | Admitting: Emergency Medicine

## 2013-06-04 ENCOUNTER — Ambulatory Visit: Payer: Medicare Other | Admitting: Internal Medicine

## 2013-06-06 ENCOUNTER — Ambulatory Visit (INDEPENDENT_AMBULATORY_CARE_PROVIDER_SITE_OTHER): Payer: Medicare Other | Admitting: Internal Medicine

## 2013-06-06 ENCOUNTER — Encounter: Payer: Self-pay | Admitting: Surgery

## 2013-06-06 VITALS — BP 140/80 | HR 67 | Temp 97.0°F | Resp 12

## 2013-06-06 DIAGNOSIS — M4712 Other spondylosis with myelopathy, cervical region: Secondary | ICD-10-CM

## 2013-06-06 DIAGNOSIS — L8993 Pressure ulcer of unspecified site, stage 3: Secondary | ICD-10-CM

## 2013-06-06 DIAGNOSIS — L89109 Pressure ulcer of unspecified part of back, unspecified stage: Secondary | ICD-10-CM

## 2013-06-06 DIAGNOSIS — D509 Iron deficiency anemia, unspecified: Secondary | ICD-10-CM

## 2013-06-06 DIAGNOSIS — K56609 Unspecified intestinal obstruction, unspecified as to partial versus complete obstruction: Secondary | ICD-10-CM

## 2013-06-06 DIAGNOSIS — L89153 Pressure ulcer of sacral region, stage 3: Secondary | ICD-10-CM

## 2013-06-06 DIAGNOSIS — I619 Nontraumatic intracerebral hemorrhage, unspecified: Secondary | ICD-10-CM

## 2013-06-06 NOTE — Progress Notes (Signed)
Pre visit review using our clinic review tool, if applicable. No additional management support is needed unless otherwise documented below in the visit note. 

## 2013-06-06 NOTE — Patient Instructions (Signed)
You can use up to 2000 mg  Daily of tylenol and up to 300 mg of tramadol . Add 25 mg benadryl (dipenhydramine)  at bedtime if it keeps him awake.  Try adding some chopped meat to his mac n cheese for the protein

## 2013-06-06 NOTE — Progress Notes (Signed)
Patient ID: Eric [name], male   DOB: 08-23-1925, 79 y.o.   MRN: [id number]   Patient Active Problem List   Diagnosis Date Noted  . Sacral decubitus ulcer, stage III 06/07/2013  . Shortness of breath 12/01/2012  . ICH (intracerebral hemorrhage) 03/08/2012  . Hematoma of frontal scalp 03/08/2012  . Pleural effusion on right 03/08/2012  . Gastritis 01/13/2012  . Internal hemorrhoids 01/13/2012  . Iron deficiency anemia, unspecified 01/05/2012  . OSA (obstructive sleep apnea) 09/29/2011  . Chronic diastolic heart failure 07/23/2011  . Recurrent small bowel obstructions 04/07/2011  . Pulmonary nodule 03/10/2011  . Gastric stasis with food retention 02/17/2011  . Atrial fibrillation 03/19/2010  . MOBITZ II ATRIOVENTRICULAR BLOCK 02/03/2010  . PACEMAKER, PERMANENT 02/03/2010  . RBBB W/ LAFB 07/30/2009  . CAD, AUTOLOGOUS BYPASS GRAFT 03/06/2009  . OBSTRUCTIVE SLEEP APNEA 01/22/2008  . HYPERLIPIDEMIA 01/19/2008  . HYPERTENSION 01/19/2008  . CEREBROVASCULAR DISEASE 01/19/2008  . ABDOMINAL AORTIC ANEURYSM 01/19/2008  . DEGENERATIVE JOINT DISEASE 01/19/2008  . TRANSIENT ISCHEMIC ATTACKS, HX OF 01/19/2008  . BENIGN PROSTATIC HYPERTROPHY, HX OF 01/19/2008    Subjective:  CC:   Chief Complaint  Patient presents with  . Establish Care    no complaints at this time.     HPI:   Eric [name] is a 79 y.o. male who presents as a new patient to establish primary care with the chief complaint of    New onset Sacral decubitus ulcer  involving the natal cleft and right buttock.  Accompanied by his wife [name], who has provided all of the history.  The wound is currently dressed and was not examined since it was debrided and cultured by the the physician at the Wound center earlier this [facility]).   He is not ambulatory since his  ICH . She has noted that he has had a major personality change since his ICH.  He used to be very passionate about his hobbies and former business  (hosiery) but he no longer has avid interest in anything, he is generally happy and does not have mood swings, insomnia or depression.  History of bowel obstruction.  He has Occasional loose stools but using stool softener to prevent obstipation and obstruction    Spine problems. Records not available through [facility], Managed by the Spine Clinic at [facility] but his current neurosurgeon is leaving so [name] is currently looking for recommendations for an alternative physician.  She has been giving him prescribed tramadol for the past 2 weeks for pain control which has been tolerated thus far,  NSAIDs avoided due to history of gastritis and heart failure   Past Medical History  Diagnosis Date  . BPH (benign prostatic hypertrophy)   . DJD (degenerative joint disease)   . Hx of transient ischemic attack (TIA)   . AAA (abdominal aortic aneurysm)   . Cerebrovascular disease   . HLD (hyperlipidemia)   . HTN (hypertension)   . Second degree AV block, Mobitz type II     s/p PPM  . CAD (coronary artery disease)   . Persistent atrial fibrillation     on pradaxa  . Sleep apnea     cpap machine  . Gastric ulcer, acute 02/2011    multiple - NSAID/Pradaxa thought causative. associated gastroparesis-like problems  . Small bowel obstruction     multiple over the years  . Anemia   . Anemia     Past Surgical History  Procedure Laterality Date  . Pacemaker insertion  01/24/10    Adapta Medtronic   . Coronary artery bypass graft    . Appendectomy    . Gastrectomy    . Knee arthroscopy      right, with medial and lateral meniscal  . Finger surgery       Left middle finger dorsal lesion  . Upper gastrointestinal endoscopy  03/12/2011    stomach ulcer, esophageal erosion  . Esophagogastroduodenoscopy  01/13/2012    Procedure: ESOPHAGOGASTRODUODENOSCOPY (EGD);  Surgeon: Iva Boop, MD;  Location: [facility];  Service: Endoscopy;  Laterality: N/A;  . Flexible sigmoidoscopy  01/13/2012     Procedure: FLEXIBLE SIGMOIDOSCOPY;  Surgeon: Iva Boop, MD;  Location: [facility];  Service: Endoscopy;  Laterality: N/A;  Fleets enema on arrival    Family History  Problem Relation Age of Onset  . Coronary artery disease Father   . Colon cancer Brother     History   Social History  . Marital Status: Married    Spouse Name: N/A    Number of Children: N/A  . Years of Education: N/A   Occupational History  . retired    Social History Main Topics  . Smoking status: Former Smoker -- 1.00 packs/day for 5 years    Types: Cigarettes    Quit date: 06/07/1968  . Smokeless tobacco: Never Used  . Alcohol Use: No  . Drug Use: No  . Sexual Activity: Not on file   Other Topics Concern  . Not on file   Social History Narrative  . No narrative on file   Allergies  Allergen Reactions  . Morphine Other (See Comments)    Reaction unknown  . Penicillins Other (See Comments)    Reaction unknown  . Promethazine Hcl Other (See Comments)    Change in behavior      Review of Systems:   Patient denies headache, fevers, malaise, unintentional weight loss, skin rash, eye pain, sinus congestion and sinus pain, sore throat, dysphagia,  hemoptysis , cough, dyspnea, wheezing, chest pain, palpitations, orthopnea, edema, abdominal pain, nausea, melena, diarrhea, constipation, flank pain, dysuria, hematuria, urinary  Frequency, nocturia, numbness, tingling, seizures, Loss of consciousness,  Tremor, insomnia, depression, anxiety, and suicidal ideation.      Objective:  BP 140/80  Pulse 67  Temp(Src) 97 F (36.1 C)  Resp 12  SpO2 93%  General appearance: alert, cooperative and appears stated age Ears: normal TM's and external ear canals both ears Throat: lips, mucosa, and tongue normal; teeth and gums normal Neck: no adenopathy, no carotid bruit, supple, symmetrical, trachea midline and thyroid not enlarged, symmetric, no tenderness/mass/nodules Back: symmetric, no curvature. ROM  normal. No CVA tenderness. Lungs: clear to auscultation bilaterally Heart: regular rate and rhythm, S1, S2 normal, no murmur, click, rub or gallop Abdomen: soft, non-tender; bowel sounds normal; no masses,  no organomegaly Pulses: 2+ and symmetric Skin: Skin color, texture, turgor normal. No rashes or lesions Lymph nodes: Cervical, supraclavicular, and axillary nodes normal.  Assessment and Plan:  ICH (intracerebral hemorrhage) Secondary to right forehead trauma resulting in multifocal bleeds and subsequent encephalomalacia.  Patient sees Dr. Hoyle Barr at [facility]  Iron deficiency anemia, unspecified improved per review of outside labs from dearly Dec done at [facility].   Sacral decubitus ulcer, stage III Debrided and cultured by Wound Clinic today.  Offloading and protein supplementation discussed with patient's wife [name] today   Recurrent small bowel obstructions Prevention of constipation has been achieved with daily use of stool softeners   Spondylosis,  cervical, with myelopathy severe degenerative changes noted  On 2014 CT spine. Managed by [facility] with tramadol.    Updated Medication List Outpatient Encounter Prescriptions as of 06/06/2013  Medication Sig  . aspirin EC 325 MG EC tablet Take 1 tablet (325 mg total) by mouth daily.  . carbidopa-levodopa (SINEMET IR) 25-100 MG per tablet 1/2 in morning 1/2 at night  . Coenzyme Q10 (CO Q-10) 100 MG CAPS Take 100 mg by mouth daily.  . diclofenac sodium (VOLTAREN) 1 % GEL Apply 2 g topically 4 (four) times daily. To hip  . enalapril (VASOTEC) 10 MG tablet Take 1 tablet (10 mg total) by mouth daily.  . finasteride (PROSCAR) 5 MG tablet Take 5 mg by mouth daily.  . furosemide (LASIX) 20 MG tablet Take 1 tablet (20 mg total) by mouth daily.  Marland Kitchen KLOR-CON M20 20 MEQ tablet TAKE 1 TABLET EVERY DAY  . Multiple Vitamin (MULTIVITAMINS PO) Take 1 tablet by mouth daily.    Marland Kitchen NAMENDA 10 MG tablet Take 10 mg by mouth daily.   . pantoprazole  (PROTONIX) 40 MG tablet TAKE ONE TABLET EVERY MORNING  . simvastatin (ZOCOR) 20 MG tablet TAKE ONE TABLET EVERY EVENING  . VITAMIN D, CHOLECALCIFEROL, PO Take 5,000 Units by mouth daily.

## 2013-06-07 ENCOUNTER — Ambulatory Visit: Payer: Self-pay | Admitting: Nurse Practitioner

## 2013-06-07 ENCOUNTER — Encounter: Payer: Self-pay | Admitting: Internal Medicine

## 2013-06-07 DIAGNOSIS — M4712 Other spondylosis with myelopathy, cervical region: Secondary | ICD-10-CM | POA: Insufficient documentation

## 2013-06-07 DIAGNOSIS — L89153 Pressure ulcer of sacral region, stage 3: Secondary | ICD-10-CM | POA: Insufficient documentation

## 2013-06-07 NOTE — Assessment & Plan Note (Signed)
Prevention of constipation has been achieved with daily use of stool softeners

## 2013-06-07 NOTE — Assessment & Plan Note (Signed)
severe degenerative changes noted  On 2014 CT spine. [facility].

## 2013-06-07 NOTE — Assessment & Plan Note (Signed)
improved per review of outside labs from dearly Dec done at [facility].

## 2013-06-07 NOTE — Assessment & Plan Note (Signed)
Debrided and cultured by Wound Clinic today.  Offloading and protein supplementation discussed with patient's wife Dewayne Hatchnn today

## 2013-06-07 NOTE — Assessment & Plan Note (Signed)
Secondary to right forehead trauma resulting in multifocal bleeds and subsequent encephalomalacia.  Patient sees [facility]

## 2013-06-11 ENCOUNTER — Telehealth: Payer: Self-pay | Admitting: Internal Medicine

## 2013-06-11 DIAGNOSIS — Z8673 Personal history of transient ischemic attack (TIA), and cerebral infarction without residual deficits: Secondary | ICD-10-CM

## 2013-06-11 DIAGNOSIS — R262 Difficulty in walking, not elsewhere classified: Secondary | ICD-10-CM

## 2013-06-11 NOTE — Telephone Encounter (Signed)
I talked with dr Daleen SquibbWall today about Mr Eric [name]'s increased difficulty  at home.  His Wife Thurston Holeanne is providing care but needs more help.  Would like to recommend [employer]izzuto's agency "[facility]"  To provide private 24/7 assistance at home. Recommended to be my [facility] rep Keri dunckel who I trust.  Would she like me to call her for her?

## 2013-06-12 NOTE — Telephone Encounter (Signed)
Eric [name] will contact the agency ASAP and they will call her.

## 2013-06-12 NOTE — Telephone Encounter (Signed)
Talked with patient wife she stated that which ever you thought would provide the best care and would like referral for help as soon as possible.

## 2013-06-13 ENCOUNTER — Telehealth: Payer: Self-pay | Admitting: *Deleted

## 2013-06-13 ENCOUNTER — Encounter: Payer: Self-pay | Admitting: Surgery

## 2013-06-13 NOTE — Telephone Encounter (Signed)
Patient  Wife called wanting help getting patient into [facility] for rehab for wound care discussed with dr.Tullo and tried to return call to patient. Answer left message to call office.

## 2013-06-13 NOTE — Telephone Encounter (Signed)
Patient notified

## 2013-06-14 ENCOUNTER — Encounter: Payer: Self-pay | Admitting: *Deleted

## 2013-06-14 ENCOUNTER — Ambulatory Visit: Payer: Medicare Other | Admitting: Internal Medicine

## 2013-06-15 ENCOUNTER — Other Ambulatory Visit: Payer: Self-pay | Admitting: Internal Medicine

## 2013-06-15 DIAGNOSIS — L89153 Pressure ulcer of sacral region, stage 3: Secondary | ICD-10-CM

## 2013-06-15 DIAGNOSIS — G839 Paralytic syndrome, unspecified: Secondary | ICD-10-CM

## 2013-06-20 ENCOUNTER — Other Ambulatory Visit: Payer: Self-pay | Admitting: Cardiology

## 2013-06-21 ENCOUNTER — Other Ambulatory Visit: Payer: Self-pay | Admitting: *Deleted

## 2013-06-21 MED ORDER — FUROSEMIDE 20 MG PO TABS: 20.0000 mg | ORAL_TABLET | Freq: Every day | ORAL | Status: DC

## 2013-06-21 NOTE — Telephone Encounter (Signed)
Requested Prescriptions   Signed Prescriptions Disp Refills  . furosemide (LASIX) 20 MG tablet 30 tablet 3    Sig: Take 1 tablet (20 mg total) by mouth daily.    Authorizing Provider: Antonieta IbaGOLLAN, TIMOTHY J    Ordering User: Kendrick FriesLOPEZ, Orlin Kann C

## 2013-06-22 ENCOUNTER — Encounter: Payer: Self-pay | Admitting: Internal Medicine

## 2013-06-22 ENCOUNTER — Telehealth: Payer: Self-pay | Admitting: Internal Medicine

## 2013-06-22 NOTE — Telephone Encounter (Signed)
Called patient DPR and was informed patient is scheduled to be [facility] and would like to get home care in place [facility] before [facility] is discharged please advise.

## 2013-06-22 NOTE — Telephone Encounter (Signed)
[name], the [facility] in [facility].  The agency that Keri recommended to provide a 24 hr caregiver is [facility]. I printed out the contact info.  Can you contact Keri about the discharge date and Randa EvensJoanne at [facility]?  Thank you

## 2013-06-22 NOTE — Telephone Encounter (Signed)
States she was told to let Dr. Darrick Huntsmanullo know 3-4 days before pt was to go [facility].  States she is going to be setting up an agency out [place] (did not know name) for the pt.  Asking for a call.

## 2013-06-22 NOTE — Telephone Encounter (Signed)
*  States Dr. Darrick Huntsmanullo is going to set up the agency for the pt.

## 2013-06-24 ENCOUNTER — Encounter: Payer: Self-pay | Admitting: Internal Medicine

## 2013-06-25 ENCOUNTER — Other Ambulatory Visit: Payer: Self-pay | Admitting: Cardiology

## 2013-06-25 ENCOUNTER — Telehealth: Payer: Self-pay | Admitting: Internal Medicine

## 2013-06-25 DIAGNOSIS — G8310 Monoplegia of lower limb affecting unspecified side: Secondary | ICD-10-CM

## 2013-06-25 DIAGNOSIS — I619 Nontraumatic intracerebral hemorrhage, unspecified: Secondary | ICD-10-CM

## 2013-06-25 DIAGNOSIS — L89109 Pressure ulcer of unspecified part of back, unspecified stage: Secondary | ICD-10-CM

## 2013-06-25 NOTE — Telephone Encounter (Signed)
Spoke with Dewayne HatchAnn.  Patient has been started on mirtazipne for depression/anorexia, has E coli in sacral ulcer and now on ABX.  Palliative Care offered by staff.  Discussed holding off on Palliative care until patient comes hoem to see if being home imporves his mood;.\  Thurston Holenne still waiting on contacdt from [facility] about 24 hr caregiver.  currenlty has a Comptrollersitter at [place] from [employer]. Also needs the lift chair ,   Please contact [facility] to see if the lift chair is in process.   Please give [facility] the contact info for Earlie CountsAnn Weightman

## 2013-06-25 NOTE — Telephone Encounter (Signed)
[facility] and [name] notified of patient discharge awaiting return call from [facility].

## 2013-06-25 NOTE — Telephone Encounter (Signed)
I need order for electrical hospital bed. Standing lift, and alternating air pressure mattress. All supplies in at [facility] just need order to fax to [phone] with DX code please .

## 2013-06-25 NOTE — Telephone Encounter (Signed)
Patient is not eating and family concerned .

## 2013-06-25 NOTE — Telephone Encounter (Signed)
Eric [name] came into office.  States Mr. [name] is [facility].  States Mr. [name] is not eating very much.  Asking for [facility], or what other options do they have?

## 2013-06-25 NOTE — Telephone Encounter (Signed)
Talked with Eric [name] and advised according to Hippa i could not discuss patient with her but i called PT DPR and was advised of patient status and have sent message to Dr.Tullo.

## 2013-06-25 NOTE — Telephone Encounter (Signed)
Orders for electric hospitla bed,  Standing lift and air pressure mattress on printer

## 2013-06-25 NOTE — Telephone Encounter (Signed)
Thank you, find out if [facility] wants a direct call from Ms [name] or if threy can get the info they need form us or from their night sitter

## 2013-06-25 NOTE — Telephone Encounter (Signed)
Have tried multiple times to contact [facility] Left message with Harriett Sineancy [phone] to call office  . Called [facility] chair has been ordered and to call back with delivery date. [facility] [phone]. While writing note [facility]. FYI Please send message back to me so I can continue monitor progress on home care.

## 2013-06-26 ENCOUNTER — Other Ambulatory Visit: Payer: Self-pay | Admitting: *Deleted

## 2013-06-26 MED ORDER — ENALAPRIL MALEATE 10 MG PO TABS: 10.0000 mg | ORAL_TABLET | Freq: Every day | ORAL | Status: DC

## 2013-06-26 NOTE — Telephone Encounter (Signed)
[facility] as requested at 8.35 am today. Called DPR to inform patient supplies are at [facility] for delivery to home. Confirmed that [facility] has been in touch with family and was confirmed that Ms. Eric [name] has contacted family and is in process of arranging care for patient upon discharge FYI.

## 2013-06-26 NOTE — Telephone Encounter (Signed)
Requested Prescriptions   Pending Prescriptions Disp Refills  . enalapril (VASOTEC) 10 MG tablet 30 tablet 6    Sig: Take 1 tablet (10 mg total) by mouth daily.

## 2013-06-27 NOTE — Telephone Encounter (Signed)
[name] left vm asking Eric MessierKathy to call.  States she has some questions regarding Eric HeirMaurice Stehlik.  No further info given on vm.

## 2013-06-28 NOTE — Telephone Encounter (Signed)
Left message for patient s DPR to return call to office.

## 2013-07-05 ENCOUNTER — Other Ambulatory Visit: Payer: Self-pay | Admitting: Internal Medicine

## 2013-07-05 ENCOUNTER — Telehealth: Payer: Self-pay | Admitting: *Deleted

## 2013-07-05 DIAGNOSIS — E441 Mild protein-calorie malnutrition: Secondary | ICD-10-CM

## 2013-07-05 DIAGNOSIS — F0280 Dementia in other diseases classified elsewhere without behavioral disturbance: Secondary | ICD-10-CM

## 2013-07-05 DIAGNOSIS — S0990XS Unspecified injury of head, sequela: Principal | ICD-10-CM

## 2013-07-05 DIAGNOSIS — L89152 Pressure ulcer of sacral region, stage 2: Secondary | ICD-10-CM

## 2013-07-05 MED ORDER — MIRTAZAPINE 15 MG PO TABS: 15.0000 mg | ORAL_TABLET | Freq: Every day | ORAL | Status: DC

## 2013-07-05 NOTE — Telephone Encounter (Signed)
Spoke with Eric [name].  Eric [name] will not eat or take meds from her but will eat for other family members.  Discussed increasing his mirtazipine dose to the next level Eric Hatch([name] was not home and medication is not in chart) but she knows what to do.  Recommended enlisting family members to encourage him to eat.   Also discussed his comment to Dr Daleen SquibbWall about acknowledging the end of his life being near.  Hospice referral discussed and accepted.

## 2013-07-05 NOTE — Telephone Encounter (Signed)
Paulino RilyUnderway

## 2013-07-05 NOTE — Telephone Encounter (Signed)
Message was taken from front staff: Patient's wife Eric [name] is wanting call back from Dr. Darrick Huntsmanullo only . She wants to talk about her husband Eric [name] he is very sick.

## 2013-07-06 ENCOUNTER — Other Ambulatory Visit: Payer: Self-pay | Admitting: Internal Medicine

## 2013-07-06 NOTE — Telephone Encounter (Signed)
Already been addressed

## 2013-07-08 ENCOUNTER — Encounter: Payer: Self-pay | Admitting: Surgery

## 2013-07-12 ENCOUNTER — Telehealth: Payer: Self-pay | Admitting: *Deleted

## 2013-07-12 MED ORDER — LIDOCAINE 5 % EX PTCH: 1.0000 | MEDICATED_PATCH | CUTANEOUS | Status: DC

## 2013-07-12 NOTE — Telephone Encounter (Signed)
Yes,  Since I cant' sign a fentanyl order in person today,  Have them try the lidoderm.  i [facility]  Will sign a fentanyl patch tomorrow so he has it for the weekend if needed

## 2013-07-12 NOTE — Telephone Encounter (Signed)
[facility] home health nurse called and stated patient  Is having increased lower back pain and wanted know if you would prescribe a lido derm  Patch or fentanyl for patient stated patient is not taking oral medication as well .

## 2013-07-13 NOTE — Telephone Encounter (Signed)
Left message for home health nurse to return call to office.

## 2013-07-19 ENCOUNTER — Emergency Department: Payer: Self-pay | Admitting: Emergency Medicine

## 2013-08-05 ENCOUNTER — Encounter: Payer: Self-pay | Admitting: Surgery

## 2013-08-06 ENCOUNTER — Other Ambulatory Visit: Payer: Self-pay | Admitting: Internal Medicine

## 2013-08-06 NOTE — Telephone Encounter (Signed)
Ok refill? 

## 2013-08-07 NOTE — Telephone Encounter (Signed)
Ok to refill,  Refill sent  

## 2013-08-22 ENCOUNTER — Ambulatory Visit (INDEPENDENT_AMBULATORY_CARE_PROVIDER_SITE_OTHER): Payer: Medicare Other | Admitting: *Deleted

## 2013-08-22 DIAGNOSIS — I441 Atrioventricular block, second degree: Secondary | ICD-10-CM

## 2013-08-22 LAB — MDC_IDC_ENUM_SESS_TYPE_REMOTE
Battery Impedance: 186 Ohm
Brady Statistic RV Percent Paced: 87 %
Date Time Interrogation Session: 20150318134200
Lead Channel Impedance Value: 527 Ohm
Lead Channel Impedance Value: 67 Ohm
Lead Channel Pacing Threshold Pulse Width: 0.4 ms
Lead Channel Setting Pacing Pulse Width: 0.4 ms
Lead Channel Setting Sensing Sensitivity: 4 mV
MDC IDC MSMT BATTERY REMAINING LONGEVITY: 131 mo
MDC IDC MSMT BATTERY VOLTAGE: 2.78 V
MDC IDC MSMT LEADCHNL RV IMPEDANCE VALUE: 527 Ohm
MDC IDC MSMT LEADCHNL RV PACING THRESHOLD AMPLITUDE: 0.75 V
MDC IDC SET LEADCHNL RV PACING AMPLITUDE: 2.5 V

## 2013-08-31 ENCOUNTER — Other Ambulatory Visit: Payer: Self-pay

## 2013-08-31 MED ORDER — FINASTERIDE 5 MG PO TABS: 5.0000 mg | ORAL_TABLET | Freq: Every day | ORAL | Status: DC

## 2013-09-03 NOTE — Progress Notes (Signed)
PPM remote 

## 2013-09-04 ENCOUNTER — Encounter: Payer: Self-pay | Admitting: Internal Medicine

## 2013-09-04 ENCOUNTER — Ambulatory Visit (INDEPENDENT_AMBULATORY_CARE_PROVIDER_SITE_OTHER): Payer: Medicare Other | Admitting: Internal Medicine

## 2013-09-04 VITALS — BP 136/60 | HR 63 | Temp 98.0°F | Resp 16

## 2013-09-04 DIAGNOSIS — L8993 Pressure ulcer of unspecified site, stage 3: Secondary | ICD-10-CM

## 2013-09-04 DIAGNOSIS — Z79899 Other long term (current) drug therapy: Secondary | ICD-10-CM

## 2013-09-04 DIAGNOSIS — I10 Essential (primary) hypertension: Secondary | ICD-10-CM

## 2013-09-04 DIAGNOSIS — I25810 Atherosclerosis of coronary artery bypass graft(s) without angina pectoris: Secondary | ICD-10-CM

## 2013-09-04 DIAGNOSIS — M1990 Unspecified osteoarthritis, unspecified site: Secondary | ICD-10-CM

## 2013-09-04 DIAGNOSIS — E785 Hyperlipidemia, unspecified: Secondary | ICD-10-CM

## 2013-09-04 DIAGNOSIS — L89109 Pressure ulcer of unspecified part of back, unspecified stage: Secondary | ICD-10-CM

## 2013-09-04 DIAGNOSIS — L89153 Pressure ulcer of sacral region, stage 3: Secondary | ICD-10-CM

## 2013-09-04 DIAGNOSIS — Z23 Encounter for immunization: Secondary | ICD-10-CM

## 2013-09-04 DIAGNOSIS — R634 Abnormal weight loss: Secondary | ICD-10-CM

## 2013-09-04 DIAGNOSIS — D509 Iron deficiency anemia, unspecified: Secondary | ICD-10-CM

## 2013-09-04 DIAGNOSIS — D638 Anemia in other chronic diseases classified elsewhere: Secondary | ICD-10-CM

## 2013-09-04 DIAGNOSIS — I4891 Unspecified atrial fibrillation: Secondary | ICD-10-CM

## 2013-09-04 MED ORDER — DICLOFENAC SODIUM 1 % TD GEL: 2.0000 g | Freq: Four times a day (QID) | TRANSDERMAL | Status: DC

## 2013-09-04 MED ORDER — FINASTERIDE 5 MG PO TABS: 5.0000 mg | ORAL_TABLET | Freq: Every day | ORAL | Status: DC

## 2013-09-04 MED ORDER — DICLOFENAC SODIUM 1 % TD GEL: 2.0000 g | Freq: Four times a day (QID) | TRANSDERMAL | Status: AC

## 2013-09-04 MED ORDER — FINASTERIDE 5 MG PO TABS: 5.0000 mg | ORAL_TABLET | Freq: Every day | ORAL | Status: AC

## 2013-09-04 NOTE — Progress Notes (Signed)
Patient ID: Eric [name], male   DOB: 20-Aug-1925, 80 y.o.   MRN: [id number]   Patient Active Problem List   Diagnosis Date Noted  . Loss of weight 09/04/2013  . Sacral decubitus ulcer, stage III 06/07/2013  . Spondylosis, cervical, with myelopathy 06/07/2013  . Shortness of breath 12/01/2012  . ICH (intracerebral hemorrhage) 03/08/2012  . Hematoma of frontal scalp 03/08/2012  . Pleural effusion on right 03/08/2012  . Gastritis 01/13/2012  . Internal hemorrhoids 01/13/2012  . Iron deficiency anemia, unspecified 01/05/2012  . OSA (obstructive sleep apnea) 09/29/2011  . Chronic diastolic heart failure 04/23/3566  . Recurrent small bowel obstructions 04/07/2011  . Pulmonary nodule 03/10/2011  . Gastric stasis with food retention 02/17/2011  . Atrial fibrillation 03/19/2010  . MOBITZ II ATRIOVENTRICULAR BLOCK 02/03/2010  . PACEMAKER, PERMANENT 02/03/2010  . RBBB W/ LAFB 07/30/2009  . CAD, AUTOLOGOUS BYPASS GRAFT 03/06/2009  . OBSTRUCTIVE SLEEP APNEA 01/22/2008  . HYPERLIPIDEMIA 01/19/2008  . HYPERTENSION 01/19/2008  . CEREBROVASCULAR DISEASE 01/19/2008  . ABDOMINAL AORTIC ANEURYSM 01/19/2008  . DEGENERATIVE JOINT DISEASE 01/19/2008  . TRANSIENT ISCHEMIC ATTACKS, HX OF 01/19/2008  . BENIGN PROSTATIC HYPERTROPHY, HX OF 01/19/2008    Subjective:  CC:   Chief Complaint  Patient presents with  . Follow-up    3 month    HPI:   Eric [name] is a 80 y.o. male who presents for   3 month follow up on chronic conditions. Including recent hemorrhagic CVA, depression,  And sacral decubitus ulcer.    He is in general doing better all around since returning home from [place].  His wife is receiving 24 hour assistance from [place],  And nursing care for wound vac management by [place].    Depression: He didn't tolerate trial of remeron, but since his return home his apathy has resolved and his appetite has improved.   His wound is healing steadily ,  And still  requiring a  wound   Vac cared by Dominica the Rn from [facility]   Weight loss:  Diet reviewed.  He eats a full breakfast late,  And full dinner,. Light lunch    Past Medical History  Diagnosis Date  . BPH (benign prostatic hypertrophy)   . DJD (degenerative joint disease)   . Hx of transient ischemic attack (TIA)   . AAA (abdominal aortic aneurysm)   . Cerebrovascular disease   . HLD (hyperlipidemia)   . HTN (hypertension)   . Second degree AV block, Mobitz type II     s/p PPM  . CAD (coronary artery disease)   . Persistent atrial fibrillation     on pradaxa  . Sleep apnea     cpap machine  . Gastric ulcer, acute 02/2011    multiple - NSAID/Pradaxa thought causative. associated gastroparesis-like problems  . Small bowel obstruction     multiple over the years  . Anemia   . Anemia     Past Surgical History  Procedure Laterality Date  . Pacemaker insertion  01/24/10    Adapta Medtronic   . Coronary artery bypass graft    . Appendectomy    . Gastrectomy    . Knee arthroscopy      right, with medial and lateral meniscal  . Finger surgery       Left middle finger dorsal lesion  . Upper gastrointestinal endoscopy  03/12/2011    stomach ulcer, esophageal erosion  . Esophagogastroduodenoscopy  01/13/2012    Procedure: ESOPHAGOGASTRODUODENOSCOPY (EGD);  Surgeon: Gatha Mayer, MD;  Location: [facility];  Service: Endoscopy;  Laterality: N/A;  . Flexible sigmoidoscopy  01/13/2012    Procedure: FLEXIBLE SIGMOIDOSCOPY;  Surgeon: Gatha Mayer, MD;  Location: [facility];  Service: Endoscopy;  Laterality: N/A;  Fleets enema on arrival       The following portions of the patient's history were reviewed and updated as appropriate: Allergies, current medications, and problem list.    Review of Systems:   Patient denies headache, fevers, malaise, unintentional weight loss, skin rash, eye pain, sinus congestion and sinus pain, sore throat, dysphagia,  hemoptysis , cough,  dyspnea, wheezing, chest pain, palpitations, orthopnea, edema, abdominal pain, nausea, melena, diarrhea, constipation, flank pain, dysuria, hematuria, urinary  Frequency, nocturia, numbness, tingling, seizures,  Focal weakness, Loss of consciousness,  Tremor, insomnia, depression, anxiety, and suicidal ideation.     History   Social History  . Marital Status: Married    Spouse Name: N/A    Number of Children: N/A  . Years of Education: N/A   Occupational History  . retired    Social History Main Topics  . Smoking status: Former Smoker -- 1.00 packs/day for 5 years    Types: Cigarettes    Quit date: 06/07/1968  . Smokeless tobacco: Never Used  . Alcohol Use: No  . Drug Use: No  . Sexual Activity: Not on file   Other Topics Concern  . Not on file   Social History Narrative  . No narrative on file    Objective:  Filed Vitals:   09/04/13 1215  BP: 136/60  Pulse: 63  Temp: 98 F (36.7 C)  Resp: 16     General appearance: alert, cooperative and appears stated age Ears: normal TM's and external ear canals both ears Throat: lips, mucosa, and tongue normal; teeth and gums normal Neck: no adenopathy, no carotid bruit, supple, symmetrical, trachea midline and thyroid not enlarged, symmetric, no tenderness/mass/nodules Back: symmetric, no curvature. ROM normal. No CVA tenderness. Lungs: clear to auscultation bilaterally Heart: regular rate and rhythm, S1, S2 normal, no murmur, click, rub or gallop Abdomen: soft, non-tender; bowel sounds normal; no masses,  no organomegaly Pulses: 2+ and symmetric Skin: Skin color, texture, turgor normal. No rashes or lesions Lymph nodes: Cervical, supraclavicular, and axillary nodes normal.  Assessment and Plan:  HYPERLIPIDEMIA Managed with statin therapy. Liver function assessment is due.  Lab Results  Component Value Date   CHOL 98 03/22/2011   HDL 56 02/11/2009   LDLCALC 59 02/11/2009   TRIG 52 03/24/2011   CHOLHDL 2.2 Ratio  02/11/2009    HYPERTENSION Well controlled on current regimen. Renal function stable, no changes today.  Lab Results  Component Value Date   CREATININE 1.1 05/09/2013     DEGENERATIVE JOINT DISEASE Managed with voltaren gel and prn tramadol   Iron deficiency anemia, unspecified Nearly resolved, with repeat hgb due,  Lab Results  Component Value Date   HGB 11.3* 05/09/2013     Sacral decubitus ulcer, stage III Improving with treatment of infection, debridement and wound vac  Loss of weight Secondary to decreased appetite and mood disorder.  Checking prealbumin ,     Updated Medication List Outpatient Encounter Prescriptions as of 09/04/2013  Medication Sig  . aspirin EC 325 MG EC tablet Take 1 tablet (325 mg total) by mouth daily.  . carbidopa-levodopa (SINEMET IR) 25-100 MG per tablet 1/2 in morning 1/2 at night  . Coenzyme Q10 (CO Q-10) 100 MG CAPS Take 100 mg  by mouth daily.  . diclofenac sodium (VOLTAREN) 1 % GEL Apply 2 g topically 4 (four) times daily. To hip or knees  . enalapril (VASOTEC) 10 MG tablet TAKE 1 TABLET EVERY DAY  . enalapril (VASOTEC) 10 MG tablet Take 1 tablet (10 mg total) by mouth daily.  . finasteride (PROSCAR) 5 MG tablet Take 1 tablet (5 mg total) by mouth daily.  . furosemide (LASIX) 20 MG tablet TAKE 1 TABLET EVERY DAY  . furosemide (LASIX) 20 MG tablet Take 1 tablet (20 mg total) by mouth daily.  Marland Kitchen KLOR-CON M20 20 MEQ tablet TAKE 1 TABLET EVERY DAY  . lidocaine (LIDODERM) 5 % APPLY ONE PATCH DAILY (ON 12 HOURS, OFF 12 HOURS)  . Multiple Vitamin (MULTIVITAMINS PO) Take 1 tablet by mouth daily.    Marland Kitchen NAMENDA 10 MG tablet Take 10 mg by mouth daily.   . pantoprazole (PROTONIX) 40 MG tablet TAKE ONE TABLET EVERY MORNING  . simvastatin (ZOCOR) 20 MG tablet TAKE ONE TABLET EVERY EVENING  . VITAMIN D, CHOLECALCIFEROL, PO Take 5,000 Units by mouth daily.  . [DISCONTINUED] diclofenac sodium (VOLTAREN) 1 % GEL Apply 2 g topically 4 (four) times daily. To  hip  . [DISCONTINUED] diclofenac sodium (VOLTAREN) 1 % GEL Apply 2 g topically 4 (four) times daily. To hip  . [DISCONTINUED] diclofenac sodium (VOLTAREN) 1 % GEL Apply 2 g topically 4 (four) times daily. To hip  . [DISCONTINUED] finasteride (PROSCAR) 5 MG tablet Take 1 tablet (5 mg total) by mouth daily.  . [DISCONTINUED] finasteride (PROSCAR) 5 MG tablet Take 1 tablet (5 mg total) by mouth daily.  . [DISCONTINUED] mirtazapine (REMERON) 15 MG tablet TAKE ONE TABLET AT BEDTIME     Orders Placed This Encounter  Procedures  . Pneumococcal conjugate vaccine 13-valent  . Comp Met (CMET)  . Prealbumin  . CBC with Differential    No Follow-up on file.

## 2013-09-04 NOTE — Assessment & Plan Note (Signed)
Secondary to decreased appetite and mood disorder.  Checking prealbumin ,

## 2013-09-04 NOTE — Assessment & Plan Note (Signed)
Nearly resolved, with repeat hgb due,  Lab Results  Component Value Date   HGB 11.3* 05/09/2013

## 2013-09-04 NOTE — Assessment & Plan Note (Signed)
Well controlled on current regimen. Renal function stable, no changes today.  Lab Results  Component Value Date   CREATININE 1.1 05/09/2013

## 2013-09-04 NOTE — Patient Instructions (Addendum)
[name] is doing very well!!!!   There were no medication changes today  For the ear wax accumulation in the right ear you can use Debrox OTC drops to soften the earwax.   If you would like [name] to flush it, you can make an appt for a nurse visit next Tuesday  Morning or  next  Thursday afternoon   He received the prevnar vaccine today  I will see him again in 3 months

## 2013-09-04 NOTE — Progress Notes (Signed)
Pre-visit discussion using our clinic review tool. No additional management support is needed unless otherwise documented below in the visit note.  

## 2013-09-04 NOTE — Assessment & Plan Note (Signed)
Managed with statin therapy. Liver function assessment is due.  Lab Results  Component Value Date   CHOL 98 03/22/2011   HDL 56 02/11/2009   LDLCALC 59 02/11/2009   TRIG 52 03/24/2011   CHOLHDL 2.2 Ratio 02/11/2009

## 2013-09-04 NOTE — Assessment & Plan Note (Signed)
Managed with voltaren gel and prn tramadol

## 2013-09-04 NOTE — Assessment & Plan Note (Signed)
Improving with treatment of infection, debridement and wound vac

## 2013-09-05 ENCOUNTER — Encounter: Payer: Self-pay | Admitting: Surgery

## 2013-09-05 ENCOUNTER — Encounter: Payer: Self-pay | Admitting: *Deleted

## 2013-09-05 LAB — SPECIMEN STATUS REPORT

## 2013-09-05 LAB — FERRITIN: Ferritin: 105 ng/mL (ref 30–400)

## 2013-09-07 LAB — COMPREHENSIVE METABOLIC PANEL
A/G RATIO: 1.1 (ref 1.1–2.5)
ALBUMIN: 4.1 g/dL (ref 3.5–4.7)
ALK PHOS: 95 IU/L (ref 39–117)
ALT: 21 IU/L (ref 0–44)
AST: 31 IU/L (ref 0–40)
BILIRUBIN TOTAL: 0.4 mg/dL (ref 0.0–1.2)
BUN / CREAT RATIO: 25 — AB (ref 10–22)
BUN: 26 mg/dL (ref 8–27)
CO2: 22 mmol/L (ref 18–29)
CREATININE: 1.02 mg/dL (ref 0.76–1.27)
Calcium: 8.1 mg/dL — ABNORMAL LOW (ref 8.6–10.2)
Chloride: 109 mmol/L — ABNORMAL HIGH (ref 97–108)
GFR, EST AFRICAN AMERICAN: 77 mL/min/{1.73_m2} (ref >59)
GFR, EST NON AFRICAN AMERICAN: 67 mL/min/{1.73_m2} (ref >59)
GLOBULIN, TOTAL: 3.8 g/dL (ref 1.5–4.5)
GLUCOSE: 122 mg/dL — AB (ref 65–99)
Potassium: 6.5 mmol/L — ABNORMAL HIGH (ref 3.5–5.2)
Sodium: 154 mmol/L — ABNORMAL HIGH (ref 134–144)
TOTAL PROTEIN: 7.9 g/dL (ref 6.0–8.5)

## 2013-09-07 LAB — SPECIMEN STATUS REPORT

## 2013-09-11 LAB — SPECIMEN STATUS REPORT

## 2013-09-11 LAB — IRON AND TIBC
IRON: 45 ug/dL (ref 40–155)
Iron Saturation: 16 % (ref 15–55)
TIBC: 289 ug/dL (ref 250–450)
UIBC: 244 ug/dL (ref 150–375)

## 2013-09-11 LAB — PREALBUMIN: PREALBUMIN: 23 mg/dL (ref 20–40)

## 2013-09-12 ENCOUNTER — Telehealth: Payer: Self-pay | Admitting: Internal Medicine

## 2013-09-12 NOTE — Telephone Encounter (Signed)
The labs he had done last week were way off bc the blood had hemolyzed and has to be repeated because the soidum and potassium were affected .  Can the home health RN draw a BMEt when she does his wound care? If so we can save him a trip to the office

## 2013-09-12 NOTE — Telephone Encounter (Signed)
Home health nurse does not come until Friday need order for home health to draw.

## 2013-09-13 ENCOUNTER — Other Ambulatory Visit: Payer: Self-pay | Admitting: *Deleted

## 2013-09-13 ENCOUNTER — Telehealth: Payer: Self-pay | Admitting: *Deleted

## 2013-09-13 ENCOUNTER — Other Ambulatory Visit: Payer: Self-pay | Admitting: Internal Medicine

## 2013-09-13 DIAGNOSIS — R197 Diarrhea, unspecified: Secondary | ICD-10-CM

## 2013-09-13 NOTE — Telephone Encounter (Signed)
Please ask for last Wound Center note on Mr Eric [name] to be faxed over today or tomorrow so I can be up top date. Monday meeting with Ms Eric [name] is fine

## 2013-09-13 NOTE — Telephone Encounter (Signed)
Lab order printed and patient caregiver notified to pick up stool kit.

## 2013-09-13 NOTE — Telephone Encounter (Signed)
Patient wife came in picked up stool kit for patient while here she would like to discuss with you that the wound center is recommending a skin Flap for patients wound and Ms. Sponaugle would like your recommendations. Only place I have would be on a Monday Night. Please advise.

## 2013-09-13 NOTE — Telephone Encounter (Signed)
Left message for Mrs. Eric [name] to call office.

## 2013-09-13 NOTE — Telephone Encounter (Signed)
Need order for lab draw for [facility] nurse is Dominica. Patient caregiver is reporting patient has had several BM in the last 24 hours that are loose in consistency and has strong foul odor. Would you want patient to come in or to pick up stool kit to check for C-Diff?

## 2013-09-14 ENCOUNTER — Encounter: Payer: Self-pay | Admitting: Internal Medicine

## 2013-09-14 LAB — BASIC METABOLIC PANEL
BUN: 25 mg/dL — AB (ref 4–21)
Creatinine: 1 mg/dL (ref 0.6–1.3)
POTASSIUM: 4.1 mmol/L (ref 3.4–5.3)
SODIUM: 141 mmol/L (ref 137–147)

## 2013-09-14 LAB — CLOSTRIDIUM DIFFICILE EIA: CDIFTX: NEGATIVE

## 2013-09-14 NOTE — Telephone Encounter (Signed)
Called wound center spoke with Lorin PicketScott stated will fax over latest notes.FYI

## 2013-09-17 ENCOUNTER — Ambulatory Visit: Payer: Medicare Other | Admitting: Internal Medicine

## 2013-09-17 ENCOUNTER — Telehealth: Payer: Self-pay | Admitting: Internal Medicine

## 2013-09-17 LAB — STOOL CULTURE

## 2013-09-17 MED ORDER — POTASSIUM CHLORIDE 40 MEQ/15ML (20%) PO LIQD: 20.0000 meq | Freq: Every day | ORAL | Status: DC

## 2013-09-18 ENCOUNTER — Ambulatory Visit: Payer: Medicare Other | Admitting: Internal Medicine

## 2013-09-19 ENCOUNTER — Telehealth: Payer: Self-pay | Admitting: Internal Medicine

## 2013-09-19 NOTE — Telephone Encounter (Signed)
Patient notified

## 2013-09-19 NOTE — Telephone Encounter (Signed)
Repeat las were normal.,  Potassium is 4.1

## 2013-09-21 ENCOUNTER — Telehealth: Payer: Self-pay | Admitting: Internal Medicine

## 2013-09-21 NOTE — Telephone Encounter (Signed)
Left vm.  States pt is coughing up a yellow thick substance.  He sounds like he has a croupy cough.  Wanted to see if Dr. Darrick Huntsmanullo wanted to do something.

## 2013-09-21 NOTE — Telephone Encounter (Signed)
Patient giver stated patient is better now that just scared her this morning that he has not coughed anything else up . Will advise office if any change.

## 2013-10-03 ENCOUNTER — Telehealth: Payer: Self-pay | Admitting: Internal Medicine

## 2013-10-03 MED ORDER — DEXTROMETHORPHAN POLISTIREX 30 MG/5ML PO LQCR: 30.0000 mg | Freq: Two times a day (BID) | ORAL | Status: DC

## 2013-10-03 MED ORDER — AZITHROMYCIN 500 MG PO TABS: 500.0000 mg | ORAL_TABLET | Freq: Every day | ORAL | Status: DC

## 2013-10-03 NOTE — Telephone Encounter (Signed)
Patient Information:  Caller Name: Dewayne Hatchnn  Phone: [phone]  Patient: Eric [name], [name]  Gender: Male  DOB: [birth year]  Age: 80 Years  PCP: Duncan Dullullo, Teresa (Adults only)  Office Follow Up:  Does the office need to follow up with this patient?: Yes  Instructions For The Office: Spouse asks what else she can do for his croupy cough producing tan sputum?  Should she continue Mucinex?  She declined appointment.   Symptoms  Reason For Call & Symptoms: Cough x 1 week producing "discolored" mucus.  Afebrile/tactile.  Home nurse assessed him 10/03/13 am and advised to call; "she didn't hear anything... he was asleep and unable to get him to take a deep breath".   Croupy cough wakes him from sleep. Clarified color of mucus is tan.  Emergent symptoms ruled out.  See Today in Office per nursing judgment.  Reviewed Health History In EMR: Yes  Reviewed Medications In EMR: Yes  Reviewed Allergies In EMR: Yes  Reviewed Surgeries / Procedures: Yes  Date of Onset of Symptoms: 09/26/2013  Treatments Tried: Mucinex  Treatments Tried Worked: No  Guideline(s) Used:  Cough  Disposition Per Guideline:   See Today in Office  Reason For Disposition Reached:   Known COPD or other severe lung disease (i.e., bronchiectasis, cystic fibrosis, lung surgery) and worsening symptoms (i.e., increased sputum purulence or amount, increased breathing difficulty)  Advice Given:  Reassurance  Coughing is the way that our lungs remove irritants and mucus. It helps protect our lungs from getting pneumonia.  You can also get a cough after being exposed to irritating substances like smoke, strong perfumes, and dust.  Here is some care advice that should help.  Coughing Spasms:  Drink warm fluids. Inhale warm mist (Reason: both relax the airway and loosen up the phlegm).  Suck on cough drops or hard candy to coat the irritated throat.  Prevent Dehydration:  Drink adequate liquids.  This will help soothe an irritated or  dry throat and loosen up the phlegm.  Fever Medicines:  For fevers above 101 F (38.3 C) take either acetaminophen or ibuprofen.  They are over-the-counter (OTC) drugs that help treat both fever and pain. You can buy them at the drugstore.l  The goal of fever therapy is to bring the fever down to a comfortable level. Remember that fever medicine usually lowers fever 2 degrees F (1 - 1 1/2 degrees C).  Call Back If:  Difficulty breathing  You become worse.  Patient Refused Recommendation:  Patient Refused Care Advice  Caller asks what other home care measures can be done.  Should she continue Mucinex. She relates it is difficult to get him to office due to he is nonambulatory.

## 2013-10-03 NOTE — Telephone Encounter (Signed)
Please advise 

## 2013-10-03 NOTE — Telephone Encounter (Signed)
[name] notified and verbalized understanding. States pt is not wheezing. Advised to call back with worsening or persistent symptoms.

## 2013-10-03 NOTE — Telephone Encounter (Signed)
I will call in azithromycin 500 mg daily x 7 days ,  And they can give hinm delsym for the cough. Available otc  If he is having any wheezing let me know

## 2013-10-05 ENCOUNTER — Telehealth: Payer: Self-pay

## 2013-10-05 ENCOUNTER — Encounter: Payer: Self-pay | Admitting: Surgery

## 2013-10-05 ENCOUNTER — Other Ambulatory Visit: Payer: Self-pay

## 2013-10-05 MED ORDER — ENALAPRIL MALEATE 10 MG PO TABS: 15.0000 mg | ORAL_TABLET | Freq: Every day | ORAL | Status: DC

## 2013-10-05 NOTE — Telephone Encounter (Signed)
Patient states taking Enalapril 10 mg 1 1/2 tablet daily. Please confirm if this is correct? I did not see this dose in medication list or any telephone calls.

## 2013-10-05 NOTE — Telephone Encounter (Signed)
Spoke w/ pt's wife.  She reports that pt's enalapril was increased to 10mg  1.5 tabs daily by [facility], as pt's BP had been running high. Advised her that I will update pt's chart.  She asks that a refill be sent in to pharmacy.

## 2013-10-16 ENCOUNTER — Other Ambulatory Visit: Payer: Self-pay | Admitting: Cardiology

## 2013-10-16 ENCOUNTER — Other Ambulatory Visit: Payer: Self-pay

## 2013-10-16 MED ORDER — SIMVASTATIN 20 MG PO TABS: ORAL_TABLET | ORAL | Status: DC

## 2013-10-16 MED ORDER — FUROSEMIDE 20 MG PO TABS: 20.0000 mg | ORAL_TABLET | Freq: Every day | ORAL | Status: DC

## 2013-10-16 NOTE — Telephone Encounter (Signed)
Refill sent for furosemide & simvastatin.

## 2013-10-17 ENCOUNTER — Telehealth: Payer: Self-pay | Admitting: Internal Medicine

## 2013-10-17 MED ORDER — NYSTATIN 100000 UNIT/GM EX CREA: 1.0000 "application " | TOPICAL_CREAM | Freq: Two times a day (BID) | CUTANEOUS | Status: DC

## 2013-10-17 NOTE — Telephone Encounter (Signed)
Nystatin cream sent to [facility],  Apply bid until resolved.

## 2013-10-17 NOTE — Telephone Encounter (Signed)
Eric [name] left vm.  States nurse was there this morning and said that pt has yeast infection in upper part of leg.  Advised to call and get something from Dr. Darrick Huntsmanullo for it.

## 2013-10-17 NOTE — Telephone Encounter (Signed)
Please advise 

## 2013-10-17 NOTE — Telephone Encounter (Signed)
Left message, notifying Rx was sent to pharmacy and directions.

## 2013-10-23 ENCOUNTER — Encounter: Payer: Self-pay | Admitting: Internal Medicine

## 2013-10-26 ENCOUNTER — Telehealth: Payer: Self-pay | Admitting: Internal Medicine

## 2013-10-26 NOTE — Telephone Encounter (Signed)
Eric [name] with [facility] stopped by to leave order for Dr. Harvie Bridge

## 2013-10-26 NOTE — Telephone Encounter (Signed)
Paperwork placed in Dr. Melina Schools folder.

## 2013-11-05 ENCOUNTER — Encounter: Payer: Self-pay | Admitting: Surgery

## 2013-11-05 ENCOUNTER — Ambulatory Visit (INDEPENDENT_AMBULATORY_CARE_PROVIDER_SITE_OTHER): Payer: Medicare Other | Admitting: Cardiovascular Disease

## 2013-11-05 ENCOUNTER — Encounter: Payer: Self-pay | Admitting: Cardiovascular Disease

## 2013-11-05 VITALS — BP 138/82 | HR 62 | Ht 71.0 in

## 2013-11-05 DIAGNOSIS — I5032 Chronic diastolic (congestive) heart failure: Secondary | ICD-10-CM

## 2013-11-05 DIAGNOSIS — I714 Abdominal aortic aneurysm, without rupture, unspecified: Secondary | ICD-10-CM

## 2013-11-05 DIAGNOSIS — I10 Essential (primary) hypertension: Secondary | ICD-10-CM

## 2013-11-05 DIAGNOSIS — I4891 Unspecified atrial fibrillation: Secondary | ICD-10-CM

## 2013-11-05 DIAGNOSIS — I25810 Atherosclerosis of coronary artery bypass graft(s) without angina pectoris: Secondary | ICD-10-CM

## 2013-11-05 NOTE — Patient Instructions (Signed)
You are doing well. No medication changes were made.  We will schedule you for a aorta ultrasound  Search for [facility] lift on [address]  Please call us if you have new issues that need to be addressed before your next appt.  Your physician wants you to follow-up in: 6 months.  You will receive a reminder letter in the mail two months in advance. If you don't receive a letter, please call our office to schedule the follow-up appointment.

## 2013-11-05 NOTE — Assessment & Plan Note (Signed)
Currently with no symptoms of angina. No further workup at this time. Continue current medication regimen. 

## 2013-11-05 NOTE — Progress Notes (Signed)
Patient ID: Eric [name], male    DOB: 07-13-1925, 80 y.o.   MRN: [id number]  HPI Comments: Eric [name] is a pleasant 80 year old gentleman with history of coronary disease, CABG,  abdominal aortic aneurysm estimated at 3.9 cm, second degree AV block type II with pacemaker, chronic atrial fibrillation not on anticoagulation secondary to high fall risk  history of falls, walks with a walker, relatively sedentary secondary to weak legs who presents for routine followup. Wife reports some door hematoma 2 years ago October 2013 after a fall He has not been on blood thinners for atrial fibrillation given history of falls  In presentation today, he is not very verbal, wife does most of the talking. He is no longer walking, uses a lift to transfer from bed to chair. Does his bathing in the bed. Reasonable appetite. No significant leg edema. Some skin breakdown over one of his hips now with a wound VAC. In general blood pressure has been well-controlled  Does not complain about any new symptoms on today's visit and overall reports he is doing well.   Prior hospitalization in October 2014 for a fall. Intracranial bleed at that time. EKG today shows atrial fibrillation with rate 62 beats per minute, paced rhythm   Past Medical History Diagnosis Date . BPH (benign prostatic hypertrophy)  . DJD (degenerative joint disease)  . Hx of transient ischemic attack (TIA)  . AAA (abdominal aortic aneurysm)  . Cerebrovascular disease  . HLD (hyperlipidemia)  . HTN (hypertension)  . Second degree AV block, Mobitz type II    s/p PPM . CAD (coronary artery disease)  . Persistent atrial fibrillation  . Sleep apnea    cpap machine . Gastric ulcer, acute 02/2011   multiple - NSAID/Pradaxa thought causative. associated gastroparesis-like problems . Small bowel obstruction    multiple over the years . Anemia  . Anemia     Outpatient Encounter Prescriptions as of 11/05/2013  Medication Sig  . aspirin EC 325  MG EC tablet Take 1 tablet (325 mg total) by mouth daily.  . carbidopa-levodopa (SINEMET IR) 25-100 MG per tablet 1/2 in morning 1/2 at night  . Coenzyme Q10 (CO Q-10) 100 MG CAPS Take 100 mg by mouth daily.  . diclofenac sodium (VOLTAREN) 1 % GEL Apply 2 g topically 4 (four) times daily. To hip or knees  . enalapril (VASOTEC) 10 MG tablet Take 1.5 tablets (15 mg total) by mouth daily.  . finasteride (PROSCAR) 5 MG tablet Take 1 tablet (5 mg total) by mouth daily.  . furosemide (LASIX) 20 MG tablet Take 1 tablet (20 mg total) by mouth daily.  Marland Kitchen KLOR-CON M20 20 MEQ tablet TAKE 1 TABLET EVERY DAY  . lidocaine (LIDODERM) 5 % APPLY ONE PATCH DAILY (ON 12 HOURS, OFF 12 HOURS)  . Multiple Vitamin (MULTIVITAMINS PO) Take 1 tablet by mouth daily.    Marland Kitchen NAMENDA 10 MG tablet Take 10 mg by mouth daily.   Marland Kitchen nystatin cream (MYCOSTATIN) Apply 1 application topically 2 (two) times daily.  . pantoprazole (PROTONIX) 40 MG tablet TAKE ONE TABLET EVERY MORNING  . potassium chloride 40 MEQ/15ML (20%) LIQD Take 7.5 mLs (20 mEq total) by mouth daily at 8 pm.  . simvastatin (ZOCOR) 20 MG tablet TAKE ONE TABLET EVERY EVENING  . VITAMIN D, CHOLECALCIFEROL, PO Take 5,000 Units by mouth daily.    Review of Systems  Unable to perform ROS Constitutional: Negative.   HENT: Negative.   Eyes: Negative.   Respiratory: Negative.  Cardiovascular: Negative.   Gastrointestinal: Negative.   Endocrine: Negative.   Musculoskeletal: Positive for gait problem.  Skin: Negative.   Allergic/Immunologic: Negative.   Neurological: Negative.   Hematological: Negative.   Psychiatric/Behavioral: Negative.   All other systems reviewed and are negative.   BP 138/82  Pulse 62  Ht 5\' 11"  (1.803 m)  Physical Exam  Nursing note and vitals reviewed. Constitutional: He is oriented to person, place, and time. He appears well-developed and well-nourished.  Relatively nonverbal, sitting in a wheelchair  HENT:  Head: Normocephalic.   Nose: Nose normal.  Mouth/Throat: Oropharynx is clear and moist.  Eyes: Conjunctivae are normal. Pupils are equal, round, and reactive to light.  Neck: Normal range of motion. Neck supple. No JVD present.  Cardiovascular: Normal rate, regular rhythm, S1 normal, S2 normal, normal heart sounds and intact distal pulses.  Exam reveals no gallop and no friction rub.   No murmur heard. Pulmonary/Chest: Effort normal and breath sounds normal. No respiratory distress. He has no wheezes. He has no rales. He exhibits no tenderness.  Abdominal: Soft. Bowel sounds are normal. He exhibits no distension. There is no tenderness.  Musculoskeletal: Normal range of motion. He exhibits no edema and no tenderness.  Lymphadenopathy:    He has no cervical adenopathy.  Neurological: He is alert and oriented to person, place, and time. Coordination normal.  Skin: Skin is warm and dry. No rash noted. No erythema.  Psychiatric: His behavior is normal.      Assessment and Plan

## 2013-11-05 NOTE — Assessment & Plan Note (Signed)
Long discussion about his atrial fibrillation and anticoagulation. He is a poor candidate for anticoagulation given high fall risk even though he is relatively bedbound. Wife prefers to continue on aspirin at this time. Risk and benefit was discussed at length

## 2013-11-05 NOTE — Assessment & Plan Note (Signed)
Patient's wife is requesting repeat ultrasound. We'll try to arrange this for him for his one-year followup

## 2013-11-05 NOTE — Assessment & Plan Note (Signed)
Blood pressure is well controlled on today's visit. No changes made to the medications. 

## 2013-11-05 NOTE — Assessment & Plan Note (Signed)
Appears relatively euvolemic on today's visit. No medication changes made

## 2013-11-20 ENCOUNTER — Other Ambulatory Visit: Payer: Self-pay | Admitting: Internal Medicine

## 2013-11-23 ENCOUNTER — Encounter (INDEPENDENT_AMBULATORY_CARE_PROVIDER_SITE_OTHER): Payer: Medicare Other

## 2013-11-23 ENCOUNTER — Telehealth: Payer: Self-pay

## 2013-11-23 ENCOUNTER — Ambulatory Visit (INDEPENDENT_AMBULATORY_CARE_PROVIDER_SITE_OTHER): Payer: Medicare Other

## 2013-11-23 DIAGNOSIS — R197 Diarrhea, unspecified: Secondary | ICD-10-CM

## 2013-11-23 DIAGNOSIS — I714 Abdominal aortic aneurysm, without rupture, unspecified: Secondary | ICD-10-CM

## 2013-11-23 NOTE — Telephone Encounter (Signed)
Per Dr. Mariah MillingGollan, BMET to be drawn today.

## 2013-11-23 NOTE — Telephone Encounter (Signed)
Patient is having diarrhea and feels it's coming from the potassium 20 meq taking one tablet daily. Please advise if the patient can stop the potassium.

## 2013-11-23 NOTE — Telephone Encounter (Signed)
Patient is having an U/S now.

## 2013-11-24 LAB — BASIC METABOLIC PANEL
BUN / CREAT RATIO: 25 — AB (ref 10–22)
BUN: 26 mg/dL (ref 8–27)
CALCIUM: 9.1 mg/dL (ref 8.6–10.2)
CHLORIDE: 96 mmol/L — AB (ref 97–108)
CO2: 22 mmol/L (ref 18–29)
CREATININE: 1.02 mg/dL (ref 0.76–1.27)
GFR calc Af Amer: 77 mL/min/{1.73_m2} (ref >59)
GFR calc non Af Amer: 66 mL/min/{1.73_m2} (ref >59)
Glucose: 67 mg/dL (ref 65–99)
Potassium: 4.8 mmol/L (ref 3.5–5.2)
Sodium: 136 mmol/L (ref 134–144)

## 2013-11-26 ENCOUNTER — Ambulatory Visit (INDEPENDENT_AMBULATORY_CARE_PROVIDER_SITE_OTHER): Payer: Medicare Other | Admitting: *Deleted

## 2013-11-26 ENCOUNTER — Telehealth: Payer: Self-pay | Admitting: Cardiology

## 2013-11-26 DIAGNOSIS — I441 Atrioventricular block, second degree: Secondary | ICD-10-CM

## 2013-11-26 LAB — MDC_IDC_ENUM_SESS_TYPE_REMOTE
Battery Impedance: 235 Ohm
Battery Remaining Longevity: 122 mo
Battery Voltage: 2.78 V
Lead Channel Setting Pacing Amplitude: 2.5 V
Lead Channel Setting Pacing Pulse Width: 0.4 ms
MDC IDC MSMT LEADCHNL RA IMPEDANCE VALUE: 67 Ohm
MDC IDC MSMT LEADCHNL RV IMPEDANCE VALUE: 505 Ohm
MDC IDC MSMT LEADCHNL RV PACING THRESHOLD AMPLITUDE: 0.75 V
MDC IDC MSMT LEADCHNL RV PACING THRESHOLD PULSEWIDTH: 0.4 ms
MDC IDC SESS DTM: 20150622135757
MDC IDC SET LEADCHNL RV SENSING SENSITIVITY: 4 mV
MDC IDC STAT BRADY RV PERCENT PACED: 86 %

## 2013-11-26 NOTE — Telephone Encounter (Signed)
Spoke with pt and reminded pt of remote transmission that is due today. Pt verbalized understanding.   

## 2013-11-26 NOTE — Progress Notes (Signed)
Remote pacemaker transmission.   

## 2013-12-03 ENCOUNTER — Other Ambulatory Visit: Payer: Self-pay | Admitting: Internal Medicine

## 2013-12-03 DIAGNOSIS — M4712 Other spondylosis with myelopathy, cervical region: Secondary | ICD-10-CM

## 2013-12-03 NOTE — Telephone Encounter (Signed)
Last OV 3.31.15, last refill 5.29.15.  Please advise refill.

## 2013-12-04 NOTE — Telephone Encounter (Signed)
Ok to refill,  Refill sent  

## 2013-12-04 NOTE — Assessment & Plan Note (Signed)
I have reviewed the dangers of lidocaine transdermal to the elderly with patient and patient's primary caregiver.  Alternative medications have been discussed and tried with risk of more adverse side effects appreciated.

## 2013-12-05 ENCOUNTER — Encounter: Payer: Self-pay | Admitting: Surgery

## 2013-12-12 ENCOUNTER — Encounter: Payer: Self-pay | Admitting: Internal Medicine

## 2013-12-14 ENCOUNTER — Other Ambulatory Visit: Payer: Self-pay | Admitting: Cardiovascular Disease

## 2013-12-24 ENCOUNTER — Telehealth: Payer: Self-pay | Admitting: *Deleted

## 2013-12-24 NOTE — Telephone Encounter (Signed)
Lmom to call our office. Time to schedule a carotid u/s (1yr f/u). 

## 2014-01-01 ENCOUNTER — Telehealth: Payer: Self-pay | Admitting: *Deleted

## 2014-01-01 NOTE — Telephone Encounter (Signed)
Repeat carotid is not needed thx

## 2014-01-01 NOTE — Telephone Encounter (Signed)
Patient's wife wants to know if he needs another carotid doppler, due to being difficult to lift. Please call

## 2014-01-02 NOTE — Telephone Encounter (Signed)
Left detailed message on Eric [name]'s vm.  Asked her to call w/ any questions or concerns.

## 2014-01-05 ENCOUNTER — Encounter: Payer: Self-pay | Admitting: Surgery

## 2014-02-05 ENCOUNTER — Encounter: Payer: Self-pay | Admitting: Surgery

## 2014-02-21 ENCOUNTER — Other Ambulatory Visit: Payer: Self-pay | Admitting: Cardiovascular Disease

## 2014-02-27 ENCOUNTER — Ambulatory Visit (INDEPENDENT_AMBULATORY_CARE_PROVIDER_SITE_OTHER): Payer: Medicare Other | Admitting: Internal Medicine

## 2014-02-27 ENCOUNTER — Encounter: Payer: Self-pay | Admitting: Internal Medicine

## 2014-02-27 VITALS — BP 132/72 | HR 64 | Ht 71.0 in

## 2014-02-27 DIAGNOSIS — I480 Paroxysmal atrial fibrillation: Secondary | ICD-10-CM

## 2014-02-27 DIAGNOSIS — I442 Atrioventricular block, complete: Secondary | ICD-10-CM

## 2014-02-27 DIAGNOSIS — I25810 Atherosclerosis of coronary artery bypass graft(s) without angina pectoris: Secondary | ICD-10-CM

## 2014-02-27 DIAGNOSIS — I4891 Unspecified atrial fibrillation: Secondary | ICD-10-CM

## 2014-02-27 LAB — MDC_IDC_ENUM_SESS_TYPE_INCLINIC
Battery Impedance: 210 Ohm
Battery Remaining Longevity: 126 mo
Battery Voltage: 2.78 V
Lead Channel Impedance Value: 67 Ohm
Lead Channel Pacing Threshold Amplitude: 0.5 V
Lead Channel Sensing Intrinsic Amplitude: 11.2 mV
Lead Channel Setting Pacing Amplitude: 2.5 V
MDC IDC MSMT LEADCHNL RV IMPEDANCE VALUE: 505 Ohm
MDC IDC MSMT LEADCHNL RV PACING THRESHOLD PULSEWIDTH: 0.4 ms
MDC IDC SESS DTM: 20150923142721
MDC IDC SET LEADCHNL RV PACING PULSEWIDTH: 0.4 ms
MDC IDC SET LEADCHNL RV SENSING SENSITIVITY: 4 mV
MDC IDC STAT BRADY RV PERCENT PACED: 87 %

## 2014-02-27 NOTE — Progress Notes (Signed)
PCP: Duncan Dull, MD Primary Cardiologist:  Dr Mariah Milling  The patient presents today for routine electrophysiology followup.  I have not seen him in several years.  He has declined substantially since then.  He is no longer ambulatory but remains active by wheelchair.  He continues to go to [facility] basketball games but has not made it to a football game this year.  He has developed a sacral decubiti lesion which is starting to heal.   Today, he denies symptoms of palpitations, chest pain, shortness of breath, orthopnea, PND, lower extremity edema, dizziness, presyncope, syncope, or neurologic sequela.  The patient feels that he is tolerating medications without difficulties and is otherwise without complaint today.   Past Medical History  Diagnosis Date  . BPH (benign prostatic hypertrophy)   . DJD (degenerative joint disease)   . Hx of transient ischemic attack (TIA)   . AAA (abdominal aortic aneurysm)   . Cerebrovascular disease   . HLD (hyperlipidemia)   . HTN (hypertension)   . Second degree AV block, Mobitz type II     s/p PPM  . CAD (coronary artery disease)   . Persistent atrial fibrillation     on pradaxa  . Sleep apnea     cpap machine  . Gastric ulcer, acute 02/2011    multiple - NSAID/Pradaxa thought causative. associated gastroparesis-like problems  . Small bowel obstruction     multiple over the years  . Anemia   . Anemia    Past Surgical History  Procedure Laterality Date  . Pacemaker insertion  01/24/10    Adapta Medtronic   . Coronary artery bypass graft    . Appendectomy    . Gastrectomy    . Knee arthroscopy      right, with medial and lateral meniscal  . Finger surgery       Left middle finger dorsal lesion  . Upper gastrointestinal endoscopy  03/12/2011    stomach ulcer, esophageal erosion  . Esophagogastroduodenoscopy  01/13/2012    Procedure: ESOPHAGOGASTRODUODENOSCOPY (EGD);  Surgeon: Iva Boop, MD;  Location: [facility];  Service: Endoscopy;  Laterality:  N/A;  . Flexible sigmoidoscopy  01/13/2012    Procedure: FLEXIBLE SIGMOIDOSCOPY;  Surgeon: Iva Boop, MD;  Location: [facility];  Service: Endoscopy;  Laterality: N/A;  Fleets enema on arrival    Current Outpatient Prescriptions  Medication Sig Dispense Refill  . aspirin EC 325 MG EC tablet Take 1 tablet (325 mg total) by mouth daily.  30 tablet    . carbidopa-levodopa (SINEMET IR) 25-100 MG per tablet 1/2 in morning 1/2 at night      . Coenzyme Q10 (CO Q-10) 100 MG CAPS Take 100 mg by mouth daily.      . diclofenac sodium (VOLTAREN) 1 % GEL Apply 2 g topically 4 (four) times daily. To hip or knees  4 Tube  11  . enalapril (VASOTEC) 10 MG tablet TAKE ONE AND ONE-HALF TABLETS DAILY  60 tablet  3  . finasteride (PROSCAR) 5 MG tablet Take 1 tablet (5 mg total) by mouth daily.  30 tablet  6  . furosemide (LASIX) 20 MG tablet Take 1 tablet (20 mg total) by mouth daily.  30 tablet  6  . lidocaine (LIDODERM) 5 % APPLY ONE PATCH DAILY (ON 12 HOURS, OFF 12 HOURS)  30 patch  5  . Multiple Vitamin (MULTIVITAMINS PO) Take 1 tablet by mouth daily.        Marland Kitchen NAMENDA 10 MG tablet Take 10 mg  by mouth daily.       Marland Kitchen nystatin cream (MYCOSTATIN) APPLY SPARINGLY AND RUB IN WELL TO AFFECTED AREAS TWICE A DAY  30 g  0  . pantoprazole (PROTONIX) 40 MG tablet TAKE ONE TABLET EVERY MORNING  30 tablet  3  . potassium chloride SA (KLOR-CON M20) 20 MEQ tablet TAKE 1 TABLET EVERY3rd  DAY      . simvastatin (ZOCOR) 20 MG tablet TAKE ONE TABLET EVERY EVENING  30 tablet  6  . VITAMIN D, CHOLECALCIFEROL, PO Take 5,000 Units by mouth daily.       No current facility-administered medications for this visit.    Allergies  Allergen Reactions  . Promethazine Hcl Other (See Comments)    Change in behavior  . Phenergan [Promethazine Hcl] Other (See Comments)    Change in behavior  . Morphine Other (See Comments)    Reaction unknown  . Penicillins Other (See Comments)    Reaction unknown    History   Social  History  . Marital Status: Married    Spouse Name: N/A    Number of Children: N/A  . Years of Education: N/A   Occupational History  . retired    Social History Main Topics  . Smoking status: Former Smoker -- 1.00 packs/day for 5 years    Types: Cigarettes    Quit date: 06/07/1968  . Smokeless tobacco: Never Used  . Alcohol Use: No  . Drug Use: No  . Sexual Activity: Not on file   Other Topics Concern  . Not on file   Social History Narrative  . No narrative on file    Family History  Problem Relation Age of Onset  . Coronary artery disease Father   . Colon cancer Brother    Physical Exam: Filed Vitals:   02/27/14 1231  BP: 132/72  Pulse: 64  Height:  (1.803 m)    GEN- The patient is chronically ill appearing, alert and oriented x 3 today.  In a wheelchair Head- normocephalic, atraumatic Eyes-  Sclera clear, conjunctiva pink Ears- hearing intact Oropharynx- clear Neck- supple  Lungs- Clear to ausculation bilaterally, normal work of breathing Chest- pacemaker pocket is well healed Heart- Regular rate and rhythm, no murmurs, rubs or gallops, PMI not laterally displaced GI- soft, NT, ND, + BS Extremities- no clubbing, cyanosis, or edema  Pacemaker interrogation- reviewed in detail today,  See PACEART report  Assessment and Plan:   1. Permanent atrial fibrillation Not felt to be a candidate for anticoagulation.  Though AVEROES data would suggest that eliquis may be a better alternative than ASA for this patient.  I will defer to Dr Mariah Milling  2. Complete heart block Normal pacemaker function See Pace Art report No changes today  carelink PPM follow-ups Return to see me in 1 year

## 2014-02-27 NOTE — Patient Instructions (Signed)
Your physician wants you to follow-up in: 12 months with Dr Allred You will receive a reminder letter in the mail two months in advance. If you don't receive a letter, please call our office to schedule the follow-up appointment.   Remote monitoring is used to monitor your Pacemaker of ICD from home. This monitoring reduces the number of office visits required to check your device to one time per year. It allows us to keep an eye on the functioning of your device to ensure it is working properly. You are scheduled for a device check from home on 06/03/14. You may send your transmission at any time that day. If you have a wireless device, the transmission will be sent automatically. After your physician reviews your transmission, you will receive a postcard with your next transmission date.  Thank you for choosing  [facility]!!     Gracyn Allor, RN [phone]     

## 2014-03-04 ENCOUNTER — Encounter: Payer: Self-pay | Admitting: Internal Medicine

## 2014-03-07 ENCOUNTER — Encounter: Payer: Self-pay | Admitting: Surgery

## 2014-03-20 ENCOUNTER — Other Ambulatory Visit: Payer: Self-pay | Admitting: Cardiovascular Disease

## 2014-04-07 ENCOUNTER — Encounter: Payer: Self-pay | Admitting: Surgery

## 2014-04-15 ENCOUNTER — Ambulatory Visit: Payer: Medicare Other | Admitting: Internal Medicine

## 2014-04-16 ENCOUNTER — Other Ambulatory Visit: Payer: Self-pay | Admitting: Cardiovascular Disease

## 2014-04-26 ENCOUNTER — Other Ambulatory Visit: Payer: Self-pay | Admitting: Internal Medicine

## 2014-04-26 MED ORDER — LACTULOSE 20 GM/30ML PO SOLN: ORAL | Status: AC

## 2014-05-07 ENCOUNTER — Ambulatory Visit (INDEPENDENT_AMBULATORY_CARE_PROVIDER_SITE_OTHER): Payer: Medicare Other | Admitting: Cardiovascular Disease

## 2014-05-07 ENCOUNTER — Encounter: Payer: Self-pay | Admitting: Cardiovascular Disease

## 2014-05-07 ENCOUNTER — Encounter: Payer: Self-pay | Admitting: Surgery

## 2014-05-07 VITALS — BP 120/80 | HR 69 | Ht 71.0 in | Wt 150.0 lb

## 2014-05-07 DIAGNOSIS — R634 Abnormal weight loss: Secondary | ICD-10-CM

## 2014-05-07 DIAGNOSIS — I4891 Unspecified atrial fibrillation: Secondary | ICD-10-CM

## 2014-05-07 DIAGNOSIS — I10 Essential (primary) hypertension: Secondary | ICD-10-CM

## 2014-05-07 DIAGNOSIS — R5381 Other malaise: Secondary | ICD-10-CM

## 2014-05-07 DIAGNOSIS — Z9989 Dependence on other enabling machines and devices: Secondary | ICD-10-CM

## 2014-05-07 DIAGNOSIS — G4733 Obstructive sleep apnea (adult) (pediatric): Secondary | ICD-10-CM

## 2014-05-07 DIAGNOSIS — I25810 Atherosclerosis of coronary artery bypass graft(s) without angina pectoris: Secondary | ICD-10-CM

## 2014-05-07 MED ORDER — SIMVASTATIN 20 MG PO TABS: ORAL_TABLET | ORAL | Status: AC

## 2014-05-07 MED ORDER — ENALAPRIL MALEATE 10 MG PO TABS: 10.0000 mg | ORAL_TABLET | Freq: Every day | ORAL | Status: AC

## 2014-05-07 NOTE — Progress Notes (Signed)
Patient ID: Eric [name], male    DOB: September 15, 1925, 80 y.o.   MRN: [id number]  HPI Comments:  Mr Eric [name] is a pleasant 80 year old gentleman with history of coronary disease, CABG,  abdominal aortic aneurysm estimated at 3.9 cm, second degree AV block type II with pacemaker, chronic atrial fibrillation not on anticoagulation secondary to high fall risk,  history of falls, walks minimal distances with a walker, relatively sedentary secondary to weak legs who presents for routine followup of his coronary artery disease, hypertension Wife reports some door hematoma 2 years ago October 2013 after a fall He has not been on blood thinners for atrial fibrillation given history of falls  In follow-up today,he is not very verbal, wife does most of the talking. He is been relatively stable, sacral decubitus ulcer, also ulcer on the bridge of his nose from his CPAP. He has been seeing the wound clinic. He is no longer walking, uses a lift to transfer from bed to chair. Does his bathing in the bed. He is not drinking much. No signs of lower extremity edema, no shortness of breath or signs of heart failure. Unable to weigh at home as he is unable to stand without assistance  Not checking the blood pressure at home.  Does not complain about any new symptoms on today's visit   EKG on today's visit shows paced rhythm, rate 69 bpm  Prior hospitalization in October 2014 for a fall. Intracranial bleed at that time.   Past Medical History . BPH (benign prostatic hypertrophy)  . DJD (degenerative joint disease)  . Hx of transient ischemic attack (TIA)  . AAA (abdominal aortic aneurysm)  . Cerebrovascular disease  . HLD (hyperlipidemia)  . HTN (hypertension)  . Second degree AV block, Mobitz type II    s/p PPM . CAD (coronary artery disease)  . Persistent atrial fibrillation  . Sleep apnea    cpap machine . Gastric ulcer, acute 02/2011   multiple - NSAID/Pradaxa thought causative. associated  gastroparesis-like problems . Small bowel obstruction    multiple over the years . Anemia  . Anemia     Outpatient Encounter Prescriptions as of 05/07/2014  Medication Sig  . aspirin EC 325 MG EC tablet Take 1 tablet (325 mg total) by mouth daily.  . carbidopa-levodopa (SINEMET IR) 25-100 MG per tablet 1/2 in morning 1/2 at night  . Coenzyme Q10 (CO Q-10) 100 MG CAPS Take 100 mg by mouth daily.  . diclofenac sodium (VOLTAREN) 1 % GEL Apply 2 g topically 4 (four) times daily. To hip or knees  . finasteride (PROSCAR) 5 MG tablet Take 1 tablet (5 mg total) by mouth daily.  . furosemide (LASIX) 20 MG tablet Take 1 tablet (20 mg total) by mouth daily.  . Lactulose 20 GM/30ML SOLN 30 ml every 4 hours until constipation is relieved  . lidocaine (LIDODERM) 5 % APPLY ONE PATCH DAILY (ON 12 HOURS, OFF 12 HOURS)  . Multiple Vitamin (MULTIVITAMINS PO) Take 1 tablet by mouth daily.    Marland Kitchen. NAMENDA 10 MG tablet Take 10 mg by mouth daily.   Marland Kitchen. nystatin cream (MYCOSTATIN) APPLY SPARINGLY AND RUB IN WELL TO AFFECTED AREAS TWICE A DAY  . pantoprazole (PROTONIX) 40 MG tablet TAKE ONE TABLET EVERY MORNING  . potassium chloride SA (K-DUR,KLOR-CON) 20 MEQ tablet Take 10 mEq by mouth. Take with fluid pill  . simvastatin (ZOCOR) 20 MG tablet TAKE ONE TABLET EVERY EVENING AS NEEDED.  Marland Kitchen. VITAMIN D, CHOLECALCIFEROL, PO Take 5,000  Units by mouth daily.  .  enalapril (VASOTEC) 10 MG tablet TAKE ONE AND ONE-HALF TABLETS DAILY  . simvastatin (ZOCOR) 20 MG tablet TAKE ONE TABLET EVERY EVENING  .  potassium chloride SA (K-DUR,KLOR-CON) 20 MEQ tablet TAKE 1 TABLET EVERY DAY    Social history  reports that he quit smoking about 45 years ago. His smoking use included Cigarettes. He has a 5 pack-year smoking history. He has never used smokeless tobacco. He reports that he does not drink alcohol or use illicit drugs.   Review of Systems  Constitutional: Positive for activity change.  HENT:       Sore on the bridge of his  nose from CPAP mask  Respiratory: Negative.   Cardiovascular: Negative.   Endocrine: Negative.   Musculoskeletal: Positive for gait problem.       Profound leg weakness  Neurological: Negative.   Hematological: Negative.   Psychiatric/Behavioral: Positive for decreased concentration.  All other systems reviewed and are negative.   BP 120/80 mmHg  Pulse 69  Ht 5\' 11"  (1.803 m)  Wt 150 lb (68.04 kg)  BMI 20.93 kg/m2  Physical Exam  Constitutional: He is oriented to person, place, and time. He appears well-developed and well-nourished.  HENT:  Head: Normocephalic.  Nose: Nose normal.  Mouth/Throat: Oropharynx is clear and moist.  Eyes: Conjunctivae are normal. Pupils are equal, round, and reactive to light.  Neck: Normal range of motion. Neck supple. No JVD present.  Cardiovascular: Normal rate, regular rhythm, S1 normal, S2 normal, normal heart sounds and intact distal pulses.  Exam reveals no gallop and no friction rub.   No murmur heard. Pulmonary/Chest: Effort normal and breath sounds normal. No respiratory distress. He has no wheezes. He has no rales. He exhibits no tenderness.  Abdominal: Soft. Bowel sounds are normal. He exhibits no distension. There is no tenderness.  Musculoskeletal: Normal range of motion. He exhibits no edema or tenderness.  Lymphadenopathy:    He has no cervical adenopathy.  Neurological: He is alert and oriented to person, place, and time. Coordination normal.  Skin: Skin is warm and dry. No rash noted. No erythema.  Psychiatric: He has a normal mood and affect. His behavior is normal. Judgment and thought content normal.      Assessment and Plan   Nursing note and vitals reviewed.

## 2014-05-07 NOTE — Assessment & Plan Note (Addendum)
Bp running lower. We will decrease teh enalapril down to 10 mg daily. Suspect recent weight loss.  Suggested that he monitor his blood pressure at home and call our office if this continues to run low. Recommended he check his blood pressure after meals, and a effort to monitor for postprandial hypotension

## 2014-05-07 NOTE — Assessment & Plan Note (Signed)
Suspect his weight is dropping as he is not eating much, not drinking much. We will decrease his Lasix dosing to every other day, even as needed. Also decreased his blood pressure medications as above to accommodate for weight loss

## 2014-05-07 NOTE — Assessment & Plan Note (Signed)
He has a wound on the bridge of his nose from the CPAP. Recommended they try to coat this with Vaseline, loose Band-Aid, more Vaseline over the area for skin protection

## 2014-05-07 NOTE — Assessment & Plan Note (Signed)
Currently with no symptoms of angina. Wife is concerned about the cholesterol medication and possible complications, leg weakness. We'll do a trial without the simvastatin

## 2014-05-07 NOTE — Patient Instructions (Addendum)
  Wean the lasix down to every other day, or as needed if not drinking much Wean the potassium (take only with lasix)  Ok to do a trial off the zocor to see if leg strength improves  Please decrease the enalapril down to one a day  Please call us if you have new issues that need to be addressed before your next appt.  Your physician wants you to follow-up in: 3 months.  You will receive a reminder letter in the mail two months in advance. If you don't receive a letter, please call our office to schedule the follow-up appointment.

## 2014-05-07 NOTE — Assessment & Plan Note (Signed)
Essentially bedbound or requiring full assistance to ambulate. Neurologically also having issues, concerning for Lewy body. Unable to exclude other etiology Wife has an Engineer, productionaide. Trying to manage at home

## 2014-05-14 ENCOUNTER — Other Ambulatory Visit: Payer: Self-pay | Admitting: Cardiovascular Disease

## 2014-05-28 ENCOUNTER — Encounter: Payer: Self-pay | Admitting: *Deleted

## 2014-05-29 ENCOUNTER — Other Ambulatory Visit: Payer: Self-pay | Admitting: Internal Medicine

## 2014-05-29 NOTE — Telephone Encounter (Signed)
Medication has been refilled and sent to pharmacy  

## 2014-05-29 NOTE — Telephone Encounter (Signed)
Last OV 3.31.15.  Please advise refill

## 2014-06-03 ENCOUNTER — Ambulatory Visit (INDEPENDENT_AMBULATORY_CARE_PROVIDER_SITE_OTHER): Payer: Medicare Other | Admitting: *Deleted

## 2014-06-03 DIAGNOSIS — I442 Atrioventricular block, complete: Secondary | ICD-10-CM

## 2014-06-03 DIAGNOSIS — I4891 Unspecified atrial fibrillation: Secondary | ICD-10-CM

## 2014-06-03 DIAGNOSIS — I5032 Chronic diastolic (congestive) heart failure: Secondary | ICD-10-CM

## 2014-06-03 NOTE — Progress Notes (Signed)
Remote pacemaker check. 

## 2014-06-04 LAB — MDC_IDC_ENUM_SESS_TYPE_REMOTE
Battery Impedance: 260 Ohm
Battery Voltage: 2.78 V
Brady Statistic RV Percent Paced: 91 %
Lead Channel Impedance Value: 476 Ohm
Lead Channel Setting Pacing Pulse Width: 0.4 ms
MDC IDC MSMT BATTERY REMAINING LONGEVITY: 118 mo
MDC IDC MSMT LEADCHNL RA IMPEDANCE VALUE: 67 Ohm
MDC IDC MSMT LEADCHNL RV PACING THRESHOLD AMPLITUDE: 0.75 V
MDC IDC MSMT LEADCHNL RV PACING THRESHOLD PULSEWIDTH: 0.4 ms
MDC IDC SESS DTM: 20151228165616
MDC IDC SET LEADCHNL RV PACING AMPLITUDE: 2.5 V
MDC IDC SET LEADCHNL RV SENSING SENSITIVITY: 2.8 mV

## 2014-06-07 ENCOUNTER — Encounter: Payer: Self-pay | Admitting: Surgery

## 2014-06-14 ENCOUNTER — Encounter: Payer: Self-pay | Admitting: *Deleted

## 2014-06-27 ENCOUNTER — Encounter: Payer: Self-pay | Admitting: Internal Medicine

## 2014-07-03 ENCOUNTER — Telehealth: Payer: Self-pay | Admitting: Cardiovascular Disease

## 2014-07-03 NOTE — Telephone Encounter (Signed)
Would go back up on enalapril to 15 mg daily Consider 20 mg daily if needed  Previously on 15 mg daily, blood pressure was running low and we cut back to 10 mg daily Would continue to monitor blood pressure

## 2014-07-03 NOTE — Telephone Encounter (Signed)
Pt c/o BP issue: STAT if pt c/o blurred vision, one-sided weakness or slurred speech  1. What are your last 5 BP readings?  166/25 on 07/01/14 160/108 06/30/14 161/92 06/28/14 164/99 06/27/14 182/95 06/26/14    2. Are you having any other symptoms (ex. Dizziness, headache, blurred vision, passed out)?  He feels all right. He has not had any real noticeable symptoms  3. What is your BP issue? Change in med and wife calling to just see what doctors says to do.

## 2014-07-04 NOTE — Telephone Encounter (Signed)
Spoke w/ pt's wife.  Advised her of Dr. Windell HummingbirdGollan's recommendation. She is agreeable to this and reports that pt has some swelling in his elbow, she is unsure why, but will call Dr. Melina Schoolsullo's office. Advised her to continue to monitor, as pain can increase his BP. Asked her to call back if we can be of further assistance.

## 2014-08-05 ENCOUNTER — Other Ambulatory Visit: Payer: Self-pay | Admitting: Emergency Medicine

## 2014-08-05 DIAGNOSIS — I482 Chronic atrial fibrillation: Secondary | ICD-10-CM

## 2014-08-05 DIAGNOSIS — I429 Cardiomyopathy, unspecified: Secondary | ICD-10-CM

## 2014-08-05 DIAGNOSIS — I340 Nonrheumatic mitral (valve) insufficiency: Secondary | ICD-10-CM | POA: Diagnosis not present

## 2014-08-05 DIAGNOSIS — I10 Essential (primary) hypertension: Secondary | ICD-10-CM

## 2014-08-05 DIAGNOSIS — J9601 Acute respiratory failure with hypoxia: Secondary | ICD-10-CM | POA: Diagnosis not present

## 2014-08-06 ENCOUNTER — Ambulatory Visit: Payer: Medicare Other | Admitting: Cardiovascular Disease

## 2014-08-06 DIAGNOSIS — I482 Chronic atrial fibrillation: Secondary | ICD-10-CM | POA: Diagnosis not present

## 2014-08-06 DIAGNOSIS — I509 Heart failure, unspecified: Secondary | ICD-10-CM

## 2014-08-06 DIAGNOSIS — I429 Cardiomyopathy, unspecified: Secondary | ICD-10-CM | POA: Diagnosis not present

## 2014-08-06 DIAGNOSIS — I10 Essential (primary) hypertension: Secondary | ICD-10-CM | POA: Diagnosis not present

## 2014-08-06 DIAGNOSIS — J9601 Acute respiratory failure with hypoxia: Secondary | ICD-10-CM | POA: Diagnosis not present

## 2014-08-07 DIAGNOSIS — J9601 Acute respiratory failure with hypoxia: Secondary | ICD-10-CM | POA: Diagnosis not present

## 2014-08-07 DIAGNOSIS — I482 Chronic atrial fibrillation: Secondary | ICD-10-CM | POA: Diagnosis not present

## 2014-08-07 DIAGNOSIS — I10 Essential (primary) hypertension: Secondary | ICD-10-CM | POA: Diagnosis not present

## 2014-08-07 DIAGNOSIS — I429 Cardiomyopathy, unspecified: Secondary | ICD-10-CM | POA: Diagnosis not present

## 2014-08-08 DIAGNOSIS — I5021 Acute systolic (congestive) heart failure: Secondary | ICD-10-CM | POA: Diagnosis not present

## 2014-08-09 DIAGNOSIS — I10 Essential (primary) hypertension: Secondary | ICD-10-CM | POA: Diagnosis not present

## 2014-08-09 DIAGNOSIS — I429 Cardiomyopathy, unspecified: Secondary | ICD-10-CM | POA: Diagnosis not present

## 2014-08-09 DIAGNOSIS — J9601 Acute respiratory failure with hypoxia: Secondary | ICD-10-CM | POA: Diagnosis not present

## 2014-08-09 DIAGNOSIS — I482 Chronic atrial fibrillation: Secondary | ICD-10-CM | POA: Diagnosis not present

## 2014-08-10 DIAGNOSIS — I5021 Acute systolic (congestive) heart failure: Secondary | ICD-10-CM | POA: Diagnosis not present

## 2014-08-12 ENCOUNTER — Encounter: Payer: Self-pay | Admitting: Physician Assistant

## 2014-08-12 DIAGNOSIS — I482 Chronic atrial fibrillation, unspecified: Secondary | ICD-10-CM | POA: Insufficient documentation

## 2014-08-12 DIAGNOSIS — S065X9A Traumatic subdural hemorrhage with loss of consciousness of unspecified duration, initial encounter: Secondary | ICD-10-CM | POA: Insufficient documentation

## 2014-08-12 DIAGNOSIS — F0390 Unspecified dementia without behavioral disturbance: Secondary | ICD-10-CM | POA: Insufficient documentation

## 2014-08-12 DIAGNOSIS — I5022 Chronic systolic (congestive) heart failure: Secondary | ICD-10-CM | POA: Insufficient documentation

## 2014-08-12 DIAGNOSIS — S065XAA Traumatic subdural hemorrhage with loss of consciousness status unknown, initial encounter: Secondary | ICD-10-CM | POA: Insufficient documentation

## 2014-08-12 DIAGNOSIS — Z9181 History of falling: Secondary | ICD-10-CM | POA: Insufficient documentation

## 2014-09-06 DEATH — deceased

## 2014-12-02 ENCOUNTER — Other Ambulatory Visit: Payer: Self-pay

## 2017-03-02 NOTE — Telephone Encounter (Signed)
See care note
